# Patient Record
Sex: Male | Born: 1960
Health system: Southern US, Community
[De-identification: ages and names within clinical notes are randomized; demographics above are authoritative.]

## PROBLEM LIST (undated history)

## (undated) DIAGNOSIS — R0683 Snoring: Secondary | ICD-10-CM

## (undated) DIAGNOSIS — K219 Gastro-esophageal reflux disease without esophagitis: Secondary | ICD-10-CM

## (undated) DIAGNOSIS — M25562 Pain in left knee: Secondary | ICD-10-CM

## (undated) DIAGNOSIS — I10 Essential (primary) hypertension: Secondary | ICD-10-CM

## (undated) DIAGNOSIS — D649 Anemia, unspecified: Secondary | ICD-10-CM

## (undated) DIAGNOSIS — M2550 Pain in unspecified joint: Secondary | ICD-10-CM

## (undated) DIAGNOSIS — T7840XA Allergy, unspecified, initial encounter: Secondary | ICD-10-CM

## (undated) DIAGNOSIS — L723 Sebaceous cyst: Secondary | ICD-10-CM

## (undated) DIAGNOSIS — E785 Hyperlipidemia, unspecified: Secondary | ICD-10-CM

## (undated) DIAGNOSIS — Z9109 Other allergy status, other than to drugs and biological substances: Secondary | ICD-10-CM

## (undated) DIAGNOSIS — M25561 Pain in right knee: Secondary | ICD-10-CM

## (undated) DIAGNOSIS — M549 Dorsalgia, unspecified: Secondary | ICD-10-CM

## (undated) HISTORY — PX: KNEE SURGERY: SHX244

## (undated) HISTORY — DX: Pain in left knee: M25.562

## (undated) HISTORY — DX: Hyperlipidemia, unspecified: E78.5

## (undated) HISTORY — PX: UPPER GASTROINTESTINAL ENDOSCOPY: SHX188

## (undated) HISTORY — DX: Anemia, unspecified: D64.9

## (undated) HISTORY — DX: Dorsalgia, unspecified: M54.9

## (undated) HISTORY — DX: Gastro-esophageal reflux disease without esophagitis: K21.9

## (undated) HISTORY — DX: Pain in right knee: M25.561

## (undated) HISTORY — PX: APPENDECTOMY: SHX54

## (undated) HISTORY — DX: Allergy, unspecified, initial encounter: T78.40XA

## (undated) HISTORY — DX: Pain in unspecified joint: M25.50

## (undated) HISTORY — DX: Other allergy status, other than to drugs and biological substances: Z91.09

## (undated) HISTORY — DX: Sebaceous cyst: L72.3

---

## 1997-09-01 ENCOUNTER — Emergency Department (HOSPITAL_COMMUNITY): Admission: EM | Admit: 1997-09-01 | Discharge: 1997-09-01 | Payer: Self-pay | Admitting: Emergency Medicine

## 1997-09-10 ENCOUNTER — Emergency Department (HOSPITAL_COMMUNITY): Admission: EM | Admit: 1997-09-10 | Discharge: 1997-09-10 | Payer: Self-pay | Admitting: Emergency Medicine

## 1997-09-27 ENCOUNTER — Encounter: Admission: RE | Admit: 1997-09-27 | Discharge: 1997-12-26 | Payer: Self-pay | Admitting: *Deleted

## 1998-02-15 ENCOUNTER — Emergency Department (HOSPITAL_COMMUNITY): Admission: EM | Admit: 1998-02-15 | Discharge: 1998-02-15 | Payer: Self-pay

## 1999-06-08 ENCOUNTER — Emergency Department (HOSPITAL_COMMUNITY): Admission: EM | Admit: 1999-06-08 | Discharge: 1999-06-09 | Payer: Self-pay | Admitting: Emergency Medicine

## 1999-06-13 ENCOUNTER — Encounter: Payer: Self-pay | Admitting: Emergency Medicine

## 1999-06-13 ENCOUNTER — Emergency Department (HOSPITAL_COMMUNITY): Admission: EM | Admit: 1999-06-13 | Discharge: 1999-06-13 | Payer: Self-pay

## 1999-06-22 ENCOUNTER — Emergency Department (HOSPITAL_COMMUNITY): Admission: EM | Admit: 1999-06-22 | Discharge: 1999-06-22 | Payer: Self-pay | Admitting: Emergency Medicine

## 1999-06-22 ENCOUNTER — Encounter: Payer: Self-pay | Admitting: Emergency Medicine

## 2001-12-26 ENCOUNTER — Emergency Department (HOSPITAL_COMMUNITY): Admission: EM | Admit: 2001-12-26 | Discharge: 2001-12-26 | Payer: Self-pay | Admitting: Emergency Medicine

## 2002-01-01 HISTORY — PX: SPINE SURGERY: SHX786

## 2002-03-16 ENCOUNTER — Encounter: Payer: Self-pay | Admitting: Neurosurgery

## 2002-03-16 ENCOUNTER — Inpatient Hospital Stay (HOSPITAL_COMMUNITY): Admission: EM | Admit: 2002-03-16 | Discharge: 2002-03-18 | Payer: Self-pay | Admitting: Emergency Medicine

## 2002-05-24 ENCOUNTER — Emergency Department (HOSPITAL_COMMUNITY): Admission: EM | Admit: 2002-05-24 | Discharge: 2002-05-24 | Payer: Self-pay | Admitting: *Deleted

## 2002-05-25 ENCOUNTER — Emergency Department (HOSPITAL_COMMUNITY): Admission: EM | Admit: 2002-05-25 | Discharge: 2002-05-25 | Payer: Self-pay | Admitting: Emergency Medicine

## 2002-07-25 ENCOUNTER — Encounter: Payer: Self-pay | Admitting: Emergency Medicine

## 2002-07-25 ENCOUNTER — Emergency Department (HOSPITAL_COMMUNITY): Admission: EM | Admit: 2002-07-25 | Discharge: 2002-07-25 | Payer: Self-pay | Admitting: Emergency Medicine

## 2002-08-14 ENCOUNTER — Emergency Department (HOSPITAL_COMMUNITY): Admission: EM | Admit: 2002-08-14 | Discharge: 2002-08-14 | Payer: Self-pay | Admitting: Emergency Medicine

## 2002-08-15 ENCOUNTER — Encounter: Payer: Self-pay | Admitting: Emergency Medicine

## 2002-08-17 ENCOUNTER — Encounter: Payer: Self-pay | Admitting: Surgery

## 2002-08-17 ENCOUNTER — Ambulatory Visit (HOSPITAL_COMMUNITY): Admission: RE | Admit: 2002-08-17 | Discharge: 2002-08-17 | Payer: Self-pay | Admitting: Surgery

## 2002-08-27 ENCOUNTER — Encounter: Payer: Self-pay | Admitting: *Deleted

## 2002-08-27 ENCOUNTER — Emergency Department (HOSPITAL_COMMUNITY): Admission: EM | Admit: 2002-08-27 | Discharge: 2002-08-27 | Payer: Self-pay | Admitting: Emergency Medicine

## 2003-02-11 ENCOUNTER — Emergency Department (HOSPITAL_COMMUNITY): Admission: EM | Admit: 2003-02-11 | Discharge: 2003-02-12 | Payer: Self-pay | Admitting: Emergency Medicine

## 2003-03-15 ENCOUNTER — Emergency Department (HOSPITAL_COMMUNITY): Admission: EM | Admit: 2003-03-15 | Discharge: 2003-03-16 | Payer: Self-pay | Admitting: Emergency Medicine

## 2008-06-23 ENCOUNTER — Ambulatory Visit (HOSPITAL_COMMUNITY): Admission: RE | Admit: 2008-06-23 | Discharge: 2008-06-23 | Payer: Self-pay | Admitting: Gastroenterology

## 2009-05-05 ENCOUNTER — Encounter: Admission: RE | Admit: 2009-05-05 | Discharge: 2009-05-05 | Payer: Self-pay | Admitting: Family Medicine

## 2009-05-12 ENCOUNTER — Encounter: Admission: RE | Admit: 2009-05-12 | Discharge: 2009-05-12 | Payer: Self-pay | Admitting: Neurosurgery

## 2009-05-26 ENCOUNTER — Encounter: Admission: RE | Admit: 2009-05-26 | Discharge: 2009-05-26 | Payer: Self-pay | Admitting: Neurosurgery

## 2010-05-19 NOTE — H&P (Signed)
NAME:  Matthew Garner, Matthew Garner NO.:  [id number]   MEDICAL RECORD NO.:  [id number]                   PATIENT TYPE:  INP   LOCATION:  1831                                 FACILITY:  [facility]   PHYSICIAN:  Hewitt Shorts, M.D.            DATE OF BIRTH:  01-Jul-1960   DATE OF ADMISSION:  03/16/2002  DATE OF DISCHARGE:                                HISTORY & PHYSICAL   HISTORY OF PRESENT ILLNESS:  The patient is a 50 year old right-handed man  who was sent by [facility] to the emergency room for  evaluation of a right L4-5 lumbar disk herniation.   The patient reports that he injured his back in 1999 where he was treated  apparently in the [facility] Emergency Room.  Two months ago he had  pain develop into his right lower extremity. The pain has persisted and  become incapacitating.  He was undergoing evaluation last week at [facility].  He was given a Medrol prescription and  underwent MRI scan which revealed a right L4-5 lumbar disk herniation.  He  was in the office today seeing Dr. Kevan Ny and she had him transferred to the  [facility] emergency room by ambulance for evaluation.  The patient  was seen by Dr. Carren Rang, who requested neurosurgical consultation.  The patient complains of pain in the right side of his low back extending  into the lateral right thigh.  He describes cramping in the right lower  extremity musculature, numbness in his right testicle.   PAST MEDICAL HISTORY:  No history of hypertension, myocardial infarction,  cancer, stroke, diabetes, peptic ulcer disease, or lung disease.   PAST SURGICAL HISTORY:  He underwent an appendectomy in 1982.   MEDICATIONS:  He denies outside medications.  He is on no regular  medications but was prescribed medications last week including Medrol.   FAMILY HISTORY:  His father died at age 55, he really is unsure of the  causes.  His  mother is healthy at age 57.   SOCIAL HISTORY:  The patient is from [place].  He was married last summer,  but his wife remains in [place].  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  He does not smoke or drink alcohol beverages.   REVIEW OF SYSTEMS:  Review of systems is notable for that described in the  history of present illness and past medical history, but is otherwise  unremarkable.   PHYSICAL EXAMINATION:  GENERAL:  The patient is a somewhat obese man in  obvious pain and discomfort.  VITAL SIGNS:  Temperature 97.3, pulse 54, blood pressure 100/52, respiratory  rate 24.  LUNGS:  Clear to auscultation.  He has symmetric respiratory excursion.  HEART:  Regular rate and rhythm.  Normal S1 and S2.  There are no murmurs.  ABDOMEN:  Soft, nondistended.  Bowel sounds are present.  EXTREMITIES:  Extremity examination shows no clubbing, cyanosis, or edema.  MUSCULOSKELETAL:  Examination shows significant tenderness of the patient  diffusely in the lumbar region.  There is not any specific point tenderness.  Mobility in the lumbar spine was not testable due to the extent of his  discomfort.  NEUROLOGIC:  Examination shows a motor exam with 5/5 strength with  dorsiflexion, plantar flexion in the extensor hallicis longus bilaterally.  Sensation is intact to pinprick in the lower extremities.  Reflexes are  minimal in the quadriceps and gastrocnemius; they are symmetrical  bilaterally.  Toes are downgoing bilaterally.  Gait and stance were not  testable.   LABORATORY DATA:  Report of his MRI describes right L4-5 lumbar disk  herniation, unfortunately the films are not yet available.   IMPRESSION:  Acute right lumbar radiculopathy to a right L4-5 lumbar disk  herniation.   PLAN:  The patient will be admitted.  He was discussed alternatives of  continued symptomatic treatment versus surgical intervention.  Clearly he is  disabled by this and wishes to go ahead with surgery.  We  have recommended a  right L4-5 lumbar laminotomy and microdiskectomy.  It is discussed with both  the patient and his friend, Amiel, who was present.  The nature of the  procedure, the nature of his condition and the indications for surgery.  We  discussed given this surgery, his hospitalization and recuperation, the  limitations postoperatively and risks for infection, bleeding, possibility  of a transfusion, the risk of nerve dysfunction, pain, weakness, numbness,  and paresthesias.  The risk of recurrence of disk fractures possibly from  the surgery, and anesthetic risks of myocardial infarction, stroke,  pneumonia and death.  Having discussed all of this he wishes to go ahead  with surgery and is admitted for such.                                               Hewitt Shorts, M.D.    RWN/MEDQ  D:  03/16/2002  T:  03/16/2002  Job:  [id number]

## 2010-05-19 NOTE — Discharge Summary (Signed)
   NAME:  Matthew Garner, Matthew Garner NO.:  [id number]   MEDICAL RECORD NO.:  [id number]                   PATIENT TYPE:  INP   LOCATION:  3041                                 FACILITY:  [facility]   PHYSICIAN:  Hewitt Shorts, M.D.            DATE OF BIRTH:  April 12, 1960   DATE OF ADMISSION:  03/16/2002  DATE OF DISCHARGE:  03/18/2002                                 DISCHARGE SUMMARY   <.ADMISSION HISTORY AND PHYSICAL EXAMINATION/>  The patient is a 50 year old man who had presented with an acute right  lumbar radiculopathy secondary to a large right L4-5 lumbar disk herniation.  Decision was made to proceed with elective laminotomy and microdiskectomy.   The patient was seen in the emergency room.  His symptoms had begun two  months earlier and had become increasingly incapacitating with low back pain  extending into the right thigh and testicle.  General examination was  unremarkable other than for tenderness in the low back region.  His strength  was intact.   HOSPITAL COURSE:  .  The patient was admitted and taken to surgery for a right L4-5 lumbar  laminotomy and microdiskectomy.  He has done well following surgery with  excellent relief in his radicular pain.  His wound is healing well.  He is  afebrile.  He is up and ambulating in the halls and he is being discharged  to home with instructions regarding wound care and activity.   FOLLOWUP:  He is to return to my office in three weeks for followup or  sooner if he has increased difficulties.   DISCHARGE MEDICATIONS:  He has been given a prescription for Vicodin one to  two tablets p.o. q.i.d. p.r.n. pain -- 30 tablets were prescribed, no  refills -- and also was advised to use Aleve two tablets b.i.d. for the  first week and then p.r.n. thereafter.   DISCHARGE DIAGNOSES:  1. Lumbar disk herniation.  2. Lumbar spondylosis.  3. Lumbar degenerative disk disease.  4. Lumbar radiculopathy.                                    Hewitt Shorts, M.D.    RWN/MEDQ  D:  03/18/2002  T:  03/20/2002  Job:  [id number]

## 2010-05-19 NOTE — Op Note (Signed)
NAME:  Matthew Garner, PRESSLEY NO.:  [id number]   MEDICAL RECORD NO.:  [id number]                   PATIENT TYPE:  INP   LOCATION:  3041                                 FACILITY:  [facility]   PHYSICIAN:  Hewitt Shorts, M.D.            DATE OF BIRTH:  Mar 22, 1960   DATE OF PROCEDURE:  03/16/2002  DATE OF DISCHARGE:                                 OPERATIVE REPORT   PREOPERATIVE DIAGNOSIS:  Right L4-5 lumbar disk herniation, degenerative  disk disease, spondylosis and radiculopathy.   POSTOPERATIVE DIAGNOSIS:  Right L4-5 lumbar disk herniation, degenerative  disk disease, spondylosis and radiculopathy.   OPERATION PERFORMED:  Right L4-5 lumbar laminotomy and microdiskectomy with  microdissection.   SURGEON:  Hewitt Shorts, M.D.   ANESTHESIA:  General endotracheal.   INDICATIONS FOR PROCEDURE:  The patient is a 50 year old man who presented  with an acute right lumbar radiculopathy and was found by MRI scan to have a  large right L4-5 lumbar disk herniation.  Decision made to proceed with  elective laminotomy and microdiskectomy.   DESCRIPTION OF PROCEDURE:  The patient was brought to the operating room and  placed under general endotracheal anesthesia.  Patient was turned to a prone  position, lumbar region was prepped with Betadine soap and solution and  draped in sterile fashion.  The midline was infiltrated with local  anesthetic with epinephrine.  An x-ray was taken and the L4-5 level  identified and then a midline incision made and carried down to the  subcutaneous tissue.  Bipolar cautery and electrocautery were used to  maintain hemostasis.  Dissection was carried down to the lumbar fascia which  was incised on the right side of the midline and the paraspinal muscles  dissected from the spinous process and lamina in subperiosteal fashion.  The  L4-5 interlaminar space was identified and an x-ray was taken to confirm the  localization and  then the laminotomy was performed using the [facility] Max drill  and Kerrison punches.  The operating microscope was draped and brought into  the field to provide additional magnification, illumination and  visualization and the remainder of the diskectomy was performed using  microdissection and microsurgical technique.   The ligamentum flavum was carefully removed and the underlying thecal sac  and the right L5 nerve root were identified.  These structures were gently  retracted medially exposing the large disk herniation.  The diskectomy was  begun with removal of a fragment of disk pressing directly on the nerve root  and we were then able to enter into the disk space and proceed with a  thorough diskectomy removing all loose fragments of disk material from both  the epidural and the disk space and thereby decompressing the thecal sac and  nerve root.  Once the diskectomy was completed and all loose fragments were  removed and the structures decompressed, the wound was  irrigated with  antibiotic solution and checked for hemostasis which was established with  the use of Gelfoam soaked in thrombin.  All Gelfoam was removed prior to  closure.  Once hemostasis was established, 2 ml of fentanyl and 80 mg of  Depo-Medrol were infused into the epidural space and then we proceeded with  closure.  The deep fascia was closed with interrupted undyed 1 Vicryl  suture, the subcutaneous and subcuticular layer closed using inverted  interrupted 2-0 undyed Vicryl sutures and 4-0 undyed Vicryl sutures and then  the skin edges were approximated with Dermabond.  The patient tolerated the procedure well.  The estimated blood loss was 75  cc.  Sponge, needle and instrument counts were correct.  Following surgery,  the patient was turned back to a supine position to be reversed from  anesthetic, extubated and transferred to recovery room for further care.                                                 Hewitt Shorts, M.D.    RWN/MEDQ  D:  03/16/2002  T:  03/17/2002  Job:  [id number]

## 2010-12-12 ENCOUNTER — Ambulatory Visit
Admission: RE | Admit: 2010-12-12 | Discharge: 2010-12-12 | Disposition: A | Discharge status: Home / Self Care | Payer: Worker's Compensation | Source: Ambulatory Visit | Attending: Internal Medicine | Admitting: Internal Medicine

## 2010-12-12 ENCOUNTER — Other Ambulatory Visit: Payer: Self-pay | Admitting: Internal Medicine

## 2010-12-12 ENCOUNTER — Other Ambulatory Visit: Payer: Self-pay

## 2010-12-12 DIAGNOSIS — M25572 Pain in left ankle and joints of left foot: Secondary | ICD-10-CM

## 2011-01-09 ENCOUNTER — Ambulatory Visit (INDEPENDENT_AMBULATORY_CARE_PROVIDER_SITE_OTHER): Payer: 59 | Admitting: Emergency Medicine

## 2011-01-09 DIAGNOSIS — E119 Type 2 diabetes mellitus without complications: Secondary | ICD-10-CM

## 2011-01-09 DIAGNOSIS — E782 Mixed hyperlipidemia: Secondary | ICD-10-CM

## 2011-02-02 ENCOUNTER — Emergency Department (HOSPITAL_COMMUNITY)
Admission: EM | Admit: 2011-02-02 | Discharge: 2011-02-02 | Disposition: A | Discharge status: Home / Self Care | Payer: No Typology Code available for payment source | Attending: Emergency Medicine | Admitting: Emergency Medicine

## 2011-02-02 ENCOUNTER — Emergency Department (HOSPITAL_COMMUNITY): Payer: No Typology Code available for payment source

## 2011-02-02 ENCOUNTER — Encounter (HOSPITAL_COMMUNITY): Payer: Self-pay | Admitting: Family Medicine

## 2011-02-02 DIAGNOSIS — F411 Generalized anxiety disorder: Secondary | ICD-10-CM | POA: Insufficient documentation

## 2011-02-02 DIAGNOSIS — E119 Type 2 diabetes mellitus without complications: Secondary | ICD-10-CM | POA: Insufficient documentation

## 2011-02-02 DIAGNOSIS — Y92410 Unspecified street and highway as the place of occurrence of the external cause: Secondary | ICD-10-CM | POA: Insufficient documentation

## 2011-02-02 DIAGNOSIS — R110 Nausea: Secondary | ICD-10-CM | POA: Insufficient documentation

## 2011-02-02 DIAGNOSIS — T1490XA Injury, unspecified, initial encounter: Secondary | ICD-10-CM | POA: Insufficient documentation

## 2011-02-02 DIAGNOSIS — R51 Headache: Secondary | ICD-10-CM | POA: Insufficient documentation

## 2011-02-02 DIAGNOSIS — L723 Sebaceous cyst: Secondary | ICD-10-CM | POA: Insufficient documentation

## 2011-02-02 DIAGNOSIS — R42 Dizziness and giddiness: Secondary | ICD-10-CM | POA: Insufficient documentation

## 2011-02-02 MED ORDER — LORAZEPAM 1 MG PO TABS
1.0000 mg | ORAL_TABLET | Freq: Once | ORAL | Status: AC
  Administered 2011-02-02: 1 mg via ORAL
  Filled 2011-02-02: qty 1

## 2011-02-02 MED ORDER — OXYCODONE-ACETAMINOPHEN 5-325 MG PO TABS
1.0000 | ORAL_TABLET | Freq: Once | ORAL | Status: AC
  Administered 2011-02-02: 1 via ORAL
  Filled 2011-02-02: qty 1

## 2011-02-02 MED ORDER — ONDANSETRON 8 MG PO TBDP
8.0000 mg | ORAL_TABLET | Freq: Once | ORAL | Status: AC
  Administered 2011-02-02: 8 mg via ORAL
  Filled 2011-02-02: qty 1

## 2011-02-02 MED ORDER — DIAZEPAM 5 MG PO TABS: 5.0000 mg | ORAL_TABLET | Freq: Two times a day (BID) | ORAL | Status: AC
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
## 2011-02-02 NOTE — ED Notes (Signed)
Pt returned from CT °

## 2011-02-03 NOTE — ED Provider Notes (Signed)
Medical screening examination/treatment/procedure(s) were conducted as a shared visit with non-physician practitioner(s) and myself.  I personally evaluated the patient during the encounter   Nael Petrosyan, MD 02/03/11 0130 

## 2011-03-22 ENCOUNTER — Other Ambulatory Visit: Payer: Self-pay | Admitting: Family Medicine

## 2011-03-22 MED ORDER — METFORMIN HCL 500 MG PO TABS: 500.0000 mg | ORAL_TABLET | Freq: Every day | ORAL | Status: DC

## 2011-05-01 ENCOUNTER — Ambulatory Visit (INDEPENDENT_AMBULATORY_CARE_PROVIDER_SITE_OTHER): Payer: 59 | Admitting: Emergency Medicine

## 2011-05-01 ENCOUNTER — Encounter: Payer: Self-pay | Admitting: Emergency Medicine

## 2011-05-01 VITALS — BP 109/73 | HR 67 | Temp 97.7°F | Resp 16 | Ht 63.5 in | Wt 275.4 lb

## 2011-05-01 LAB — POCT URINALYSIS DIPSTICK
Bilirubin, UA: NEGATIVE
Blood, UA: NEGATIVE
Ketones, UA: NEGATIVE
Leukocytes, UA: NEGATIVE
Spec Grav, UA: >=1.03
pH, UA: 5.5

## 2011-05-01 LAB — GLUCOSE, POCT (MANUAL RESULT ENTRY): POC Glucose: 100

## 2011-05-01 MED ORDER — METFORMIN HCL 500 MG PO TABS: 500.0000 mg | ORAL_TABLET | Freq: Every day | ORAL | Status: DC

## 2011-05-01 NOTE — Progress Notes (Signed)
  Subjective:    Patient ID: Matthew Garner, male    DOB: 1960-09-22, 51 y.o.   MRN: [id number]  HPI this is a followup visit for an overweight [place] patient in for followup of his diabetes. He continues to gain weight. He has been taking his diabetes medication but does not check his sugars at home. He is not exercising.    Review of Systems he has no chest pain no problems with his feet no new symptoms have developed.     Objective:   Physical Exam HEENT exam is unremarkable. His neck is supple. Chest is clear. Heart regular rate no murmurs rubs or gallops. His feet are normal with normal sensation and pulses.        Results for orders placed in visit on 05/01/11  GLUCOSE, POCT (MANUAL RESULT ENTRY)      Component Value Range   POC Glucose 100    POCT GLYCOSYLATED HEMOGLOBIN (HGB A1C)      Component Value Range   Hemoglobin A1C 6.2     Assessment & Plan:  His sugar and blood pressure are both good. Dilated eyes and start exercising and get his weight down and be more diligent about his diet

## 2011-05-04 ENCOUNTER — Telehealth: Payer: Self-pay

## 2011-05-04 MED ORDER — METFORMIN HCL 500 MG PO TABS: 500.0000 mg | ORAL_TABLET | Freq: Every day | ORAL | Status: DC

## 2011-05-04 NOTE — Telephone Encounter (Signed)
.  [facility] The patient called asking if his Metformin Rx had been sent to [facility] on [address].  The system does not show that medication was sent. Please review and call patient at [phone].

## 2011-05-04 NOTE — Telephone Encounter (Signed)
Please advise patient that I have sent the Rx to [facility].  Dr. Cleta Alberts had rx'd it, but sent it to [facility].

## 2011-05-05 NOTE — Telephone Encounter (Signed)
LMOM THAT RX WAS SENT INTO [facility]

## 2011-05-06 ENCOUNTER — Ambulatory Visit (INDEPENDENT_AMBULATORY_CARE_PROVIDER_SITE_OTHER): Payer: 59 | Admitting: Emergency Medicine

## 2011-05-06 VITALS — BP 113/72 | HR 62 | Temp 98.2°F | Resp 20 | Ht 64.0 in | Wt 275.0 lb

## 2011-05-06 DIAGNOSIS — N509 Disorder of male genital organs, unspecified: Secondary | ICD-10-CM

## 2011-05-06 DIAGNOSIS — N5089 Other specified disorders of the male genital organs: Secondary | ICD-10-CM

## 2011-05-06 DIAGNOSIS — R361 Hematospermia: Secondary | ICD-10-CM

## 2011-05-06 LAB — POCT CBC
Granulocyte percent: 56.6 %G (ref 37–80)
HCT, POC: 44.1 % (ref 43.5–53.7)
MCH, POC: 28.4 pg (ref 27–31.2)
MPV: 8.9 fL (ref 0–99.8)
POC Granulocyte: 3.2 (ref 2–6.9)
POC MID %: 7.4 %M (ref 0–12)
Platelet Count, POC: 307 10*3/uL (ref 142–424)
RBC: 5.03 M/uL (ref 4.69–6.13)

## 2011-05-06 LAB — PSA: PSA: 1.01 ng/mL (ref <=4.00)

## 2011-05-06 LAB — POCT UA - MICROSCOPIC ONLY
Casts, Ur, LPF, POC: NEGATIVE
Mucus, UA: NEGATIVE
Yeast, UA: NEGATIVE

## 2011-05-06 MED ORDER — DOXYCYCLINE HYCLATE 100 MG PO TABS: 100.0000 mg | ORAL_TABLET | Freq: Two times a day (BID) | ORAL | Status: AC

## 2011-05-06 NOTE — Progress Notes (Signed)
  Subjective:    Patient ID: Matthew Garner, male    DOB: 13-May-1960, 51 y.o.   MRN: [id number]  HPI patient was having intercourse with his wife last night and he developed a severe pain in his genitals and prostate area. When he ejaculated he saw a good amount blood in the ejaculate he then noticed blood coming from his meatus. He has no history of prostate problems. He denies any burning stinging or pain on urination    Review of Systems noncontributory except as relates to the present illness .     Objective:   Physical Exam examination of the genitals reveals normal penis. Testicles are descended no masses are felt. There is no hernia palpable. Rectal exam reveals a normal size prostate which is slightly tender        Assessment & Plan:  I suspect the patient ruptured a small blood vessel at the time of intercourse. He subsequently had an episode of hematospermia. We'll check a PSA urine. No STD checks. Patient has been with the same partner for 10 years.

## 2011-05-10 ENCOUNTER — Other Ambulatory Visit: Payer: Self-pay | Admitting: Emergency Medicine

## 2011-05-10 ENCOUNTER — Telehealth: Payer: Self-pay

## 2011-05-10 DIAGNOSIS — J45909 Unspecified asthma, uncomplicated: Secondary | ICD-10-CM

## 2011-05-10 MED ORDER — ALBUTEROL SULFATE HFA 108 (90 BASE) MCG/ACT IN AERS: 2.0000 | INHALATION_SPRAY | Freq: Four times a day (QID) | RESPIRATORY_TRACT | Status: DC | PRN

## 2011-05-10 MED ORDER — FLUTICASONE PROPIONATE 50 MCG/ACT NA SUSP: 2.0000 | Freq: Every day | NASAL | Status: DC

## 2011-05-10 NOTE — Telephone Encounter (Signed)
Pt calling to get lab results please call [phone]

## 2011-05-10 NOTE — Telephone Encounter (Signed)
Spoke with patient and let him know that labs were normal.

## 2011-08-28 ENCOUNTER — Ambulatory Visit (INDEPENDENT_AMBULATORY_CARE_PROVIDER_SITE_OTHER): Payer: 59 | Admitting: Emergency Medicine

## 2011-08-28 VITALS — BP 109/70 | HR 61 | Temp 97.9°F | Resp 16 | Ht 64.0 in | Wt 272.0 lb

## 2011-08-28 DIAGNOSIS — E669 Obesity, unspecified: Secondary | ICD-10-CM

## 2011-08-28 DIAGNOSIS — E119 Type 2 diabetes mellitus without complications: Secondary | ICD-10-CM

## 2011-08-28 LAB — GLUCOSE, POCT (MANUAL RESULT ENTRY): POC Glucose: 99 mg/dl (ref 70–99)

## 2011-08-28 MED ORDER — METFORMIN HCL 500 MG PO TABS: 500.0000 mg | ORAL_TABLET | Freq: Two times a day (BID) | ORAL | Status: DC

## 2011-08-28 MED ORDER — METFORMIN HCL 500 MG PO TABS: 500.0000 mg | ORAL_TABLET | Freq: Every day | ORAL | Status: DC

## 2011-08-28 NOTE — Progress Notes (Signed)
  Subjective:    Patient ID: Maximiano Lott, male    DOB: November 02, 1960, 51 y.o.   MRN: [id number]  HPI patient comes for followup of his diabetes. Apparently he has a very poor diet and has continued eat without regard to his diabetes. He overall feels well and continues to work his regular job    Review of Systems     Objective:   Physical Exam  Constitutional: He appears well-developed.  HENT:  Head: Normocephalic.  Eyes: Pupils are equal, round, and reactive to light.  Cardiovascular: Normal rate and regular rhythm.   Pulmonary/Chest: Effort normal and breath sounds normal.  Abdominal: Soft. There is no tenderness. There is no rebound.  Musculoskeletal: He exhibits no edema.  Neurological: He is alert. He displays normal reflexes.  Psychiatric: He has a normal mood and affect.   Results for orders placed in visit on 08/28/11  GLUCOSE, POCT (MANUAL RESULT ENTRY)      Component Value Range   POC Glucose 99  70 - 99 mg/dl  POCT GLYCOSYLATED HEMOGLOBIN (HGB A1C)      Component Value Range   Hemoglobin A1C 6.4            Assessment & Plan:   His sugar is not at goal. I increased his metformen to BID and told him he had to loose weight.

## 2011-09-11 ENCOUNTER — Telehealth: Payer: Self-pay

## 2011-09-11 NOTE — Telephone Encounter (Signed)
Dr Cleta Alberts increased Metformin to 2x per day, but rx was called into pharmacy for just one a day.  He needs new rx to state 2x per day.  He is running out and pharmacy will not refill.  [facility] on [address]

## 2011-09-11 NOTE — Telephone Encounter (Signed)
Called pt and advised him that new Rx had been sent to [facility], not to [facility]. Pt stated that was fine and he will go there to get it. Pt will CB if he has any trouble getting it.

## 2011-10-18 ENCOUNTER — Other Ambulatory Visit: Payer: Self-pay | Admitting: Emergency Medicine

## 2011-11-27 ENCOUNTER — Encounter: Payer: Self-pay | Admitting: Emergency Medicine

## 2011-11-27 ENCOUNTER — Ambulatory Visit: Payer: 59

## 2011-11-27 ENCOUNTER — Ambulatory Visit (INDEPENDENT_AMBULATORY_CARE_PROVIDER_SITE_OTHER): Payer: 59 | Admitting: Emergency Medicine

## 2011-11-27 VITALS — BP 122/76 | HR 65 | Temp 98.0°F | Resp 16 | Ht 65.0 in | Wt 270.0 lb

## 2011-11-27 DIAGNOSIS — Z9109 Other allergy status, other than to drugs and biological substances: Secondary | ICD-10-CM | POA: Insufficient documentation

## 2011-11-27 DIAGNOSIS — I10 Essential (primary) hypertension: Secondary | ICD-10-CM

## 2011-11-27 DIAGNOSIS — E119 Type 2 diabetes mellitus without complications: Secondary | ICD-10-CM

## 2011-11-27 DIAGNOSIS — M79669 Pain in unspecified lower leg: Secondary | ICD-10-CM

## 2011-11-27 DIAGNOSIS — M79609 Pain in unspecified limb: Secondary | ICD-10-CM

## 2011-11-27 DIAGNOSIS — E756 Lipid storage disorder, unspecified: Secondary | ICD-10-CM

## 2011-11-27 DIAGNOSIS — E785 Hyperlipidemia, unspecified: Secondary | ICD-10-CM

## 2011-11-27 HISTORY — DX: Other allergy status, other than to drugs and biological substances: Z91.09

## 2011-11-27 LAB — CBC WITH DIFFERENTIAL/PLATELET
Basophils Absolute: 0.1 10*3/uL (ref 0.0–0.1)
Eosinophils Absolute: 0.1 10*3/uL (ref 0.0–0.7)
Lymphs Abs: 2.7 10*3/uL (ref 0.7–4.0)
MCH: 29.1 pg (ref 26.0–34.0)
Neutrophils Relative %: 54 % (ref 43–77)
Platelets: 287 10*3/uL (ref 150–400)
RBC: 5.32 MIL/uL (ref 4.22–5.81)
RDW: 13.6 % (ref 11.5–15.5)
WBC: 7.6 10*3/uL (ref 4.0–10.5)

## 2011-11-27 MED ORDER — TRAMADOL HCL 50 MG PO TABS: 50.0000 mg | ORAL_TABLET | Freq: Four times a day (QID) | ORAL | Status: DC | PRN

## 2011-11-27 NOTE — Progress Notes (Signed)
  Subjective:    Patient ID: Matthew Garner, male    DOB: 1960-07-13, 51 y.o.   MRN: [id number]  HPI patient's main complaint is of a two-week history of pain in his left calf. Denies chest pain shortness of breath. He is been watching his diet. He has difficulty when he goes to medial and laid back to say prayers . He does not have any calf swelling.       Review of Systems     Objective:   Physical Exam HEENT exam is unremarkable. His neck is supple. His chest is clear.cardiac unremarkable. Abdomen is soft. There is tenderness anterior and posterior left calf. I measured the cath 12 cm below the inferior patella and then the same right compared to left. He has a negative Homans sign.  Results for orders placed in visit on 11/27/11  GLUCOSE, POCT (MANUAL RESULT ENTRY)      Component Value Range   POC Glucose 99  70 - 99 mg/dl  POCT GLYCOSYLATED HEMOGLOBIN (HGB A1C)      Component Value Range   Hemoglobin A1C 6.3         [facility] reading (PRIMARY) by  Dr. Cleta Alberts Normal   Assessment & Plan:        We will need to do a Doppler of the left calf to be sure there is not a clot. X-rays appear normal I have refilled his Ultram for pain. I called and spoke with the appointments and they will call patient for his skin to be done tomorrow afternoon prescription was written for glucometer and test strips .

## 2011-11-28 ENCOUNTER — Inpatient Hospital Stay (HOSPITAL_COMMUNITY)
Admission: RE | Admit: 2011-11-28 | Discharge: 2011-11-28 | Disposition: A | Discharge status: Home / Self Care | Payer: 59 | Source: Ambulatory Visit | Attending: Emergency Medicine | Admitting: Emergency Medicine

## 2011-11-28 ENCOUNTER — Telehealth: Payer: Self-pay | Admitting: *Deleted

## 2011-11-28 DIAGNOSIS — M79609 Pain in unspecified limb: Secondary | ICD-10-CM | POA: Insufficient documentation

## 2011-11-28 DIAGNOSIS — M7989 Other specified soft tissue disorders: Secondary | ICD-10-CM

## 2011-11-28 DIAGNOSIS — M79669 Pain in unspecified lower leg: Secondary | ICD-10-CM

## 2011-11-28 LAB — COMPREHENSIVE METABOLIC PANEL
ALT: 21 U/L (ref 0–53)
AST: 18 U/L (ref 0–37)
Alkaline Phosphatase: 77 U/L (ref 39–117)
CO2: 27 mEq/L (ref 19–32)
Sodium: 140 mEq/L (ref 135–145)
Total Bilirubin: 0.5 mg/dL (ref 0.3–1.2)
Total Protein: 7.5 g/dL (ref 6.0–8.3)

## 2011-11-28 LAB — LIPID PANEL
Cholesterol: 131 mg/dL (ref 0–200)
LDL Cholesterol: 68 mg/dL (ref 0–99)
Total CHOL/HDL Ratio: 2.6 Ratio
VLDL: 13 mg/dL (ref 0–40)

## 2011-11-28 LAB — MICROALBUMIN, URINE: Microalb, Ur: 9.15 mg/dL — ABNORMAL HIGH (ref 0.00–1.89)

## 2011-11-28 NOTE — Telephone Encounter (Signed)
Call patient on his cell phone and let him know the did not see a clot on his  Doppler. He can try either Advil or Aleve for the discomfort .

## 2011-11-28 NOTE — Telephone Encounter (Signed)
vasular lab called with preliminary report on pt he was negative for dvt.  They let pt go.

## 2011-11-28 NOTE — Telephone Encounter (Signed)
Patient advised.

## 2012-03-04 ENCOUNTER — Ambulatory Visit (INDEPENDENT_AMBULATORY_CARE_PROVIDER_SITE_OTHER): Payer: 59 | Admitting: Emergency Medicine

## 2012-03-04 ENCOUNTER — Encounter: Payer: Self-pay | Admitting: Emergency Medicine

## 2012-03-04 ENCOUNTER — Emergency Department (HOSPITAL_COMMUNITY): Payer: 59

## 2012-03-04 ENCOUNTER — Encounter (HOSPITAL_COMMUNITY): Payer: Self-pay | Admitting: *Deleted

## 2012-03-04 ENCOUNTER — Emergency Department (HOSPITAL_COMMUNITY)
Admission: EM | Admit: 2012-03-04 | Discharge: 2012-03-04 | Disposition: A | Discharge status: Home / Self Care | Payer: 59 | Attending: Emergency Medicine | Admitting: Emergency Medicine

## 2012-03-04 VITALS — BP 112/70 | HR 90 | Temp 99.0°F | Resp 18 | Ht 64.0 in | Wt 267.0 lb

## 2012-03-04 DIAGNOSIS — K219 Gastro-esophageal reflux disease without esophagitis: Secondary | ICD-10-CM

## 2012-03-04 DIAGNOSIS — Z79899 Other long term (current) drug therapy: Secondary | ICD-10-CM | POA: Insufficient documentation

## 2012-03-04 DIAGNOSIS — E119 Type 2 diabetes mellitus without complications: Secondary | ICD-10-CM | POA: Insufficient documentation

## 2012-03-04 DIAGNOSIS — Z9089 Acquired absence of other organs: Secondary | ICD-10-CM | POA: Insufficient documentation

## 2012-03-04 DIAGNOSIS — R109 Unspecified abdominal pain: Secondary | ICD-10-CM

## 2012-03-04 DIAGNOSIS — K2970 Gastritis, unspecified, without bleeding: Secondary | ICD-10-CM | POA: Insufficient documentation

## 2012-03-04 DIAGNOSIS — R110 Nausea: Secondary | ICD-10-CM

## 2012-03-04 DIAGNOSIS — R112 Nausea with vomiting, unspecified: Secondary | ICD-10-CM | POA: Insufficient documentation

## 2012-03-04 DIAGNOSIS — E785 Hyperlipidemia, unspecified: Secondary | ICD-10-CM | POA: Insufficient documentation

## 2012-03-04 DIAGNOSIS — J029 Acute pharyngitis, unspecified: Secondary | ICD-10-CM

## 2012-03-04 LAB — URINALYSIS, ROUTINE W REFLEX MICROSCOPIC
Bilirubin Urine: NEGATIVE
Glucose, UA: NEGATIVE mg/dL
Hgb urine dipstick: NEGATIVE
Leukocytes, UA: NEGATIVE
Nitrite: NEGATIVE
Protein, ur: NEGATIVE mg/dL
Specific Gravity, Urine: 1.028 (ref 1.005–1.030)
Urobilinogen, UA: 1 mg/dL (ref 0.0–1.0)
pH: 5.5 (ref 5.0–8.0)

## 2012-03-04 LAB — POCT CBC
Hemoglobin: 15.5 g/dL (ref 14.1–18.1)
Lymph, poc: 1.2 (ref 0.6–3.4)
MCHC: 32.4 g/dL (ref 31.8–35.4)
MID (cbc): 0.5 (ref 0–0.9)
MPV: 8.7 fL (ref 0–99.8)
POC Granulocyte: 7.6 — AB (ref 2–6.9)
POC MID %: 5.6 %M (ref 0–12)
Platelet Count, POC: 317 10*3/uL (ref 142–424)
RDW, POC: 13.5 %

## 2012-03-04 LAB — CBC WITH DIFFERENTIAL/PLATELET
Basophils Absolute: 0 10*3/uL (ref 0.0–0.1)
Eosinophils Relative: 1 % (ref 0–5)
HCT: 45.8 % (ref 39.0–52.0)
Lymphocytes Relative: 14 % (ref 12–46)
Lymphs Abs: 1.3 10*3/uL (ref 0.7–4.0)
MCH: 29.2 pg (ref 26.0–34.0)
MCV: 84.7 fL (ref 78.0–100.0)
Monocytes Absolute: 0.6 10*3/uL (ref 0.1–1.0)
RDW: 12.8 % (ref 11.5–15.5)
WBC: 9.5 10*3/uL (ref 4.0–10.5)

## 2012-03-04 LAB — POCT URINALYSIS DIPSTICK
Bilirubin, UA: NEGATIVE
Glucose, UA: NEGATIVE
Leukocytes, UA: NEGATIVE
Nitrite, UA: NEGATIVE
pH, UA: 5.5

## 2012-03-04 LAB — COMPREHENSIVE METABOLIC PANEL
CO2: 28 mEq/L (ref 19–32)
Calcium: 9.3 mg/dL (ref 8.4–10.5)
Creatinine, Ser: 0.75 mg/dL (ref 0.50–1.35)
GFR calc Af Amer: >90 mL/min (ref >90)
GFR calc non Af Amer: >90 mL/min (ref >90)
Glucose, Bld: 97 mg/dL (ref 70–99)

## 2012-03-04 LAB — LIPASE, BLOOD: Lipase: 10 U/L — ABNORMAL LOW (ref 11–59)

## 2012-03-04 LAB — POCT UA - MICROSCOPIC ONLY: Yeast, UA: NEGATIVE

## 2012-03-04 LAB — GLUCOSE, POCT (MANUAL RESULT ENTRY): POC Glucose: 105 mg/dl — AB (ref 70–99)

## 2012-03-04 LAB — POCT RAPID STREP A (OFFICE): Rapid Strep A Screen: NEGATIVE

## 2012-03-04 MED ORDER — HYDROCODONE-ACETAMINOPHEN 5-325 MG PO TABS: 1.0000 | ORAL_TABLET | ORAL | Status: DC | PRN

## 2012-03-04 MED ORDER — HYDROMORPHONE HCL PF 1 MG/ML IJ SOLN
1.0000 mg | Freq: Once | INTRAMUSCULAR | Status: AC
  Administered 2012-03-04: 1 mg via INTRAVENOUS
  Filled 2012-03-04: qty 1

## 2012-03-04 MED ORDER — ONDANSETRON 4 MG PO TBDP: 4.0000 mg | ORAL_TABLET | Freq: Three times a day (TID) | ORAL | Status: DC | PRN

## 2012-03-04 MED ORDER — FENTANYL CITRATE 0.05 MG/ML IJ SOLN
50.0000 ug | Freq: Once | INTRAMUSCULAR | Status: AC
  Administered 2012-03-04: 50 ug via INTRAVENOUS
  Filled 2012-03-04: qty 2

## 2012-03-04 MED ORDER — IOHEXOL 300 MG/ML  SOLN
100.0000 mL | Freq: Once | INTRAMUSCULAR | Status: AC | PRN
  Administered 2012-03-04: 100 mL via INTRAVENOUS

## 2012-03-04 MED ORDER — PANTOPRAZOLE SODIUM 40 MG IV SOLR
40.0000 mg | Freq: Once | INTRAVENOUS | Status: AC
  Administered 2012-03-04: 40 mg via INTRAVENOUS
  Filled 2012-03-04: qty 40

## 2012-03-04 MED ORDER — SODIUM CHLORIDE 0.9 % IV BOLUS (SEPSIS)
1000.0000 mL | Freq: Once | INTRAVENOUS | Status: AC
  Administered 2012-03-04: 1000 mL via INTRAVENOUS

## 2012-03-04 MED ORDER — GI COCKTAIL ~~LOC~~
30.0000 mL | Freq: Once | ORAL | Status: AC
  Administered 2012-03-04: 30 mL via ORAL
  Filled 2012-03-04: qty 30

## 2012-03-04 MED ORDER — LANSOPRAZOLE 30 MG PO CPDR: 30.0000 mg | DELAYED_RELEASE_CAPSULE | Freq: Every day | ORAL | Status: DC

## 2012-03-04 MED ORDER — ONDANSETRON 4 MG PO TBDP
8.0000 mg | ORAL_TABLET | Freq: Once | ORAL | Status: AC
  Administered 2012-03-04: 8 mg via ORAL

## 2012-03-04 MED ORDER — IOHEXOL 300 MG/ML  SOLN
50.0000 mL | Freq: Once | INTRAMUSCULAR | Status: AC | PRN
  Administered 2012-03-04: 50 mL via ORAL

## 2012-03-04 MED ORDER — ONDANSETRON HCL 4 MG/2ML IJ SOLN
4.0000 mg | Freq: Once | INTRAMUSCULAR | Status: AC
  Administered 2012-03-04: 4 mg via INTRAVENOUS
  Filled 2012-03-04: qty 2

## 2012-03-04 NOTE — Progress Notes (Signed)
  Subjective:    Patient ID: Matthew Garner, male    DOB: 1960-06-23, 52 y.o.   MRN: [id number]  HPI patient enters for his three-month followup on his diabetes mellitus. He woke up this morning and felt nauseated. He hasn't drank of fluids at work but is continuing to have worsening nausea all through today with development of abdominal pain. He has also started having fever associated with his nausea. He denies diarrhea. He denies dysuria frequency or urgency of urination. He is status post appendectomy. Apparently other members of the family have strep throat.    Review of Systems     Objective:   Physical Exam HEENT exam reveals a mildly red posterior pharynx. The neck is supple. Chest is clear to both auscultation and percussion. Diminished obese. Bowel sounds are present. There is a scar lower right abdomen. There is significant tenderness in the left lower quadrant. There is also mild tenderness in the right lower quadrant        Assessment & Plan:  White count is upper limits of normal with a left shift . He does have significant abdominal tenderness more left than right and has had an appendectomy in the past. This raises the concern about diverticulitis. Patient being referred to the emergency room. Triage was called. He was given 8 mg Zofran ODT prior to leaving.

## 2012-03-04 NOTE — ED Notes (Signed)
Call received from Dr. Cleta Alberts regarding pt. Sts he is concerned for diverticulitis. Pt has had nausea, fever, LLQ abd pain. Sent pt for IV fluids, CT scan. Pt has had CBC, strep and UA performed today. Also received 8mg  Zofran ODT.

## 2012-03-04 NOTE — ED Provider Notes (Signed)
History     CSN: [id number]  Arrival date & time 03/04/12  1758   First MD Initiated Contact with Patient 03/04/12 1924      Chief Complaint  Patient presents with  . Abdominal Pain    (Consider location/radiation/quality/duration/timing/severity/associated sxs/prior treatment) HPI Pt present with abd pain x 2 days with nausea and and occasional vomiting. Seen by PMD and referred to ED for evaluation of possible diverticulitis. Pt with prev appendectomy. Normal BM today. No fever or chills. No urinary symptoms.  Past Medical History  Diagnosis Date  . Diabetes mellitus   . GERD (gastroesophageal reflux disease)   . Hyperlipidemia     Past Surgical History  Procedure Laterality Date  . Spine surgery      History reviewed. No pertinent family history.  History  Substance Use Topics  . Smoking status: Never Smoker   . Smokeless tobacco: Not on file  . Alcohol Use: No      Review of Systems  Constitutional: Negative for fever and chills.  Respiratory: Negative for shortness of breath.   Cardiovascular: Negative for chest pain.  Gastrointestinal: Positive for nausea, vomiting and abdominal pain. Negative for diarrhea, constipation and blood in stool.  Genitourinary: Negative for hematuria and flank pain.  Musculoskeletal: Negative for back pain.  Skin: Negative for pallor and rash.  Neurological: Negative for weakness and numbness.  All other systems reviewed and are negative.    Allergies  Review of patient's allergies indicates no known allergies.  Home Medications   Current Outpatient Rx  Name  Route  Sig  Dispense  Refill  . losartan (COZAAR) 50 MG tablet   Oral   Take 50 mg by mouth every morning.          . metFORMIN (GLUCOPHAGE) 500 MG tablet   Oral   Take 500 mg by mouth 2 (two) times daily with a meal. Needs office visit for next refill         . simvastatin (ZOCOR) 20 MG tablet   Oral   Take 20 mg by mouth every morning.          .  traMADol (ULTRAM) 50 MG tablet   Oral   Take 50 mg by mouth every 6 (six) hours as needed (pain).         Marland Kitchen HYDROcodone-acetaminophen (NORCO) 5-325 MG per tablet   Oral   Take 1 tablet by mouth every 4 (four) hours as needed for pain.   20 tablet   0   . lansoprazole (PREVACID) 30 MG capsule   Oral   Take 1 capsule (30 mg total) by mouth daily.   30 capsule   0   . ondansetron (ZOFRAN ODT) 4 MG disintegrating tablet   Oral   Take 1 tablet (4 mg total) by mouth every 8 (eight) hours as needed for nausea.   20 tablet   0     BP 115/66  Pulse 81  Temp(Src) 98.8 F (37.1 C) (Oral)  Resp 20  SpO2 96%  Physical Exam  Nursing note and vitals reviewed. Constitutional: He is oriented to person, place, and time. He appears well-developed and well-nourished. No distress.  HENT:  Head: Normocephalic and atraumatic.  Mouth/Throat: Oropharynx is clear and moist.  Eyes: EOM are normal. Pupils are equal, round, and reactive to light.  Neck: Normal range of motion. Neck supple.  Cardiovascular: Normal rate and regular rhythm.   Pulmonary/Chest: Effort normal and breath sounds normal. No respiratory distress. He has  no wheezes. He has no rales.  Abdominal: Soft. Bowel sounds are normal. He exhibits no distension and no mass. There is tenderness (TTP in epigastrum and LLQ. No rebound or guarding. ). There is no rebound and no guarding.  Musculoskeletal: Normal range of motion. He exhibits no edema and no tenderness.  Neurological: He is alert and oriented to person, place, and time.  Moves all ext with no deficits.   Skin: Skin is warm and dry. No rash noted. No erythema.  Psychiatric: He has a normal mood and affect. His behavior is normal.    ED Course  Procedures (including critical care time)  Labs Reviewed  CBC WITH DIFFERENTIAL - Abnormal; Notable for the following:    Neutrophils Relative 79 (*)    All other components within normal limits  LIPASE, BLOOD - Abnormal;  Notable for the following:    Lipase 10 (*)    All other components within normal limits  URINALYSIS, ROUTINE W REFLEX MICROSCOPIC - Abnormal; Notable for the following:    Ketones, ur TRACE (*)    All other components within normal limits  COMPREHENSIVE METABOLIC PANEL   Ct Abdomen Pelvis W Contrast  03/04/2012  *RADIOLOGY REPORT*  Clinical Data: Abdominal pain, lower pelvic pain.  diverticulitis.  CT ABDOMEN AND PELVIS WITH CONTRAST  Technique:  Multidetector CT imaging of the abdomen and pelvis was performed following the standard protocol during bolus administration of intravenous contrast.  Contrast: OMNIPAQUE IOHEXOL 300 MG/ML  SOLN, 50mL OMNIPAQUE IOHEXOL 300 MG/ML  SOLN  Comparison: Lumbar MRI 05/05/2009, abdominal CT 03/16/2003  Findings: Lung bases are clear.  No pericardial fluid.  No focal hepatic lesion.  The gallbladder, pancreas, spleen, adrenal glands, and kidneys are normal.  There is a nonobstructing 2 mm calculus in the mid right kidney (image 40).  Stomach, small bowel, and cecum are normal.  Appendix is not identified.  Several scattered diverticula of the descending sigmoid colon without evidence of acute inflammation.  Abdominal aorta normal caliber.  No retroperitoneal periportal lymphadenopathy.  There is a small calcified nodular density within the central mesentery measuring 11 mm (image 49).  This is not changed from comparison exam.  No free fluid the pelvis.  No distal ureteral stones or bladder stones.  The prostate gland is normal. Review of  bone windows demonstrates no aggressive osseous lesions.  IMPRESSION:  1.  No evidence of diverticulitis.  Minimal diverticulosis of the right colon and sigmoid colon. 2.  No signs of appendicitis. 3.  Small nonobstructing right renal calculus.  4.  Small calcified central mesenteric nodule is not changed from 2005 and therefore of unlikely clinical significance.   Original Report Authenticated By: Genevive Bi, M.D.      1.  Gastritis       MDM   Pt states some improvement with meds. CT no acute process. On re-exam pain seems concentrated in epigastrum and LUQ. Think likely gastritis. Pt admits to NSAID use and not taking PPI. Will treat, start PPI and refer to [facility] MD.        Loren Racer, MD 03/04/12 2311

## 2012-03-11 ENCOUNTER — Other Ambulatory Visit: Payer: Self-pay | Admitting: Emergency Medicine

## 2012-03-11 DIAGNOSIS — R1013 Epigastric pain: Secondary | ICD-10-CM

## 2012-03-12 ENCOUNTER — Encounter: Payer: Self-pay | Admitting: Gastroenterology

## 2012-03-28 ENCOUNTER — Encounter: Payer: Self-pay | Admitting: Gastroenterology

## 2012-03-28 ENCOUNTER — Ambulatory Visit (INDEPENDENT_AMBULATORY_CARE_PROVIDER_SITE_OTHER): Payer: 59 | Admitting: Gastroenterology

## 2012-03-28 VITALS — BP 110/70 | HR 65 | Ht 65.0 in | Wt 269.8 lb

## 2012-03-28 DIAGNOSIS — R112 Nausea with vomiting, unspecified: Secondary | ICD-10-CM

## 2012-03-28 DIAGNOSIS — K7689 Other specified diseases of liver: Secondary | ICD-10-CM

## 2012-03-28 DIAGNOSIS — R1013 Epigastric pain: Secondary | ICD-10-CM

## 2012-03-28 DIAGNOSIS — K760 Fatty (change of) liver, not elsewhere classified: Secondary | ICD-10-CM

## 2012-03-28 NOTE — Progress Notes (Addendum)
History of Present Illness: This is a 52 year old male who relates a very long history of intermittent nocturnal vomiting and epigastric pain. He states his symptoms aren't confined to recumbency. He states he underwent upper endoscopy in [place] in 2001 but he is unsure of the findings. He has been evaluated by Dr. Loreta Ave and treated with several medications including Nexium. The patient relates that he has had no response to these medications. Her symptoms have worsened over the past several weeks. He notes occasional episodes of loose stools every 2-3 weeks but generally his bowel movements are normal. Denies weight loss, constipation, diarrhea, change in stool caliber, melena, hematochezia, dysphagia, chest pain.  Review of Systems: Pertinent positive and negative review of systems were noted in the above HPI section. All other review of systems were otherwise negative.  Current Medications, Allergies, Past Medical History, Past Surgical History, Family History and Social History were reviewed in [employer] record.  Physical Exam: General: Well developed , well nourished, overweight, no acute distress Head: Normocephalic and atraumatic Eyes:  sclerae anicteric, EOMI Ears: Normal auditory acuity Mouth: No deformity or lesions Neck: Supple, no masses or thyromegaly Lungs: Clear throughout to auscultation Heart: Regular rate and rhythm; no murmurs, rubs or bruits Abdomen: Soft, non tender and non distended. No masses, hepatosplenomegaly or hernias noted. Normal Bowel sounds Musculoskeletal: Symmetrical with no gross deformities  Skin: No lesions on visible extremities Pulses:  Normal pulses noted Extremities: No clubbing, cyanosis, edema or deformities noted Neurological: Alert oriented x 4, grossly nonfocal Cervical Nodes:  No significant cervical adenopathy Inguinal Nodes: No significant inguinal adenopathy Psychological:  Alert and cooperative. Normal mood and  affect  Assessment and Recommendations:  1. Nausea, vomiting, epigastric pain. R/O GERD, ulcer. Patient has been treated with several PPIs without a good response. Request records from Dr. Loreta Ave. The risks, benefits, and alternatives to endoscopy with possible biopsy and possible dilation were discussed with the patient and they consent to proceed.   2. Colorectal cancer screening, average risk. Colonoscopy at age 25.  Records received from Dr. Kenna Gilbert office and reviewed. The patient was treated for GERD with  Dexilant, constipation, fatty liver and an anal fissure leading to rectal bleeding. An abdominal ultrasound performed in June 2010 and showed probable fatty liver and no other abnormalities.  A [facility] HIDA scan in June 2010 showed a 67% GB ejection fraction.

## 2012-03-28 NOTE — Patient Instructions (Addendum)
You have been scheduled for an endoscopy with propofol. Please follow written instructions given to you at your visit today. If you use inhalers (even only as needed), please bring them with you on the day of your procedure.  We have requested your records from Dr. Loreta Ave and Dr. Russella Dar will review them                                                We are excited to introduce MyChart, a new best-in-class service that provides you online access to important information in your electronic medical record. We want to make it easier for you to view your health information - all in one secure location - when and where you need it. We expect MyChart will enhance the quality of care and service we provide.  When you register for MyChart, you can:    View your test results.    Request appointments and receive appointment reminders via email.    Request medication renewals.    View your medical history, allergies, medications and immunizations.    Communicate with your physician's office through a password-protected site.    Conveniently print information such as your medication lists.  To find out if MyChart is right for you, please talk to a member of our clinical staff today. We will gladly answer your questions about this free health and wellness tool.  If you are age 50 or older and want a member of your family to have access to your record, you must provide written consent by completing a proxy form available at our office. Please speak to our clinical staff about guidelines regarding accounts for patients younger than age 50.  As you activate your MyChart account and need any technical assistance, please call the MyChart technical support line at [phone]-CHART [phone]) or email your question to [email or web address]. If you email your question(s), please include your name, a return phone number and the best time to reach you.  If you have non-urgent health-related questions, you  can send a message to our office through MyChart at [email or web address]. If you have a medical emergency, call 911.  Thank you for using MyChart as your new health and wellness resource!   MyChart licensed from [employer],  [id number]. Patents Pending.

## 2012-04-10 ENCOUNTER — Encounter: Payer: Self-pay | Admitting: Gastroenterology

## 2012-04-10 ENCOUNTER — Ambulatory Visit (AMBULATORY_SURGERY_CENTER): Payer: 59 | Admitting: Gastroenterology

## 2012-04-10 ENCOUNTER — Other Ambulatory Visit: Payer: Self-pay | Admitting: Gastroenterology

## 2012-04-10 VITALS — BP 126/76 | HR 59 | Temp 98.0°F | Resp 34 | Ht 65.0 in | Wt 269.0 lb

## 2012-04-10 DIAGNOSIS — R1013 Epigastric pain: Secondary | ICD-10-CM

## 2012-04-10 DIAGNOSIS — R112 Nausea with vomiting, unspecified: Secondary | ICD-10-CM

## 2012-04-10 LAB — GLUCOSE, CAPILLARY: Glucose-Capillary: 90 mg/dL (ref 70–99)

## 2012-04-10 MED ORDER — RANITIDINE HCL 300 MG PO CAPS: 300.0000 mg | ORAL_CAPSULE | Freq: Every evening | ORAL | Status: DC

## 2012-04-10 MED ORDER — SODIUM CHLORIDE 0.9 % IV SOLN: 500.0000 mL | INTRAVENOUS | Status: DC

## 2012-04-10 MED ORDER — ESOMEPRAZOLE MAGNESIUM 40 MG PO CPDR: 40.0000 mg | DELAYED_RELEASE_CAPSULE | Freq: Two times a day (BID) | ORAL | Status: DC

## 2012-04-10 NOTE — Progress Notes (Signed)
Patient did not experience any of the following events: a burn prior to discharge; a fall within the facility; wrong site/side/patient/procedure/implant event; or a hospital transfer or hospital admission upon discharge from the facility. (G8907) Patient did not have preoperative order for IV antibiotic SSI prophylaxis. (G8918)  

## 2012-04-10 NOTE — Patient Instructions (Addendum)
Discharge instructions given with verbal understanding. Handout on a hiatal hernia and esophagitis given. Resume previous medications. YOU HAD AN ENDOSCOPIC PROCEDURE TODAY AT THE [facility]: Refer to the procedure report that was given to you for any specific questions about what was found during the examination.  If the procedure report does not answer your questions, please call your gastroenterologist to clarify.  If you requested that your care partner not be given the details of your procedure findings, then the procedure report has been included in a sealed envelope for you to review at your convenience later.  YOU SHOULD EXPECT: Some feelings of bloating in the abdomen. Passage of more gas than usual.  Walking can help get rid of the air that was put into your GI tract during the procedure and reduce the bloating. If you had a lower endoscopy (such as a colonoscopy or flexible sigmoidoscopy) you may notice spotting of blood in your stool or on the toilet paper. If you underwent a bowel prep for your procedure, then you may not have a normal bowel movement for a few days.  DIET: Your first meal following the procedure should be a light meal and then it is ok to progress to your normal diet.  A half-sandwich or bowl of soup is an example of a good first meal.  Heavy or fried foods are harder to digest and may make you feel nauseous or bloated.  Likewise meals heavy in dairy and vegetables can cause extra gas to form and this can also increase the bloating.  Drink plenty of fluids but you should avoid alcoholic beverages for 24 hours.  ACTIVITY: Your care partner should take you home directly after the procedure.  You should plan to take it easy, moving slowly for the rest of the day.  You can resume normal activity the day after the procedure however you should NOT DRIVE or use heavy machinery for 24 hours (because of the sedation medicines used during the test).    SYMPTOMS TO REPORT  IMMEDIATELY: A gastroenterologist can be reached at any hour.  During normal business hours, 8:30 AM to 5:00 PM Monday through Friday, call [phone].  After hours and on weekends, please call the GI answering service at [phone] who will take a message and have the physician on call contact you.   Following upper endoscopy (EGD)  Vomiting of blood or coffee ground material  New chest pain or pain under the shoulder blades  Painful or persistently difficult swallowing  New shortness of breath  Fever of 100F or higher  Black, tarry-looking stools  FOLLOW UP: If any biopsies were taken you will be contacted by phone or by letter within the next 1-3 weeks.  Call your gastroenterologist if you have not heard about the biopsies in 3 weeks.  Our staff will call the home number listed on your records the next business day following your procedure to check on you and address any questions or concerns that you may have at that time regarding the information given to you following your procedure. This is a courtesy call and so if there is no answer at the home number and we have not heard from you through the emergency physician on call, we will assume that you have returned to your regular daily activities without incident.  SIGNATURES/CONFIDENTIALITY: You and/or your care partner have signed paperwork which will be entered into your electronic medical record.  These signatures attest to the fact that that  the information above on your After Visit Summary has been reviewed and is understood.  Full responsibility of the confidentiality of this discharge information lies with you and/or your care-partner.

## 2012-04-10 NOTE — Op Note (Signed)
[facility] [address] [place], [address]   ENDOSCOPY PROCEDURE REPORT  PATIENT: Matthew, Garner  MR#: [id number] BIRTHDATE: 1960/09/24 , 51  yrs. old GENDER: Male ENDOSCOPIST: Meryl Dare, MD, [facility] REFERRED BY:  Lesle Chris, M.D. PROCEDURE DATE:  04/10/2012 PROCEDURE:  EGD, diagnostic ASA CLASS:     Class II INDICATIONS:  Nausea.   Vomiting.   Epigastric pain. MEDICATIONS: MAC sedation, administered by CRNA and propofol (Diprivan) 170mg  IV TOPICAL ANESTHETIC: Cetacaine Spray DESCRIPTION OF PROCEDURE: After the risks benefits and alternatives of the procedure were thoroughly explained, informed consent was obtained.  The [facility] [id number] endoscope was introduced through the mouth and advanced to the second portion of the duodenum without limitations.  The instrument was slowly withdrawn as the mucosa was fully examined.  ESOPHAGUS: There was LA Class C esophagitis noted.   The esophagus was otherwise normal. STOMACH: The mucosa and folds of the stomach appeared normal. DUODENUM: The duodenal mucosa showed no abnormalities in the bulb and second portion of the duodenum.  Retroflexed views revealed a small hiatal hernia.  The scope was then withdrawn from the patient and the procedure completed.  COMPLICATIONS: There were no complications.  ENDOSCOPIC IMPRESSION: 1.   LA Class C erosive esophagitis 2.   Small hiatal hernia  RECOMMENDATIONS: 1.  Strictly follow all anti-reflux measures, 4 " bed blocks 2.   Nexium 40 mg po bid, 1 year of refills and ranitidine 300 mg hs, 1 year of refills 3.  OP follow-up in 8-10 weeks.    eSigned:  Meryl Dare, MD, [facility] 04/10/2012 3:54 PM

## 2012-04-11 ENCOUNTER — Telehealth: Payer: Self-pay | Admitting: *Deleted

## 2012-04-11 NOTE — Telephone Encounter (Signed)
  Follow up Call-  Call back number 04/10/2012  Post procedure Call Back phone  # [phone]    Permission to leave phone message Yes     Patient questions:  Do you have a fever, pain , or abdominal swelling? no Pain Score  0 *  Have you tolerated food without any problems? yes  Have you been able to return to your normal activities? yes  Do you have any questions about your discharge instructions: Diet   no Medications  no Follow up visit  no  Do you have questions or concerns about your Care? no  Actions: * If pain score is 4 or above: No action needed, pain <4.

## 2012-04-20 ENCOUNTER — Telehealth: Payer: Self-pay

## 2012-04-20 NOTE — Telephone Encounter (Signed)
PT WOULD LIKE REFILLS ON HIS MEDS PLEASE CALL [phone]

## 2012-04-20 NOTE — Telephone Encounter (Signed)
Home number magic jack customer not available.  Left message on cell to cb

## 2012-04-21 ENCOUNTER — Telehealth: Payer: Self-pay | Admitting: Family Medicine

## 2012-04-21 MED ORDER — LOSARTAN POTASSIUM 50 MG PO TABS: 50.0000 mg | ORAL_TABLET | Freq: Every morning | ORAL | Status: DC

## 2012-04-21 NOTE — Addendum Note (Signed)
Addended byCaffie Damme on: 04/21/2012 09:10 AM   Modules accepted: Orders

## 2012-04-21 NOTE — Telephone Encounter (Signed)
Sent in meds 

## 2012-04-21 NOTE — Telephone Encounter (Signed)
Sent in ,unable to reach him by phone to advise.

## 2012-04-21 NOTE — Telephone Encounter (Signed)
Patient is calling for a refill on his losartan stated that Dr Cleta Alberts for got to refill his medicine

## 2012-05-22 ENCOUNTER — Other Ambulatory Visit: Payer: Self-pay | Admitting: Emergency Medicine

## 2012-06-03 ENCOUNTER — Ambulatory Visit (INDEPENDENT_AMBULATORY_CARE_PROVIDER_SITE_OTHER): Payer: 59 | Admitting: Emergency Medicine

## 2012-06-03 ENCOUNTER — Encounter: Payer: Self-pay | Admitting: Emergency Medicine

## 2012-06-03 VITALS — BP 105/69 | HR 57 | Temp 98.0°F | Resp 16 | Ht 64.5 in | Wt 272.0 lb

## 2012-06-03 DIAGNOSIS — R0683 Snoring: Secondary | ICD-10-CM

## 2012-06-03 DIAGNOSIS — R229 Localized swelling, mass and lump, unspecified: Secondary | ICD-10-CM

## 2012-06-03 DIAGNOSIS — E669 Obesity, unspecified: Secondary | ICD-10-CM

## 2012-06-03 DIAGNOSIS — E119 Type 2 diabetes mellitus without complications: Secondary | ICD-10-CM

## 2012-06-03 NOTE — Progress Notes (Signed)
  Subjective:    Patient ID: Matthew Garner, male    DOB: 13-Jul-1960, 52 y.o.   MRN: [id number]  HPI patient here to followup on his diabetes. He also has 2 lesions on his scalp he would like checked to consider having them surgically removed. He states he is fatigued all the time. His wife has stated he snores a lot at night but does not stop breathing.    Review of Systems     Objective:   Physical Exam HEENT is unremarkable except for 2 cystlike lesions on his scalp chest is clear heart regular rate no murmurs    Results for orders placed in visit on 06/03/12  GLUCOSE, POCT (MANUAL RESULT ENTRY)      Result Value Range   POC Glucose 109 (*) 70 - 99 mg/dl  POCT GLYCOSYLATED HEMOGLOBIN (HGB A1C)      Result Value Range   Hemoglobin A1C 6.0        Assessment & Plan:  He has 2 nodular areas on his scalp will refer to dermatology for this. He does have signs and symptoms consistent with sleep apnea and we'll order a sleep study. His diabetes is under good control. He needs to work on weight loss by diet

## 2012-07-26 ENCOUNTER — Telehealth: Payer: Self-pay

## 2012-07-26 NOTE — Telephone Encounter (Signed)
PT IS REQUESTING A REFILL REQUEST ON LOSARTAN REQUESTING RX TO BE CALLED IN TO [facility] [address]

## 2012-07-28 MED ORDER — LOSARTAN POTASSIUM 50 MG PO TABS: 50.0000 mg | ORAL_TABLET | Freq: Every morning | ORAL | Status: DC

## 2012-07-28 NOTE — Telephone Encounter (Signed)
Meds ordered this encounter  Medications  . losartan (COZAAR) 50 MG tablet    Sig: Take 1 tablet (50 mg total) by mouth every morning.    Dispense:  90 tablet    Refill:  0    Order Specific Question:  Supervising Provider    Answer:  DOOLITTLE, ROBERT P [[id number]]

## 2012-08-21 ENCOUNTER — Encounter: Payer: Self-pay | Admitting: Gastroenterology

## 2012-08-26 ENCOUNTER — Telehealth: Payer: Self-pay

## 2012-08-26 MED ORDER — LOSARTAN POTASSIUM 50 MG PO TABS: 50.0000 mg | ORAL_TABLET | Freq: Every morning | ORAL | Status: DC

## 2012-08-26 NOTE — Telephone Encounter (Signed)
Pt came in to 104 to ask about his rx for losartan. States he asked for a refill in July and only received one months worth and he will be out in 2-3 days. Pts telephone message regarding that request and his medication request in epic show a qty of 90 was sent. Pt states only received qty 30 with no refills.   Please call pt  [phone]  Bf  [facility] [email or web address]

## 2012-08-26 NOTE — Telephone Encounter (Signed)
Sent #60 in for him, so he can get full amount.

## 2012-09-19 ENCOUNTER — Other Ambulatory Visit: Payer: Self-pay | Admitting: Emergency Medicine

## 2012-10-07 ENCOUNTER — Ambulatory Visit (INDEPENDENT_AMBULATORY_CARE_PROVIDER_SITE_OTHER): Payer: 59 | Admitting: Emergency Medicine

## 2012-10-07 VITALS — BP 111/73 | HR 64 | Temp 98.0°F | Resp 16 | Ht 64.5 in | Wt 270.0 lb

## 2012-10-07 DIAGNOSIS — L723 Sebaceous cyst: Secondary | ICD-10-CM

## 2012-10-07 DIAGNOSIS — M25562 Pain in left knee: Secondary | ICD-10-CM

## 2012-10-07 DIAGNOSIS — L729 Follicular cyst of the skin and subcutaneous tissue, unspecified: Secondary | ICD-10-CM

## 2012-10-07 DIAGNOSIS — E119 Type 2 diabetes mellitus without complications: Secondary | ICD-10-CM

## 2012-10-07 DIAGNOSIS — M25569 Pain in unspecified knee: Secondary | ICD-10-CM

## 2012-10-07 LAB — GLUCOSE, POCT (MANUAL RESULT ENTRY): POC Glucose: 97 mg/dl (ref 70–99)

## 2012-10-07 LAB — POCT GLYCOSYLATED HEMOGLOBIN (HGB A1C): Hemoglobin A1C: 6

## 2012-10-07 MED ORDER — ATORVASTATIN CALCIUM 20 MG PO TABS: ORAL_TABLET | ORAL | Status: DC

## 2012-10-07 MED ORDER — METFORMIN HCL 500 MG PO TABS: ORAL_TABLET | ORAL | Status: DC

## 2012-10-07 MED ORDER — LOSARTAN POTASSIUM 50 MG PO TABS: 50.0000 mg | ORAL_TABLET | Freq: Every morning | ORAL | Status: DC

## 2012-10-07 NOTE — Progress Notes (Signed)
  Subjective:    Patient ID: Matthew Garner, male    DOB: March 22, 1960, 52 y.o.   MRN: [id number]  HPI patient has a cyst in the right parietal area. He was referred to dermatology but did not want to pay the $85 co-pay. He also has persistent pain in the outer portion of the left leg. He is unable to kneel for prayer due to the severe pain. He states he's been taking his medication regular tried to watch his diet.    Review of Systems     Objective:   Physical Exam chest was clear. Heart regular rate no murmurs. There is tenderness just below the fibula of the left leg. No masses felt. Pulses the lower extremities are 2+ and symmetrical. Sensation is normal. Results for orders placed in visit on 10/07/12  GLUCOSE, POCT (MANUAL RESULT ENTRY)      Result Value Range   POC Glucose 97  70 - 99 mg/dl  POCT GLYCOSYLATED HEMOGLOBIN (HGB A1C)      Result Value Range   Hemoglobin A1C 6.0           Assessment & Plan:    I referred the patient to the sports med clinic because of the pain in his left leg. Referral was made to Dr. Luciana Axe for good diabetic eye exam. Referral made to Gen. surgery for cyst on his right parietal area. His diabetes is under excellent control with a hemoglobin A1c of 6.

## 2012-10-17 ENCOUNTER — Other Ambulatory Visit: Payer: Self-pay | Admitting: Gastroenterology

## 2012-10-20 ENCOUNTER — Ambulatory Visit: Payer: 59 | Admitting: Sports Medicine

## 2012-10-21 ENCOUNTER — Ambulatory Visit (INDEPENDENT_AMBULATORY_CARE_PROVIDER_SITE_OTHER): Payer: 59 | Admitting: Family Medicine

## 2012-10-21 ENCOUNTER — Ambulatory Visit (INDEPENDENT_AMBULATORY_CARE_PROVIDER_SITE_OTHER): Payer: 59 | Admitting: Surgery

## 2012-10-21 ENCOUNTER — Encounter: Payer: Self-pay | Admitting: Family Medicine

## 2012-10-21 VITALS — BP 130/81 | Ht 65.0 in | Wt 270.0 lb

## 2012-10-21 DIAGNOSIS — M25569 Pain in unspecified knee: Secondary | ICD-10-CM

## 2012-10-21 DIAGNOSIS — M25562 Pain in left knee: Secondary | ICD-10-CM

## 2012-10-21 DIAGNOSIS — S86912A Strain of unspecified muscle(s) and tendon(s) at lower leg level, left leg, initial encounter: Secondary | ICD-10-CM

## 2012-10-21 DIAGNOSIS — IMO0002 Reserved for concepts with insufficient information to code with codable children: Secondary | ICD-10-CM

## 2012-10-21 MED ORDER — NAPROXEN 500 MG PO TABS: 500.0000 mg | ORAL_TABLET | Freq: Two times a day (BID) | ORAL | Status: DC | PRN

## 2012-10-21 NOTE — Patient Instructions (Signed)
Thank you for coming in today  I think that you have strained one of your leg muscles called the peroneal muscle Wear your insoles in all shoes Try ACE wrap or compression sleeve of you leg Try naproxen 2x per day for 1 week then as needed  Followup in 4 weeks.

## 2012-10-21 NOTE — Progress Notes (Signed)
CC: Left lateral lower leg pain HPI: Patient is a 52 year old male with past no history of diabetes who presents for evaluation of left lower leg pain on the lateral aspect of his leg for the last 3 months. He states that this started without injury or trigger. He describes the pain as intermittent and sharp. 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 He denies any weakness, numbness, or paresthesias. He has been unable to identify anything that seems to make his pain better. He notes that he is very tender over the outside of the leg. He did have x-rays of his tibia and fibula by his primary care doctor and was told that these are normal.  ROS: As above in the HPI. All other systems are stable or negative.  PMH: Diabetes, dyslipidemia, hypertension   Social: Patient works in a [employer]. He does not smoke or drink alcohol. Family: Family history is positive for diabetes and high blood pressure, negative for heart disease  Allergies: No known drug allergies    OBJECTIVE: APPEARANCE:  Patient in no acute distress.The patient appeared well nourished and normally developed. HEENT: No scleral icterus. Conjunctiva non-injected Resp: Non labored Skin: No rash MSK:  Lower leg: - No swelling or deformity - Severe tenderness to palpation over the left lateral lower leg in the area of the peroneal muscles - Pain with active foot eversion - Full range of motion of the ankle and foot. - 5 out of 5 strength with pain on foot eversion and dorsi flexion - Neutral stance - Significant supination on gait analysis - Shoe wear pattern shows significant wear on the lateral aspect of the shoes.   ASSESSMENT: #1. Left peroneal muscle strain   PLAN: We will make modifications the patient's shoes to try to help correct his supination. He was given support insoles with a lateral wedge. I also did recommend a calf compression sleeve however this is  cost prohibitive for him. He will therefore instead try an Ace wrap on his lower leg. I've encouraged him to ice his leg as needed. He was also given a course of anti-inflammatory medication with naproxen 500 mg twice a day x1 week and then as needed. We will see him back in 4 weeks for reevaluation. May consider adding therapeutic exercises at that time.

## 2012-11-10 ENCOUNTER — Ambulatory Visit (INDEPENDENT_AMBULATORY_CARE_PROVIDER_SITE_OTHER): Payer: 59 | Admitting: Surgery

## 2012-11-10 ENCOUNTER — Encounter (INDEPENDENT_AMBULATORY_CARE_PROVIDER_SITE_OTHER): Payer: Self-pay | Admitting: Surgery

## 2012-11-10 ENCOUNTER — Encounter (INDEPENDENT_AMBULATORY_CARE_PROVIDER_SITE_OTHER): Payer: Self-pay

## 2012-11-10 VITALS — BP 128/79 | HR 76 | Temp 98.1°F | Resp 16 | Ht 65.0 in | Wt 275.8 lb

## 2012-11-10 DIAGNOSIS — L723 Sebaceous cyst: Secondary | ICD-10-CM

## 2012-11-10 DIAGNOSIS — R220 Localized swelling, mass and lump, head: Secondary | ICD-10-CM

## 2012-11-10 DIAGNOSIS — R229 Localized swelling, mass and lump, unspecified: Secondary | ICD-10-CM

## 2012-11-10 HISTORY — DX: Sebaceous cyst: L72.3

## 2012-11-10 NOTE — Progress Notes (Signed)
Patient ID: Matthew Garner, male   DOB: [birth year], 52 y.o.   MRN: [id number]  Chief Complaint  Patient presents with  . Other    scalp cyst    HPI Matthew Garner is a 52 y.o. male.   HPI This is a pleasant and is referred to Dr. Cleta Alberts for evaluation of 2 painful cysts on the scalp. He has had them for some time but they're now being larger and causing much more discomfort. He believes there crating his headaches. They have never drained. He is also worried about malignancy. Otherwise he is without complaints Past Medical History  Diagnosis Date  . Diabetes mellitus   . GERD (gastroesophageal reflux disease)   . Hyperlipidemia   . Anemia     Past Surgical History  Procedure Laterality Date  . Spine surgery    . Appendectomy      History reviewed. No pertinent family history.  Social History History  Substance Use Topics  . Smoking status: Never Smoker   . Smokeless tobacco: Never Used  . Alcohol Use: No    No Known Allergies  Current Outpatient Prescriptions  Medication Sig Dispense Refill  . atorvastatin (LIPITOR) 20 MG tablet TAKE 1 TABLET BY MOUTH ONCE DAILY  30 tablet  11  . losartan (COZAAR) 50 MG tablet Take 1 tablet (50 mg total) by mouth every morning.  30 tablet  11  . metFORMIN (GLUCOPHAGE) 500 MG tablet TAKE 1 TABLET BY MOUTH TWICE DAILY AS DIRECTED  60 tablet  11  . naproxen (NAPROSYN) 500 MG tablet Take 1 tablet (500 mg total) by mouth 2 (two) times daily as needed.  60 tablet  2  . ranitidine (ZANTAC) 300 MG capsule TAKE 1 CAPSULE BY MOUTH EVERY EVENING  30 capsule  5   No current facility-administered medications for this visit.    Review of Systems Review of Systems  Constitutional: Negative for fever, chills and unexpected weight change.  HENT: Negative for congestion, hearing loss, sore throat, trouble swallowing and voice change.   Eyes: Negative for visual disturbance.  Respiratory: Negative for cough and wheezing.   Cardiovascular: Negative for chest  pain, palpitations and leg swelling.  Gastrointestinal: Negative for nausea, vomiting, abdominal pain, diarrhea, constipation, blood in stool, abdominal distention, anal bleeding and rectal pain.  Genitourinary: Negative for hematuria and difficulty urinating.  Musculoskeletal: Negative for arthralgias.  Skin: Negative for rash and wound.  Neurological: Negative for seizures, syncope, weakness and headaches.  Hematological: Negative for adenopathy. Does not bruise/bleed easily.  Psychiatric/Behavioral: Negative for confusion.    Blood pressure 128/79, pulse 76, temperature 98.1 F (36.7 C), temperature source Temporal, resp. rate 16, height 5\' 5"  (1.651 m), weight 275 lb 12.8 oz (125.102 kg).  Physical Exam Physical Exam  Constitutional: He is oriented to person, place, and time. He appears well-developed and well-nourished. No distress.  HENT:  Head: Normocephalic and atraumatic.  Right Ear: External ear normal.  Left Ear: External ear normal.  Nose: Nose normal.  Mouth/Throat: Oropharynx is clear and moist. No oropharyngeal exudate.  There are 2 firm palpable masses on his scalp. The one on the right side is approximately 3-4 cm. The one on the left side is 1 cm. There are no skin changes and no erythema  Eyes: Conjunctivae are normal. Pupils are equal, round, and reactive to light. Right eye exhibits no discharge. Left eye exhibits no discharge. No scleral icterus.  Neck: Normal range of motion. Neck supple. No tracheal deviation present.  Cardiovascular: Normal  rate, regular rhythm, normal heart sounds and intact distal pulses.   Pulmonary/Chest: Effort normal and breath sounds normal. No respiratory distress. He has no wheezes.  Abdominal: Soft. Bowel sounds are normal. He exhibits no distension. There is no tenderness. There is no rebound.  Musculoskeletal: Normal range of motion. He exhibits no edema and no tenderness.  Lymphadenopathy:    He has no cervical adenopathy.   Neurological: He is alert and oriented to person, place, and time.  Skin: Skin is warm and dry. No rash noted. He is not diaphoretic. No erythema.  Psychiatric: His behavior is normal. Judgment normal.    Data Reviewed   Assessment    Scalp mass x2 of uncertain etiology     Plan    I suspect this may represent sebaceous cysts. He does have considerable discomfort and tenderness from these. Given that, the possibility of malignancy, and his diabetes, removal of the masses is recommended. I discussed the risks of surgery with him which includes but is not limited to bleeding, infection, and recurrence. He understands and wishes to proceed. Surgery will be scheduled        Nalia Honeycutt A 11/10/2012, 3:50 PM

## 2012-11-18 ENCOUNTER — Encounter: Payer: Self-pay | Admitting: Family Medicine

## 2012-11-18 ENCOUNTER — Ambulatory Visit (INDEPENDENT_AMBULATORY_CARE_PROVIDER_SITE_OTHER): Payer: 59 | Admitting: Family Medicine

## 2012-11-18 ENCOUNTER — Ambulatory Visit: Admit: 2012-11-18 | Payer: Self-pay | Admitting: Surgery

## 2012-11-18 VITALS — BP 128/82 | Ht 65.0 in | Wt 270.0 lb

## 2012-11-18 DIAGNOSIS — Z5189 Encounter for other specified aftercare: Secondary | ICD-10-CM

## 2012-11-18 DIAGNOSIS — M25569 Pain in unspecified knee: Secondary | ICD-10-CM

## 2012-11-18 DIAGNOSIS — M25562 Pain in left knee: Secondary | ICD-10-CM

## 2012-11-18 DIAGNOSIS — S86312D Strain of muscle(s) and tendon(s) of peroneal muscle group at lower leg level, left leg, subsequent encounter: Secondary | ICD-10-CM

## 2012-11-18 SURGERY — EXCISION MASS
Anesthesia: Choice

## 2012-11-18 MED ORDER — TRAMADOL HCL 50 MG PO TABS: 50.0000 mg | ORAL_TABLET | Freq: Every evening | ORAL | Status: DC | PRN

## 2012-11-18 NOTE — Progress Notes (Signed)
CC: Followup left lower leg pain and peroneal muscle strain HPI: Patient returns for followup today. When I last saw him I diagnosed him with left peroneal muscle strain. He was noted to have significant supination on gait analysis and was started on naproxen 500 mg twice a day as well as a sport insoles with lateral wedge was added to his shoe. I did recommend compression however he was unable to afford a compression sleeve. He returns today for followup. He states that his pain during the day when he is working on his feet all day has significantly improved. He has no discomfort during the day. However, he notes that when he is trying to lay down to go to bed at night he has a lot of discomfort in that area. This has not improved.  ROS: As above in the HPI. All other systems are stable or negative.  OBJECTIVE: APPEARANCE:  Patient in no acute distress.The patient appeared well nourished and normally developed. HEENT: No scleral icterus. Conjunctiva non-injected Resp: Non labored Skin: No rash MSK: - No calf swelling or deformity - Focal tenderness to palpation over the peroneal muscle on the left - No ecchymosis or defect on palpation - Patient is able to walk on his heels and toes with significant discomfort.   ASSESSMENT: #1. Left peroneal muscle strain, somewhat improved with decreased pain during the day but continued pain at night   PLAN:  I am glad to see that the patient is doing so much better during his work day. I think we are on the right track with biomechanical correction. He will continue to use a sport insoles. We will fit him for custom orthotic in the future. I have written a prescription for him for knee high compression stockings with 15-20 mm of mercury pressure to wear during the day. He was instructed to not wear these at night. I've also asked him to try using either heat or ice on his legs at night to see if this improves his symptoms. We will need to start some  rehabilitation exercises once his symptoms improve. I gave him some tramadol to try night to see if this would help him sleep.

## 2012-11-18 NOTE — Patient Instructions (Signed)
Thank you for coming in today  Wear compression stocking during work Try tramadol as needed at night Try heat and ice at night Try stretching 2x per day  Followup 1 month

## 2012-11-19 ENCOUNTER — Encounter (HOSPITAL_COMMUNITY): Payer: Self-pay | Admitting: *Deleted

## 2012-11-19 NOTE — Progress Notes (Signed)
Pt has to work0cannot come for labs and ekg-will need istat ekg Coming 2 hr preop- Probably has sleep apnea-no test done-

## 2012-11-19 NOTE — Progress Notes (Signed)
11/19/12 1658  OBSTRUCTIVE SLEEP APNEA  Have you ever been diagnosed with sleep apnea through a sleep study? No  Do you snore loudly (loud enough to be heard through closed doors)?  1  Do you often feel tired, fatigued, or sleepy during the daytime? 0  Has anyone observed you stop breathing during your sleep? 1  Do you have, or are you being treated for high blood pressure? 1  BMI more than 35 kg/m2? 1  Age over 50 years old? 0  Neck circumference greater than 40 cm/18 inches? 1  Gender: 1  Obstructive Sleep Apnea Score 6  Score 4 or greater  Results sent to PCP

## 2012-11-23 NOTE — H&P (Signed)
Patient ID: Matthew Garner, male DOB: 09/30/1960, 52 y.o. MRN: [id number]  Chief Complaint   Patient presents with   .  Other     scalp cyst   HPI  Matthew Garner is a 52 y.o. male.  HPI  This is a pleasant and is referred to Dr. Cleta Alberts for evaluation of 2 painful cysts on the scalp. He has had them for some time but they're now being larger and causing much more discomfort. He believes there crating his headaches. They have never drained. He is also worried about malignancy. Otherwise he is without complaints  Past Medical History   Diagnosis  Date   .  Diabetes mellitus    .  GERD (gastroesophageal reflux disease)    .  Hyperlipidemia    .  Anemia     Past Surgical History   Procedure  Laterality  Date   .  Spine surgery     .  Appendectomy     History reviewed. No pertinent family history.  Social History  History   Substance Use Topics   .  Smoking status:  Never Smoker   .  Smokeless tobacco:  Never Used   .  Alcohol Use:  No   No Known Allergies  Current Outpatient Prescriptions   Medication  Sig  Dispense  Refill   .  atorvastatin (LIPITOR) 20 MG tablet  TAKE 1 TABLET BY MOUTH ONCE DAILY  30 tablet  11   .  losartan (COZAAR) 50 MG tablet  Take 1 tablet (50 mg total) by mouth every morning.  30 tablet  11   .  metFORMIN (GLUCOPHAGE) 500 MG tablet  TAKE 1 TABLET BY MOUTH TWICE DAILY AS DIRECTED  60 tablet  11   .  naproxen (NAPROSYN) 500 MG tablet  Take 1 tablet (500 mg total) by mouth 2 (two) times daily as needed.  60 tablet  2   .  ranitidine (ZANTAC) 300 MG capsule  TAKE 1 CAPSULE BY MOUTH EVERY EVENING  30 capsule  5    No current facility-administered medications for this visit.   Review of Systems  Review of Systems  Constitutional: Negative for fever, chills and unexpected weight change.  HENT: Negative for congestion, hearing loss, sore throat, trouble swallowing and voice change.  Eyes: Negative for visual disturbance.  Respiratory: Negative for cough and wheezing.   Cardiovascular: Negative for chest pain, palpitations and leg swelling.  Gastrointestinal: Negative for nausea, vomiting, abdominal pain, diarrhea, constipation, blood in stool, abdominal distention, anal bleeding and rectal pain.  Genitourinary: Negative for hematuria and difficulty urinating.  Musculoskeletal: Negative for arthralgias.  Skin: Negative for rash and wound.  Neurological: Negative for seizures, syncope, weakness and headaches.  Hematological: Negative for adenopathy. Does not bruise/bleed easily.  Psychiatric/Behavioral: Negative for confusion.  Blood pressure 128/79, pulse 76, temperature 98.1 F (36.7 C), temperature source Temporal, resp. rate 16, height 5\' 5"  (1.651 m), weight 275 lb 12.8 oz (125.102 kg).  Physical Exam  Physical Exam  Constitutional: He is oriented to person, place, and time. He appears well-developed and well-nourished. No distress.  HENT:  Head: Normocephalic and atraumatic.  Right Ear: External ear normal.  Left Ear: External ear normal.  Nose: Nose normal.  Mouth/Throat: Oropharynx is clear and moist. No oropharyngeal exudate.  There are 2 firm palpable masses on his scalp. The one on the right side is approximately 3-4 cm. The one on the left side is 1 cm. There are no skin changes and  no erythema  Eyes: Conjunctivae are normal. Pupils are equal, round, and reactive to light. Right eye exhibits no discharge. Left eye exhibits no discharge. No scleral icterus.  Neck: Normal range of motion. Neck supple. No tracheal deviation present.  Cardiovascular: Normal rate, regular rhythm, normal heart sounds and intact distal pulses.  Pulmonary/Chest: Effort normal and breath sounds normal. No respiratory distress. He has no wheezes.  Abdominal: Soft. Bowel sounds are normal. He exhibits no distension. There is no tenderness. There is no rebound.  Musculoskeletal: Normal range of motion. He exhibits no edema and no tenderness.  Lymphadenopathy:  He has no  cervical adenopathy.  Neurological: He is alert and oriented to person, place, and time.  Skin: Skin is warm and dry. No rash noted. He is not diaphoretic. No erythema.  Psychiatric: His behavior is normal. Judgment normal.  Data Reviewed  Assessment  Scalp mass x2 of uncertain etiology  Plan  I suspect this may represent sebaceous cysts. He does have considerable discomfort and tenderness from these. Given that, the possibility of malignancy, and his diabetes, removal of the masses is recommended. I discussed the risks of surgery with him which includes but is not limited to bleeding, infection, and recurrence. He understands and wishes to proceed. Surgery will be scheduled

## 2012-11-24 ENCOUNTER — Encounter (HOSPITAL_BASED_OUTPATIENT_CLINIC_OR_DEPARTMENT_OTHER): Payer: Self-pay | Admitting: Certified Registered Nurse Anesthetist

## 2012-11-24 ENCOUNTER — Ambulatory Visit (HOSPITAL_BASED_OUTPATIENT_CLINIC_OR_DEPARTMENT_OTHER): Payer: 59 | Admitting: Certified Registered Nurse Anesthetist

## 2012-11-24 ENCOUNTER — Encounter (HOSPITAL_BASED_OUTPATIENT_CLINIC_OR_DEPARTMENT_OTHER)
Admission: RE | Disposition: A | Discharge status: Home / Self Care | Payer: Self-pay | Source: Ambulatory Visit | Attending: Surgery

## 2012-11-24 ENCOUNTER — Ambulatory Visit (HOSPITAL_BASED_OUTPATIENT_CLINIC_OR_DEPARTMENT_OTHER)
Admission: RE | Admit: 2012-11-24 | Discharge: 2012-11-24 | Disposition: A | Discharge status: Home / Self Care | Payer: 59 | Source: Ambulatory Visit | Attending: Surgery | Admitting: Surgery

## 2012-11-24 ENCOUNTER — Encounter (HOSPITAL_BASED_OUTPATIENT_CLINIC_OR_DEPARTMENT_OTHER): Payer: 59 | Admitting: Certified Registered Nurse Anesthetist

## 2012-11-24 DIAGNOSIS — I10 Essential (primary) hypertension: Secondary | ICD-10-CM | POA: Insufficient documentation

## 2012-11-24 DIAGNOSIS — Z79899 Other long term (current) drug therapy: Secondary | ICD-10-CM | POA: Insufficient documentation

## 2012-11-24 DIAGNOSIS — E119 Type 2 diabetes mellitus without complications: Secondary | ICD-10-CM | POA: Insufficient documentation

## 2012-11-24 DIAGNOSIS — D234 Other benign neoplasm of skin of scalp and neck: Secondary | ICD-10-CM | POA: Insufficient documentation

## 2012-11-24 DIAGNOSIS — K219 Gastro-esophageal reflux disease without esophagitis: Secondary | ICD-10-CM | POA: Insufficient documentation

## 2012-11-24 HISTORY — DX: Snoring: R06.83

## 2012-11-24 HISTORY — DX: Essential (primary) hypertension: I10

## 2012-11-24 HISTORY — PX: MASS EXCISION: SHX2000

## 2012-11-24 LAB — POCT I-STAT, CHEM 8
BUN: 15 mg/dL (ref 6–23)
Creatinine, Ser: 0.9 mg/dL (ref 0.50–1.35)
Glucose, Bld: 116 mg/dL — ABNORMAL HIGH (ref 70–99)
Hemoglobin: 17.3 g/dL — ABNORMAL HIGH (ref 13.0–17.0)
Potassium: 3.8 mEq/L (ref 3.5–5.1)

## 2012-11-24 SURGERY — EXCISION MASS
Anesthesia: General | Site: Scalp | Laterality: Bilateral | Wound class: Clean

## 2012-11-24 MED ORDER — SODIUM CHLORIDE 0.9 % IJ SOLN: 3.0000 mL | Freq: Two times a day (BID) | INTRAMUSCULAR | Status: DC

## 2012-11-24 MED ORDER — BUPIVACAINE-EPINEPHRINE 0.25% -1:200000 IJ SOLN
INTRAMUSCULAR | Status: DC | PRN
  Administered 2012-11-24: 2 mL

## 2012-11-24 MED ORDER — MIDAZOLAM HCL 2 MG/2ML IJ SOLN: 1.0000 mg | INTRAMUSCULAR | Status: DC | PRN

## 2012-11-24 MED ORDER — PROPOFOL 10 MG/ML IV BOLUS
INTRAVENOUS | Status: DC | PRN
  Administered 2012-11-24: 150 mg via INTRAVENOUS

## 2012-11-24 MED ORDER — CEFAZOLIN SODIUM 1-5 GM-% IV SOLN
INTRAVENOUS | Status: AC
  Filled 2012-11-24: qty 100

## 2012-11-24 MED ORDER — LACTATED RINGERS IV SOLN
INTRAVENOUS | Status: DC
  Administered 2012-11-24: 09:00:00 via INTRAVENOUS

## 2012-11-24 MED ORDER — FENTANYL CITRATE 0.05 MG/ML IJ SOLN
INTRAMUSCULAR | Status: DC | PRN
  Administered 2012-11-24 (×2): 50 ug via INTRAVENOUS

## 2012-11-24 MED ORDER — ACETAMINOPHEN 325 MG PO TABS: 650.0000 mg | ORAL_TABLET | ORAL | Status: DC | PRN

## 2012-11-24 MED ORDER — FENTANYL CITRATE 0.05 MG/ML IJ SOLN: 50.0000 ug | INTRAMUSCULAR | Status: DC | PRN

## 2012-11-24 MED ORDER — MORPHINE SULFATE 2 MG/ML IJ SOLN: 2.0000 mg | INTRAMUSCULAR | Status: DC | PRN

## 2012-11-24 MED ORDER — OXYCODONE HCL 5 MG PO TABS: 5.0000 mg | ORAL_TABLET | ORAL | Status: DC | PRN

## 2012-11-24 MED ORDER — HYDROMORPHONE HCL PF 1 MG/ML IJ SOLN: 0.2500 mg | INTRAMUSCULAR | Status: DC | PRN

## 2012-11-24 MED ORDER — CEFAZOLIN SODIUM-DEXTROSE 2-3 GM-% IV SOLR
2.0000 g | INTRAVENOUS | Status: AC
  Administered 2012-11-24: 2 g via INTRAVENOUS

## 2012-11-24 MED ORDER — SODIUM CHLORIDE 0.9 % IJ SOLN: 3.0000 mL | INTRAMUSCULAR | Status: DC | PRN

## 2012-11-24 MED ORDER — SODIUM CHLORIDE 0.9 % IV SOLN: 250.0000 mL | INTRAVENOUS | Status: DC | PRN

## 2012-11-24 MED ORDER — MIDAZOLAM HCL 2 MG/2ML IJ SOLN
INTRAMUSCULAR | Status: AC
  Filled 2012-11-24: qty 2

## 2012-11-24 MED ORDER — PROPOFOL 10 MG/ML IV EMUL
INTRAVENOUS | Status: AC
  Filled 2012-11-24: qty 50

## 2012-11-24 MED ORDER — HYDROCODONE-ACETAMINOPHEN 5-325 MG PO TABS: 1.0000 | ORAL_TABLET | ORAL | Status: DC | PRN

## 2012-11-24 MED ORDER — ONDANSETRON HCL 4 MG/2ML IJ SOLN: 4.0000 mg | Freq: Four times a day (QID) | INTRAMUSCULAR | Status: DC | PRN

## 2012-11-24 MED ORDER — ACETAMINOPHEN 650 MG RE SUPP: 650.0000 mg | RECTAL | Status: DC | PRN

## 2012-11-24 MED ORDER — DEXAMETHASONE SODIUM PHOSPHATE 4 MG/ML IJ SOLN
INTRAMUSCULAR | Status: DC | PRN
  Administered 2012-11-24: 4 mg via INTRAVENOUS

## 2012-11-24 MED ORDER — LIDOCAINE HCL (CARDIAC) 20 MG/ML IV SOLN
INTRAVENOUS | Status: DC | PRN
  Administered 2012-11-24: 60 mg via INTRAVENOUS

## 2012-11-24 MED ORDER — FENTANYL CITRATE 0.05 MG/ML IJ SOLN
INTRAMUSCULAR | Status: AC
  Filled 2012-11-24: qty 6

## 2012-11-24 MED ORDER — MIDAZOLAM HCL 5 MG/5ML IJ SOLN
INTRAMUSCULAR | Status: DC | PRN
  Administered 2012-11-24: 2 mg via INTRAVENOUS

## 2012-11-24 SURGICAL SUPPLY — 42 items
BENZOIN TINCTURE PRP APPL 2/3 (GAUZE/BANDAGES/DRESSINGS) IMPLANT
BLADE SURG 10 STRL SS (BLADE) IMPLANT
BLADE SURG 15 STRL LF DISP TIS (BLADE) ×1 IMPLANT
BLADE SURG 15 STRL SS (BLADE) ×1
BLADE SURG ROTATE 9660 (MISCELLANEOUS) ×2 IMPLANT
CANISTER SUCTION 2500CC (MISCELLANEOUS) IMPLANT
COVER SURGICAL LIGHT HANDLE (MISCELLANEOUS) IMPLANT
DECANTER SPIKE VIAL GLASS SM (MISCELLANEOUS) IMPLANT
DRAPE LAPAROSCOPIC ABDOMINAL (DRAPES) IMPLANT
DRAPE PED LAPAROTOMY (DRAPES) IMPLANT
DRAPE U-SHAPE 76X120 STRL (DRAPES) ×2 IMPLANT
ELECT CAUTERY BLADE 6.4 (BLADE) IMPLANT
ELECT COATED BLADE 2.86 ST (ELECTRODE) ×2 IMPLANT
ELECT REM PT RETURN 9FT ADLT (ELECTROSURGICAL) ×2
ELECTRODE REM PT RTRN 9FT ADLT (ELECTROSURGICAL) ×1 IMPLANT
GLOVE ECLIPSE 6.5 STRL STRAW (GLOVE) ×2 IMPLANT
GLOVE SURG SIGNA 7.5 PF LTX (GLOVE) ×2 IMPLANT
GOWN PREVENTION PLUS XXLARGE (GOWN DISPOSABLE) ×2 IMPLANT
GOWN STRL NON-REIN LRG LVL3 (GOWN DISPOSABLE) ×2 IMPLANT
GOWN STRL REIN XL XLG (GOWN DISPOSABLE) IMPLANT
KIT BASIN OR (CUSTOM PROCEDURE TRAY) IMPLANT
KIT ROOM TURNOVER OR (KITS) IMPLANT
NEEDLE HYPO 25X1 1.5 SAFETY (NEEDLE) ×2 IMPLANT
NS IRRIG 1000ML POUR BTL (IV SOLUTION) IMPLANT
PACK BASIN DAY SURGERY FS (CUSTOM PROCEDURE TRAY) ×2 IMPLANT
PACK SURGICAL SETUP 50X90 (CUSTOM PROCEDURE TRAY) IMPLANT
PAD ARMBOARD 7.5X6 YLW CONV (MISCELLANEOUS) IMPLANT
PENCIL BUTTON HOLSTER BLD 10FT (ELECTRODE) ×2 IMPLANT
SPECIMEN JAR SMALL (MISCELLANEOUS) IMPLANT
SPONGE GAUZE 4X4 12PLY (GAUZE/BANDAGES/DRESSINGS) IMPLANT
SPONGE LAP 18X18 X RAY DECT (DISPOSABLE) IMPLANT
STRIP CLOSURE SKIN 1/2X4 (GAUZE/BANDAGES/DRESSINGS) IMPLANT
SUT MNCRL AB 4-0 PS2 18 (SUTURE) IMPLANT
SUT PROLENE 3 0 PS 2 (SUTURE) ×4 IMPLANT
SUT VIC AB 3-0 SH 27 (SUTURE)
SUT VIC AB 3-0 SH 27XBRD (SUTURE) IMPLANT
SYR BULB 3OZ (MISCELLANEOUS) IMPLANT
SYR CONTROL 10ML LL (SYRINGE) ×2 IMPLANT
TOWEL OR 17X24 6PK STRL BLUE (TOWEL DISPOSABLE) ×4 IMPLANT
TOWEL OR 17X26 10 PK STRL BLUE (TOWEL DISPOSABLE) IMPLANT
TUBE CONNECTING 12X1/4 (SUCTIONS) IMPLANT
YANKAUER SUCT BULB TIP NO VENT (SUCTIONS) IMPLANT

## 2012-11-24 NOTE — Anesthesia Postprocedure Evaluation (Signed)
  Anesthesia Post-op Note  Patient: Matthew Garner  Procedure(s) Performed: Procedure(s): EXCISION SCALP MASS X2 (Bilateral)  Patient Location: PACU  Anesthesia Type:General  Level of Consciousness: awake and alert   Airway and Oxygen Therapy: Patient Spontanous Breathing  Post-op Pain: none  Post-op Assessment: Post-op Vital signs reviewed, Patient's Cardiovascular Status Stable and Respiratory Function Stable  Post-op Vital Signs: Reviewed  Filed Vitals:   11/24/12 1153  BP: 112/65  Pulse: 58  Temp: 36.4 C  Resp: 16    Complications: No apparent anesthesia complications

## 2012-11-24 NOTE — Anesthesia Procedure Notes (Signed)
Procedure Name: LMA Insertion Date/Time: 11/24/2012 9:54 AM Performed by: Abigail Miyamoto A Pre-anesthesia Checklist: Patient identified, Emergency Drugs available, Suction available and Patient being monitored Patient Re-evaluated:Patient Re-evaluated prior to inductionOxygen Delivery Method: Circle System Utilized Preoxygenation: Pre-oxygenation with 100% oxygen Intubation Type: IV induction Ventilation: Mask ventilation without difficulty LMA: LMA inserted LMA Size: 5.0 Number of attempts: 1 Airway Equipment and Method: bite block Placement Confirmation: positive ETCO2 Tube secured with: Tape Dental Injury: Teeth and Oropharynx as per pre-operative assessment

## 2012-11-24 NOTE — Op Note (Signed)
EXCISION SCALP MASS X2  Procedure Note  Matthew Garner 11/24/2012   Pre-op Diagnosis: SCALP MASS X 2     Post-op Diagnosis: same  Procedure(s): EXCISION SCALP MASS X2 3cm right scalp, 1 cm left scalp  Surgeon(s): Shelly Rubenstein, MD  Anesthesia: General  Staff:  Circulator: Aleen Sells, RN Scrub Person: Idell Pickles, CST  Estimated Blood Loss: Minimal               Specimens:  Sent to path     Findings: The patient had a 3 cm cyst on the right scalp and a 1 cm cyst on the left scalp. Both appeared consistent with sebaceous cyst. I sent the larger one to pathology for evaluation  Procedure in detail: The patient was brought to the operating room and identified as the correct patient. He was placed supine on the operating room table and general anesthesia was induced. His left scalp was then prepped and draped in the usual sterile fashion. I anesthetized the skin with Marcaine with epinephrine. I made a small incision with a scalpel. I then removed the cyst in its entirety with a hemostat. It appeared consistent with a sebaceous cyst and was approximately 1 cm in size. I then closed the wound with interrupted 3-0 Prolene sutures. We then turned his head toward the left area his right scalp was then prepped and draped. I anesthetized the skin over the larger cyst with Marcaine with epinephrine. I made an incision with a scalpel and removed the 3 cm cyst which also appeared consistent with a sebaceous cyst from the scalp. This one  was sent to pathology.   I then closed the wound with interrupted 3-0 Prolene sutures. The patient tolerated the procedure well. All the counts were correct at the end of the procedure. The patient was extubated in the operating room and taken in a stable condition to the recovery room.   [name]   Date: 11/24/2012  Time: 10:20 AM

## 2012-11-24 NOTE — Transfer of Care (Signed)
Immediate Anesthesia Transfer of Care Note  Patient: Matthew Garner  Procedure(s) Performed: Procedure(s): EXCISION SCALP MASS X2 (Bilateral)  Patient Location: PACU  Anesthesia Type:General  Level of Consciousness: awake and patient cooperative  Airway & Oxygen Therapy: Patient Spontanous Breathing and Patient connected to face mask oxygen  Post-op Assessment: Report given to PACU RN and Post -op Vital signs reviewed and stable  Post vital signs: Reviewed and stable  Complications: No apparent anesthesia complications

## 2012-11-24 NOTE — Interval H&P Note (Signed)
History and Physical Interval Note: no change in H and P  11/24/2012 9:28 AM  Matthew Garner  has presented today for surgery, with the diagnosis of SCALP MASS X 2  The various methods of treatment have been discussed with the patient and family. After consideration of risks, benefits and other options for treatment, the patient has consented to  Procedure(s): EXCISION SCALP MASS X'S 2 (N/A) as a surgical intervention .  The patient's history has been reviewed, patient examined, no change in status, stable for surgery.  I have reviewed the patient's chart and labs.  Questions were answered to the patient's satisfaction.     Matthew Garner A

## 2012-11-24 NOTE — Anesthesia Preprocedure Evaluation (Addendum)
Anesthesia Evaluation  Patient identified by MRN, date of birth, ID band Patient awake    Reviewed: Allergy & Precautions, H&P , NPO status , Patient's Chart, lab work & pertinent test results  Airway Mallampati: II TM Distance: >3 FB Neck ROM: Full    Dental no notable dental hx. (+) Teeth Intact and Dental Advisory Given   Pulmonary neg pulmonary ROS,  breath sounds clear to auscultation  Pulmonary exam normal       Cardiovascular hypertension, Pt. on medications Rhythm:Regular Rate:Normal     Neuro/Psych negative neurological ROS  negative psych ROS   GI/Hepatic Neg liver ROS, GERD-  Medicated and Controlled,  Endo/Other  diabetes, Type 2, Oral Hypoglycemic AgentsMorbid obesity  Renal/GU negative Renal ROS  negative genitourinary   Musculoskeletal   Abdominal   Peds  Hematology negative hematology ROS (+) anemia ,   Anesthesia Other Findings   Reproductive/Obstetrics negative OB ROS                         Anesthesia Physical Anesthesia Plan  ASA: III  Anesthesia Plan: General   Post-op Pain Management:    Induction: Intravenous  Airway Management Planned: LMA and Oral ETT  Additional Equipment:   Intra-op Plan:   Post-operative Plan: Extubation in OR  Informed Consent: I have reviewed the patients History and Physical, chart, labs and discussed the procedure including the risks, benefits and alternatives for the proposed anesthesia with the patient or authorized representative who has indicated his/her understanding and acceptance.   Dental advisory given  Plan Discussed with: CRNA and Surgeon  Anesthesia Plan Comments:         Anesthesia Quick Evaluation

## 2012-11-25 ENCOUNTER — Encounter (HOSPITAL_BASED_OUTPATIENT_CLINIC_OR_DEPARTMENT_OTHER): Payer: Self-pay | Admitting: Surgery

## 2012-12-04 ENCOUNTER — Ambulatory Visit (INDEPENDENT_AMBULATORY_CARE_PROVIDER_SITE_OTHER): Payer: 59 | Admitting: Surgery

## 2012-12-04 ENCOUNTER — Encounter (INDEPENDENT_AMBULATORY_CARE_PROVIDER_SITE_OTHER): Payer: Self-pay | Admitting: Surgery

## 2012-12-04 DIAGNOSIS — Z09 Encounter for follow-up examination after completed treatment for conditions other than malignant neoplasm: Secondary | ICD-10-CM

## 2012-12-04 NOTE — Progress Notes (Signed)
Subjective:     Patient ID: Matthew Garner, male   DOB: 1960/03/07, 52 y.o.   MRN: [id number]  HPI He is here for postop visit. He has no complaints.   Review of Systems     Objective:   Physical Exam On exam the sutures are intact on the scalp. They were removed. The incisions are healing well. The final pathology showed benign cysts    Assessment:     Stable postop     Plan:     He may resume normal activity. I will see him back as needed

## 2012-12-08 ENCOUNTER — Emergency Department (HOSPITAL_COMMUNITY): Payer: 59

## 2012-12-08 ENCOUNTER — Ambulatory Visit (INDEPENDENT_AMBULATORY_CARE_PROVIDER_SITE_OTHER): Payer: 59 | Admitting: Emergency Medicine

## 2012-12-08 ENCOUNTER — Encounter (HOSPITAL_COMMUNITY): Payer: Self-pay | Admitting: Emergency Medicine

## 2012-12-08 ENCOUNTER — Emergency Department (HOSPITAL_COMMUNITY)
Admission: EM | Admit: 2012-12-08 | Discharge: 2012-12-08 | Disposition: A | Discharge status: Home / Self Care | Payer: 59 | Attending: Emergency Medicine | Admitting: Emergency Medicine

## 2012-12-08 VITALS — BP 148/80 | HR 58 | Temp 98.0°F | Resp 16

## 2012-12-08 DIAGNOSIS — Z862 Personal history of diseases of the blood and blood-forming organs and certain disorders involving the immune mechanism: Secondary | ICD-10-CM | POA: Insufficient documentation

## 2012-12-08 DIAGNOSIS — E785 Hyperlipidemia, unspecified: Secondary | ICD-10-CM | POA: Insufficient documentation

## 2012-12-08 DIAGNOSIS — R109 Unspecified abdominal pain: Secondary | ICD-10-CM

## 2012-12-08 DIAGNOSIS — R110 Nausea: Secondary | ICD-10-CM

## 2012-12-08 DIAGNOSIS — K219 Gastro-esophageal reflux disease without esophagitis: Secondary | ICD-10-CM | POA: Insufficient documentation

## 2012-12-08 DIAGNOSIS — Z79899 Other long term (current) drug therapy: Secondary | ICD-10-CM | POA: Insufficient documentation

## 2012-12-08 DIAGNOSIS — N209 Urinary calculus, unspecified: Secondary | ICD-10-CM | POA: Insufficient documentation

## 2012-12-08 DIAGNOSIS — E669 Obesity, unspecified: Secondary | ICD-10-CM | POA: Insufficient documentation

## 2012-12-08 DIAGNOSIS — E119 Type 2 diabetes mellitus without complications: Secondary | ICD-10-CM | POA: Insufficient documentation

## 2012-12-08 DIAGNOSIS — I10 Essential (primary) hypertension: Secondary | ICD-10-CM | POA: Insufficient documentation

## 2012-12-08 LAB — BASIC METABOLIC PANEL
BUN: 14 mg/dL (ref 6–23)
CO2: 27 mEq/L (ref 19–32)
Calcium: 9.1 mg/dL (ref 8.4–10.5)
Chloride: 99 mEq/L (ref 96–112)
GFR calc Af Amer: >90 mL/min (ref >90)
Potassium: 4.4 mEq/L (ref 3.5–5.1)

## 2012-12-08 LAB — POCT URINALYSIS DIPSTICK
Leukocytes, UA: NEGATIVE
Nitrite, UA: NEGATIVE
Urobilinogen, UA: 0.2

## 2012-12-08 LAB — CBC WITH DIFFERENTIAL/PLATELET
Basophils Relative: 0 % (ref 0–1)
HCT: 41.4 % (ref 39.0–52.0)
Hemoglobin: 14 g/dL (ref 13.0–17.0)
Lymphocytes Relative: 10 % — ABNORMAL LOW (ref 12–46)
Lymphs Abs: 0.9 10*3/uL (ref 0.7–4.0)
MCHC: 33.8 g/dL (ref 30.0–36.0)
Monocytes Absolute: 0.5 10*3/uL (ref 0.1–1.0)
Monocytes Relative: 5 % (ref 3–12)
Neutro Abs: 8 10*3/uL — ABNORMAL HIGH (ref 1.7–7.7)
Neutrophils Relative %: 84 % — ABNORMAL HIGH (ref 43–77)
Platelets: 248 10*3/uL (ref 150–400)
RBC: 4.87 MIL/uL (ref 4.22–5.81)
WBC: 9.6 10*3/uL (ref 4.0–10.5)

## 2012-12-08 LAB — POCT UA - MICROSCOPIC ONLY
Casts, Ur, LPF, POC: NEGATIVE
Crystals, Ur, HPF, POC: NEGATIVE
Epithelial cells, urine per micros: NEGATIVE
Yeast, UA: NEGATIVE

## 2012-12-08 LAB — POCT CBC
Granulocyte percent: 69.4 %G (ref 37–80)
HCT, POC: 47.6 % (ref 43.5–53.7)
Hemoglobin: 15.1 g/dL (ref 14.1–18.1)
Lymph, poc: 1.7 (ref 0.6–3.4)
MPV: 8.9 fL (ref 0–99.8)
POC Granulocyte: 4.8 (ref 2–6.9)
POC MID %: 6.1 %M (ref 0–12)
RBC: 5.15 M/uL (ref 4.69–6.13)

## 2012-12-08 LAB — GLUCOSE, POCT (MANUAL RESULT ENTRY): POC Glucose: 152 mg/dl — AB (ref 70–99)

## 2012-12-08 MED ORDER — ONDANSETRON 4 MG PO TBDP: 4.0000 mg | ORAL_TABLET | Freq: Three times a day (TID) | ORAL | Status: DC | PRN

## 2012-12-08 MED ORDER — KETOROLAC TROMETHAMINE 60 MG/2ML IM SOLN
60.0000 mg | Freq: Once | INTRAMUSCULAR | Status: AC
  Administered 2012-12-08: 60 mg via INTRAMUSCULAR

## 2012-12-08 MED ORDER — TAMSULOSIN HCL 0.4 MG PO CAPS: 0.4000 mg | ORAL_CAPSULE | Freq: Every day | ORAL | Status: DC

## 2012-12-08 MED ORDER — OXYCODONE-ACETAMINOPHEN 5-325 MG PO TABS: 1.0000 | ORAL_TABLET | ORAL | Status: DC | PRN

## 2012-12-08 MED ORDER — ONDANSETRON HCL 4 MG/2ML IJ SOLN
4.0000 mg | Freq: Once | INTRAMUSCULAR | Status: AC
  Administered 2012-12-08: 4 mg via INTRAVENOUS
  Filled 2012-12-08: qty 2

## 2012-12-08 MED ORDER — ONDANSETRON 4 MG PO TBDP
8.0000 mg | ORAL_TABLET | Freq: Once | ORAL | Status: AC
  Administered 2012-12-08: 8 mg via ORAL

## 2012-12-08 MED ORDER — HYDROMORPHONE HCL PF 1 MG/ML IJ SOLN
1.0000 mg | Freq: Once | INTRAMUSCULAR | Status: AC
  Administered 2012-12-08: 1 mg via INTRAVENOUS
  Filled 2012-12-08: qty 1

## 2012-12-08 NOTE — Progress Notes (Addendum)
Subjective:    Patient ID: Matthew Garner, male    DOB: 1960/01/13, 52 y.o.   MRN: [id number]  This chart was scribed for Matthew Garner Matthew Garner-MD, by Ladona Ridgel Day, Scribe. This patient was seen in room 6 and the patient's care was started at 11:12 AM.  HPI Matthew Garner is a 52 y.o. male who presents to the [facility] w/hx of kidney stones complaining of constant, right sided back pain and abdominal pain, sudden onset while at work this AM, associated w/nausea.   Past Medical History  Diagnosis Date  . Diabetes mellitus   . GERD (gastroesophageal reflux disease)   . Hyperlipidemia   . Anemia   . Hypertension   . Snores    Past Surgical History  Procedure Laterality Date  . Spine surgery  2004    lumb  . Appendectomy    . Mass excision Bilateral 11/24/2012    Procedure: EXCISION SCALP MASS X2;  Surgeon: Shelly Rubenstein, MD;  Location: [facility];  Service: General;  Laterality: Bilateral;   History reviewed. No pertinent family history. History   Social History  . Marital Status: Married    Spouse Name: N/A    Number of Children: N/A  . Years of Education: N/A   Occupational History  . Not on file.   Social History Main Topics  . Smoking status: Never Smoker   . Smokeless tobacco: Never Used  . Alcohol Use: No  . Drug Use: No  . Sexual Activity: Not on file   Other Topics Concern  . Not on file   Social History Narrative  . No narrative on file   No Known Allergies Patient Active Problem List   Diagnosis Date Noted  . Sebaceous cyst 11/10/2012  . Diabetes 11/27/2011  . Hyperlipidemia 11/27/2011  . Hypertension 11/27/2011  . Environmental allergies 11/27/2011   Meds ordered this encounter  Medications  . ketorolac (TORADOL) injection 60 mg    Sig:    Review of Systems  Constitutional: Negative for fever and chills.  HENT: Negative for congestion.   Respiratory: Negative for cough.   Cardiovascular: Negative for chest pain.    Gastrointestinal: Positive for nausea and abdominal pain. Negative for vomiting and diarrhea.  Musculoskeletal: Positive for back pain.  Skin: Negative for color change.   Triage Vitals: BP 148/80  Pulse 58  Temp(Src) 98 F (36.7 C) (Oral)  Resp 16  SpO2 100%     Objective:   Physical Exam Physical Exam  Nursing note and vitals reviewed. Constitutional: Patient is oriented to person, place, and time. Patient appears well-developed and well-nourished. No distress.  HENT:  Head: Normocephalic and atraumatic.  Neck: Neck supple. No tracheal deviation present.  Cardiovascular: Normal rate, regular rhythm and normal heart sounds.   No murmur heard. Pulmonary/Chest: Effort normal and breath sounds normal. No respiratory distress. Patient has no wheezes. Patient has no rales.  Musculoskeletal: Normal range of motion.  Neurological: Patient is alert and oriented to person, place, and time.  Skin: Skin is warm and dry.  Psychiatric: Patient has a normal mood and affect. Patient's behavior is normal.  Patient leaning over and feeling faint diaphoretic and trying to obtain a urine specimen. He is tender in the right flank and right side of the abdomen. He is pacing in the room due to his 10 out of 10 pain.    Results for orders placed in visit on 12/08/12  POCT URINALYSIS DIPSTICK  Result Value Range   Color, UA yellow     Clarity, UA clear     Glucose, UA neg     Bilirubin, UA neg     Ketones, UA trace     Spec Grav, UA >=1.030     Blood, UA trace-intact     pH, UA 5.0     Protein, UA trace     Urobilinogen, UA 0.2     Nitrite, UA neg     Leukocytes, UA Negative    POCT UA - MICROSCOPIC ONLY      Result Value Range   WBC, Ur, HPF, POC 0-1     RBC, urine, microscopic 2-3     Bacteria, U Microscopic trace     Mucus, UA positive     Epithelial cells, urine per micros neg     Crystals, Ur, HPF, POC neg     Casts, Ur, LPF, POC neg     Yeast, UA neg     Assessment & Plan:   . Patient presenting with severe right flank and abdominal pain associated with nausea and vomiting. History and physical exam are consistent with a right renal calculus. Will give 60 of Toradol and Zofran and see if we can get him comfortable. If not he will need to go to the emergency room for further evaluation. Patient has received some relief with the Zofran and Toradol. IVs will be started he'll be transported to the hospital for further evaluation. He has history of kidney stone he passed 2 years ago but none since that time. He is a diabetic .

## 2012-12-08 NOTE — ED Notes (Signed)
Bed: WA22 Expected date:  Expected time:  Means of arrival:  Comments: EMS Seizure 

## 2012-12-08 NOTE — ED Provider Notes (Signed)
Medical screening examination/treatment/procedure(s) were conducted as a shared visit with non-physician practitioner(s) and myself.  I personally evaluated the patient during the encounter.  EKG Interpretation   None       Pain controlled. 2mm stone. Dc home in good condition. Outpatient urology follow up  Lyanne Co, MD 12/08/12 [phone]

## 2012-12-08 NOTE — ED Provider Notes (Signed)
CSN: [id number]     Arrival date & time 12/08/12  1203 History   First MD Initiated Contact with Patient 12/08/12 1242     Chief Complaint  Patient presents with  . Flank Pain   (Consider location/radiation/quality/duration/timing/severity/associated sxs/prior Treatment) The history is provided by the patient and medical records.   This is a 52 year old male with past medical history significant for her diabetes, hypertension, hyperlipidemia, GERD, presenting to the ED for sudden onset of right flank pain with radiation to right groin this morning. Patient endorses some associated nausea and dysuria, but no vomiting.  Patient has a history of prior kidney stones, all with spontaneous passage.  Patient seen at [facility] earlier this morning and sent to ED for further evaluation. He was given Toradol and Zofran in office with some relief of symptoms.  No recent fevers sweats, or chills.  No testicle pain or urethral discharge.  Past Medical History  Diagnosis Date  . Diabetes mellitus   . GERD (gastroesophageal reflux disease)   . Hyperlipidemia   . Anemia   . Hypertension   . Snores    Past Surgical History  Procedure Laterality Date  . Spine surgery  2004    lumb  . Appendectomy    . Mass excision Bilateral 11/24/2012    Procedure: EXCISION SCALP MASS X2;  Surgeon: Shelly Rubenstein, MD;  Location: [facility];  Service: General;  Laterality: Bilateral;   History reviewed. No pertinent family history. History  Substance Use Topics  . Smoking status: Never Smoker   . Smokeless tobacco: Never Used  . Alcohol Use: No    Review of Systems  Gastrointestinal: Positive for nausea.  Genitourinary: Positive for dysuria and flank pain.  All other systems reviewed and are negative.    Allergies  Review of patient's allergies indicates no known allergies.  Home Medications   Current Outpatient Rx  Name  Route  Sig  Dispense  Refill  . atorvastatin (LIPITOR) 20 MG  tablet      TAKE 1 TABLET BY MOUTH ONCE DAILY   30 tablet   11   . HYDROcodone-acetaminophen (NORCO) 5-325 MG per tablet   Oral   Take 1-2 tablets by mouth every 4 (four) hours as needed for moderate pain.   30 tablet   0   . losartan (COZAAR) 50 MG tablet   Oral   Take 1 tablet (50 mg total) by mouth every morning.   30 tablet   11   . metFORMIN (GLUCOPHAGE) 500 MG tablet      500 mg. TAKE 1 TABLET BY MOUTH TWICE DAILY AS DIRECTED         . naproxen (NAPROSYN) 500 MG tablet   Oral   Take 1 tablet (500 mg total) by mouth 2 (two) times daily as needed.   60 tablet   2   . ranitidine (ZANTAC) 300 MG capsule      TAKE 1 CAPSULE BY MOUTH EVERY EVENING   30 capsule   5   . traMADol (ULTRAM) 50 MG tablet   Oral   Take 1 tablet (50 mg total) by mouth at bedtime as needed.   40 tablet   0    BP 115/68  Pulse 63  Temp(Src) 98.1 F (36.7 C) (Oral)  Resp 20  SpO2 97%  Physical Exam  Nursing note and vitals reviewed. Constitutional: He is oriented to person, place, and time. He appears well-developed and well-nourished. No distress.  Obese; appears uncomfortable  but not distressed  HENT:  Head: Normocephalic and atraumatic.  Mouth/Throat: Oropharynx is clear and moist.  Eyes: Conjunctivae and EOM are normal. Pupils are equal, round, and reactive to light.  Neck: Normal range of motion.  Cardiovascular: Normal rate, regular rhythm and normal heart sounds.   Pulmonary/Chest: Effort normal and breath sounds normal. No respiratory distress. He has no wheezes.  Abdominal: Soft. Bowel sounds are normal. There is no tenderness. There is CVA tenderness (right). There is no rigidity and no guarding.  Musculoskeletal: Normal range of motion.  Neurological: He is alert and oriented to person, place, and time.  Skin: Skin is warm and dry. He is not diaphoretic.  Psychiatric: He has a normal mood and affect.    ED Course  Procedures (including critical care time) Labs  Review Labs Reviewed  CBC WITH DIFFERENTIAL - Abnormal; Notable for the following:    Neutrophils Relative % 84 (*)    Neutro Abs 8.0 (*)    Lymphocytes Relative 10 (*)    All other components within normal limits  BASIC METABOLIC PANEL - Abnormal; Notable for the following:    Glucose, Bld 132 (*)    All other components within normal limits  URINALYSIS, ROUTINE W REFLEX MICROSCOPIC   Imaging Review Ct Abdomen Pelvis Wo Contrast  12/08/2012   CLINICAL DATA:  Right flank pain. Painful urination. History of kidney stones.  EXAM: CT ABDOMEN AND PELVIS WITHOUT CONTRAST  TECHNIQUE: Multidetector CT imaging of the abdomen and pelvis was performed following the standard protocol without intravenous contrast.  COMPARISON:  03/04/2012.  FINDINGS: Lung Bases: Clear.  Liver: Diffuse low attenuation with areas of fatty sparing. No mass lesion.  Spleen:  Normal.  Gallbladder:  Partially contracted.  No calcified stones.  Common bile duct:  Normal.  Pancreas:  Fatty atrophy, likely associated with diabetes.  Adrenal glands:  Normal bilaterally.  Kidneys: No renal calculi or hydronephrosis. Mild stranding along the proximal right ureter. There is a 2 mm calculus in the intramural distal right ureter at the UVJ. Left ureter appears normal.  Stomach:  Collapsed.  No inflammatory changes.  Small bowel: Normal. Mesenteric calcifications and mesenteric nodule appear unchanged compared to prior exam. Given the stability, this is of doubtful significance.  Colon:   Colonic diverticulosis.  No inflammatory changes.  Pelvic Genitourinary: No inflammatory changes of the urinary bladder.  Bones: No aggressive osseous lesions. Lumbar spondylosis. Stable benign bone island in the right ischium.  Vasculature: Normal.  Body Wall: Injection granuloma in the gluteal subcutaneous fat bilaterally.  IMPRESSION: 1. 2 mm right UVJ stone. Minimal right ureteral ectasia. No hydronephrosis. No residual collecting system calculi. 2. Fatty  liver. 3. Unchanged mesenteric calcifications and nodule, likely post infectious/inflammatory.   Electronically Signed   By: Andreas Newport M.D.   On: 12/08/2012 13:46    EKG Interpretation   None       MDM   1. Urinary calculi    Labs as above.  CT revealing 2mm right UVJ stone without associated hydronephrosis.  U/a ordered, however pt urinated without collecting specimen.  U/a from [place] was reviewed-- trace blood without signs of infection.  Pt given pain meds and anti-emetics with good improvement of sx.  Pt afebrile, non-toxic appearing, NAD, VS stable- ok for discharge.  Rx percocet, zofran, flomax.  Instructed to strain all urine and monitor for passage of stone.  FU with urology if problems occur.  Discussed plan with pt, he agreed.  Return precautions advised.  Garlon Hatchet, PA-C 12/08/12 [phone]

## 2012-12-08 NOTE — ED Notes (Signed)
Pt went to [facility] where he was seen for flank pain. Pt has hx of kidney stones and is presenting with same sx. IV started at urgent care. No distress noted.

## 2012-12-08 NOTE — Progress Notes (Signed)
   Subjective:    Patient ID: Matthew Garner, male    DOB: 06/13/1960, 52 y.o.   MRN: [id number]  HPI    Review of Systems     Objective:   Physical Exam      Assessment & Plan:   

## 2012-12-12 ENCOUNTER — Encounter (HOSPITAL_COMMUNITY): Payer: Self-pay | Admitting: Emergency Medicine

## 2012-12-12 ENCOUNTER — Emergency Department (HOSPITAL_COMMUNITY): Payer: 59

## 2012-12-12 ENCOUNTER — Emergency Department (HOSPITAL_COMMUNITY)
Admission: EM | Admit: 2012-12-12 | Discharge: 2012-12-13 | Disposition: A | Discharge status: Home / Self Care | Payer: 59 | Attending: Emergency Medicine | Admitting: Emergency Medicine

## 2012-12-12 DIAGNOSIS — Z862 Personal history of diseases of the blood and blood-forming organs and certain disorders involving the immune mechanism: Secondary | ICD-10-CM | POA: Insufficient documentation

## 2012-12-12 DIAGNOSIS — Z9089 Acquired absence of other organs: Secondary | ICD-10-CM | POA: Insufficient documentation

## 2012-12-12 DIAGNOSIS — K219 Gastro-esophageal reflux disease without esophagitis: Secondary | ICD-10-CM | POA: Insufficient documentation

## 2012-12-12 DIAGNOSIS — E119 Type 2 diabetes mellitus without complications: Secondary | ICD-10-CM | POA: Insufficient documentation

## 2012-12-12 DIAGNOSIS — Z79899 Other long term (current) drug therapy: Secondary | ICD-10-CM | POA: Insufficient documentation

## 2012-12-12 DIAGNOSIS — E785 Hyperlipidemia, unspecified: Secondary | ICD-10-CM | POA: Insufficient documentation

## 2012-12-12 DIAGNOSIS — N23 Unspecified renal colic: Secondary | ICD-10-CM | POA: Insufficient documentation

## 2012-12-12 DIAGNOSIS — I10 Essential (primary) hypertension: Secondary | ICD-10-CM | POA: Insufficient documentation

## 2012-12-12 DIAGNOSIS — Z87442 Personal history of urinary calculi: Secondary | ICD-10-CM | POA: Insufficient documentation

## 2012-12-12 LAB — POCT I-STAT, CHEM 8
Calcium, Ion: 1.19 mmol/L (ref 1.12–1.23)
Creatinine, Ser: 0.8 mg/dL (ref 0.50–1.35)
Glucose, Bld: 109 mg/dL — ABNORMAL HIGH (ref 70–99)
Hemoglobin: 16.7 g/dL (ref 13.0–17.0)
TCO2: 28 mmol/L (ref 0–100)

## 2012-12-12 LAB — URINALYSIS, ROUTINE W REFLEX MICROSCOPIC
Bilirubin Urine: NEGATIVE
Hgb urine dipstick: NEGATIVE
Ketones, ur: NEGATIVE mg/dL
Nitrite: NEGATIVE
Protein, ur: NEGATIVE mg/dL
Urobilinogen, UA: 0.2 mg/dL (ref 0.0–1.0)
pH: 5.5 (ref 5.0–8.0)

## 2012-12-12 MED ORDER — TAMSULOSIN HCL 0.4 MG PO CAPS: 0.4000 mg | ORAL_CAPSULE | Freq: Every day | ORAL | Status: DC

## 2012-12-12 MED ORDER — OXYCODONE-ACETAMINOPHEN 10-325 MG PO TABS: 1.0000 | ORAL_TABLET | ORAL | Status: DC | PRN

## 2012-12-12 MED ORDER — PROMETHAZINE HCL 25 MG PO TABS: 25.0000 mg | ORAL_TABLET | Freq: Four times a day (QID) | ORAL | Status: DC | PRN

## 2012-12-12 MED ORDER — FENTANYL CITRATE 0.05 MG/ML IJ SOLN
100.0000 ug | Freq: Once | INTRAMUSCULAR | Status: AC
  Administered 2012-12-12: 100 ug via INTRAVENOUS
  Filled 2012-12-12: qty 2

## 2012-12-12 MED ORDER — ONDANSETRON 4 MG PO TBDP
8.0000 mg | ORAL_TABLET | Freq: Once | ORAL | Status: AC
  Administered 2012-12-12: 8 mg via ORAL
  Filled 2012-12-12: qty 2

## 2012-12-12 MED ORDER — KETOROLAC TROMETHAMINE 30 MG/ML IJ SOLN
30.0000 mg | Freq: Once | INTRAMUSCULAR | Status: AC
  Administered 2012-12-12: 30 mg via INTRAVENOUS
  Filled 2012-12-12: qty 1

## 2012-12-12 MED ORDER — SODIUM CHLORIDE 0.9 % IV BOLUS (SEPSIS)
1000.0000 mL | Freq: Once | INTRAVENOUS | Status: AC
  Administered 2012-12-12: 1000 mL via INTRAVENOUS

## 2012-12-12 MED ORDER — HYDROMORPHONE HCL PF 1 MG/ML IJ SOLN
1.0000 mg | Freq: Once | INTRAMUSCULAR | Status: AC
  Administered 2012-12-12: 1 mg via INTRAVENOUS
  Filled 2012-12-12: qty 1

## 2012-12-12 NOTE — ED Notes (Addendum)
Pt alert, NAD, calm, interactive, resps e/u, speaking in clear complete sentences, IVF bolus infusing, "feels better", pain remains, nausea resolved, VSS. Family at [facility].

## 2012-12-12 NOTE — ED Provider Notes (Signed)
CSN: [id number]     Arrival date & time 12/12/12  1410 History   First MD Initiated Contact with Patient 12/12/12 1540     Chief Complaint  Patient presents with  . Flank Pain   (Consider location/radiation/quality/duration/timing/severity/associated sxs/prior Treatment) HPI Patient presents emergency department with continued right flank pain.  The patient, states he was seen last Monday for kidney stone.  He states he was referred to urology, but did not followup.  The patient, states had no vomiting, but some nausea.  Patient denies chest pain, shortness of breath, weakness, numbness, dizziness, headache, blurred vision, fever, or syncope.  The patient, states, that he's had increased urination since the stone, but no decrease in his urine output Past Medical History  Diagnosis Date  . Diabetes mellitus   . GERD (gastroesophageal reflux disease)   . Hyperlipidemia   . Anemia   . Hypertension   . Snores    Past Surgical History  Procedure Laterality Date  . Spine surgery  2004    lumb  . Appendectomy    . Mass excision Bilateral 11/24/2012    Procedure: EXCISION SCALP MASS X2;  Surgeon: Shelly Rubenstein, MD;  Location: [facility];  Service: General;  Laterality: Bilateral;   No family history on file. History  Substance Use Topics  . Smoking status: Never Smoker   . Smokeless tobacco: Never Used  . Alcohol Use: No    Review of Systems All other systems negative except as documented in the HPI. All pertinent positives and negatives as reviewed in the HPI. Allergies  Review of patient's allergies indicates no known allergies.  Home Medications   Current Outpatient Rx  Name  Route  Sig  Dispense  Refill  . atorvastatin (LIPITOR) 20 MG tablet   Oral   Take 20 mg by mouth daily.         Marland Kitchen losartan (COZAAR) 50 MG tablet   Oral   Take 1 tablet (50 mg total) by mouth every morning.   30 tablet   11   . metFORMIN (GLUCOPHAGE) 500 MG tablet   Oral  Take 500 mg by mouth 2 (two) times daily with a meal.         . naproxen (NAPROSYN) 500 MG tablet   Oral   Take 1 tablet (500 mg total) by mouth 2 (two) times daily as needed.   60 tablet   2   . ondansetron (ZOFRAN ODT) 4 MG disintegrating tablet   Oral   Take 1 tablet (4 mg total) by mouth every 8 (eight) hours as needed for nausea.   10 tablet   0   . oxyCODONE-acetaminophen (PERCOCET/ROXICET) 5-325 MG per tablet   Oral   Take 1 tablet by mouth every 4 (four) hours as needed.   15 tablet   0   . ranitidine (ZANTAC) 300 MG tablet   Oral   Take 300 mg by mouth at bedtime.         . tamsulosin (FLOMAX) 0.4 MG CAPS capsule   Oral   Take 1 capsule (0.4 mg total) by mouth daily after supper.   5 capsule   0   . traMADol (ULTRAM) 50 MG tablet   Oral   Take 1 tablet (50 mg total) by mouth at bedtime as needed.   40 tablet   0    BP 106/63  Pulse 64  Temp(Src) 98 F (36.7 C) (Oral)  Resp 17  Wt 270 lb (122.471 kg)  SpO2 93% Physical Exam  Nursing note and vitals reviewed. Constitutional: He is oriented to person, place, and time. He appears well-developed and well-nourished. No distress.  HENT:  Head: Normocephalic and atraumatic.  Eyes: Pupils are equal, round, and reactive to light.  Neck: Normal range of motion. Neck supple.  Cardiovascular: Normal rate, regular rhythm and normal heart sounds.  Exam reveals no gallop and no friction rub.   No murmur heard. Pulmonary/Chest: Effort normal and breath sounds normal. No respiratory distress.  Abdominal: Soft. Bowel sounds are normal. He exhibits no distension. There is tenderness. There is no rebound and no guarding.    Neurological: He is alert and oriented to person, place, and time.  Skin: Skin is warm and dry.    ED Course  Procedures (including critical care time) Labs Review Labs Reviewed  POCT I-STAT, CHEM 8 - Abnormal; Notable for the following:    Glucose, Bld 109 (*)    All other components  within normal limits  URINALYSIS, ROUTINE W REFLEX MICROSCOPIC  URINALYSIS, ROUTINE W REFLEX MICROSCOPIC   Imaging Review Dg Abd 1 View  12/12/2012   CLINICAL DATA:  Right flank discomfort.  EXAM: ABDOMEN - 1 VIEW  COMPARISON:  None.  FINDINGS: On the right no calcified stones are demonstrated over the kidney nor along the expected course of the ureter or urinary bladder. On the left no stone is demonstrated overlying the kidney. There calcification that projects adjacent to the transverse process of L2 on the left which was not demonstrated on the CT scan of December 08, 2012. At that time no stones were demonstrated in the left renal collecting system. This likely lies within bowel. Within the limits of normal. There is degenerative disc space narrowing at L4-5.  IMPRESSION: 1. No calcified stones are demonstrated on the right. 2. On the left projecting just lateral to or overlying the left L2 transverse process there are 2 calcifications with 1 measuring up to 3 mm in diameter. These were not demonstrated on the earlier CT scan of 08 December 2012 and likely do not reflect urinary tract stones. 3. The bowel gas pattern is within the limits of normal. 4. There is degenerative disc space narrowing at L4-5.   Electronically Signed   By: David  [place]   On: 12/12/2012 16:38   US Renal  12/12/2012   CLINICAL DATA:  Flank pain  EXAM: RENAL/URINARY TRACT ULTRASOUND COMPLETE  COMPARISON:  CT abdomen pelvis dated 12/08/2012  FINDINGS: Right Kidney:  Length: 13.0 cm.  No mass or hydronephrosis.  Left Kidney:  Length: 11.3 cm.  No mass or hydronephrosis.  Bladder:  Within normal limits.  IMPRESSION: Negative renal ultrasound.   Electronically Signed   By: Charline Bills M.D.   On: 12/12/2012 18:56    Patient will be referred to urology for followup.  The patient is advised to return here for any worsening in his condition.  The patient does not have any hydronephrosis or abnormal findings on his ultrasound or  x-rays.  Patient has normal kidney function does not have any signs of urinary.  Patient is not showing any signs of sepsis.  Patient, states he was instructed only followup as needed with urology to advise him that he will need close followup  Carlyle Dolly, PA-C 12/12/12 2133

## 2012-12-12 NOTE — ED Notes (Signed)
Pt c/o rt sided abd pain. Pt states he was seen at [facility] this Monday and dx with Kidney stones, was told the stones would pass but they have not and he is having increased pain. Pt rates pain 10/10. Pt denies taking anything for the pain.

## 2012-12-12 NOTE — ED Notes (Signed)
Pt seen at [facility] on Monday and diagnosed with R kidney stone.  Per chart, pt given instructions to f/u with urology if symptoms worsened.  Pt presents here today with c/o worsening R flank pain and now c/o L flank pain.  Pt states Rx given are not helping.

## 2012-12-13 NOTE — ED Provider Notes (Signed)
Medical screening examination/treatment/procedure(s) were performed by non-physician practitioner and as supervising physician I was immediately available for consultation/collaboration.  Flint Melter, MD 12/13/12 [phone]

## 2012-12-13 NOTE — ED Notes (Signed)
Pain mild, nausea gone, reports some dizziness with orthostatic VS ("a little"), steady on feet in room, dressed self, family with pt.

## 2012-12-16 ENCOUNTER — Ambulatory Visit: Payer: 59 | Admitting: Family Medicine

## 2012-12-23 ENCOUNTER — Ambulatory Visit: Payer: 59 | Admitting: Family Medicine

## 2013-02-03 ENCOUNTER — Encounter: Payer: Self-pay | Admitting: Emergency Medicine

## 2013-02-03 ENCOUNTER — Ambulatory Visit (INDEPENDENT_AMBULATORY_CARE_PROVIDER_SITE_OTHER): Payer: 59 | Admitting: Emergency Medicine

## 2013-02-03 VITALS — BP 116/80 | HR 76 | Temp 98.3°F | Resp 16 | Ht 64.0 in | Wt 267.0 lb

## 2013-02-03 DIAGNOSIS — E119 Type 2 diabetes mellitus without complications: Secondary | ICD-10-CM

## 2013-02-03 LAB — POCT GLYCOSYLATED HEMOGLOBIN (HGB A1C): HEMOGLOBIN A1C: 6.2

## 2013-02-03 LAB — GLUCOSE, POCT (MANUAL RESULT ENTRY): POC GLUCOSE: 112 mg/dL — AB (ref 70–99)

## 2013-02-03 MED ORDER — GLUCOSE BLOOD VI STRP: ORAL_STRIP | Status: DC

## 2013-02-03 MED ORDER — LANCETS MISC: Status: DC

## 2013-02-03 MED ORDER — BLOOD GLUCOSE METER KIT: PACK | Status: DC

## 2013-02-03 NOTE — Progress Notes (Signed)
Subjective:    Patient ID: Matthew Garner, male    DOB: 1960/05/25, 53 y.o.   MRN: [id number] This chart was scribed for [name], MD by Rolanda Lundborg, ED Scribe. This patient was seen in room 21 and the patient's care was started at 4:18 PM.  Chief Complaint  Patient presents with  . Hypertension  . Diabetes  . Hyperlipidemia    HPI HPI Comments: Matthew Garner is a 53 y.o. male with a h/o HTN, DM, hyperlipidemia, and cyst excision in the scalp who presents to [facility] for a follow up. Last time pt was here was 2 months ago for extreme abdominal pain. He passed kidney stones and the pain has now resolved. He was referred to orthopedist for eval of left leg pain and went to sports medicine clinic. He reports no improvement. He states the pain worsens when he gets on his knees to pray.   Past Medical History  Diagnosis Date  . Diabetes mellitus   . GERD (gastroesophageal reflux disease)   . Hyperlipidemia   . Anemia   . Hypertension   . Snores    Current Outpatient Prescriptions on File Prior to Visit  Medication Sig Dispense Refill  . atorvastatin (LIPITOR) 20 MG tablet Take 20 mg by mouth daily.      Marland Kitchen losartan (COZAAR) 50 MG tablet Take 1 tablet (50 mg total) by mouth every morning.  30 tablet  11  . metFORMIN (GLUCOPHAGE) 500 MG tablet Take 500 mg by mouth 2 (two) times daily with a meal.      . naproxen (NAPROSYN) 500 MG tablet Take 1 tablet (500 mg total) by mouth 2 (two) times daily as needed.  60 tablet  2  . ondansetron (ZOFRAN ODT) 4 MG disintegrating tablet Take 1 tablet (4 mg total) by mouth every 8 (eight) hours as needed for nausea.  10 tablet  0  . oxyCODONE-acetaminophen (PERCOCET) 10-325 MG per tablet Take 1 tablet by mouth every 4 (four) hours as needed for pain.  20 tablet  0  . oxyCODONE-acetaminophen (PERCOCET/ROXICET) 5-325 MG per tablet Take 1 tablet by mouth every 4 (four) hours as needed.  15 tablet  0  . promethazine (PHENERGAN) 25 MG tablet Take 1 tablet (25  mg total) by mouth every 6 (six) hours as needed for nausea or vomiting.  10 tablet  0  . ranitidine (ZANTAC) 300 MG tablet Take 300 mg by mouth at bedtime.      . tamsulosin (FLOMAX) 0.4 MG CAPS capsule Take 1 capsule (0.4 mg total) by mouth daily after supper.  5 capsule  0  . tamsulosin (FLOMAX) 0.4 MG CAPS capsule Take 1 capsule (0.4 mg total) by mouth daily.  5 capsule  0  . traMADol (ULTRAM) 50 MG tablet Take 1 tablet (50 mg total) by mouth at bedtime as needed.  40 tablet  0   No current facility-administered medications on file prior to visit.   No Known Allergies    Review of Systems  Constitutional: Negative for fatigue and unexpected weight change.  Eyes: Negative for visual disturbance.  Respiratory: Negative for cough, chest tightness and shortness of breath.   Cardiovascular: Negative for chest pain, palpitations and leg swelling.  Gastrointestinal: Negative for abdominal pain and blood in stool.  Neurological: Negative for dizziness, light-headedness and headaches.       Objective:   Physical Exam CONSTITUTIONAL: Well developed/well nourished HEAD: Normocephalic/atraumatic EYES: EOMI/PERRL ENMT: Mucous membranes moist NECK: supple no meningeal signs  SPINE:entire spine nontender CV: S1/S2 noted, no murmurs/rubs/gallops noted LUNGS: Lungs are clear to auscultation bilaterally, no apparent distress ABDOMEN: soft, nontender, no rebound or guarding GU:no cva tenderness NEURO: Pt is awake/alert, moves all extremitiesx4 EXTREMITIES: pulses normal, full ROM SKIN: warm, color normal PSYCH: no abnormalities of mood noted  Filed Vitals:   02/03/13 1616  BP: 116/80  Pulse: 76  Temp: 98.3 F (36.8 C)  TempSrc: Oral  Resp: 16  Height: 5\' 4"  (1.626 m)  Weight: 267 lb (121.11 kg)  SpO2: 96%    Results for orders placed in visit on 02/03/13  GLUCOSE, POCT (MANUAL RESULT ENTRY)      Result Value Range   POC Glucose 112 (*) 70 - 99 mg/dl  POCT GLYCOSYLATED  HEMOGLOBIN (HGB A1C)      Result Value Range   Hemoglobin A1C 6.2          Assessment & Plan:   1. Type II or unspecified type diabetes mellitus without mention of complication, not stated as uncontrolled    No change in medications patient stable his A1c is 6.2 he is willing to start checking his sugars

## 2013-03-03 ENCOUNTER — Other Ambulatory Visit: Payer: Self-pay | Admitting: Radiology

## 2013-03-03 DIAGNOSIS — E119 Type 2 diabetes mellitus without complications: Secondary | ICD-10-CM

## 2013-03-03 MED ORDER — LOSARTAN POTASSIUM 50 MG PO TABS
50.0000 mg | ORAL_TABLET | Freq: Every morning | ORAL | Status: DC
Start: 2013-03-03 — End: 2013-09-29

## 2013-05-17 ENCOUNTER — Other Ambulatory Visit: Payer: Self-pay | Admitting: Emergency Medicine

## 2013-05-30 ENCOUNTER — Encounter: Payer: Self-pay | Admitting: Gastroenterology

## 2013-06-16 ENCOUNTER — Ambulatory Visit (INDEPENDENT_AMBULATORY_CARE_PROVIDER_SITE_OTHER): Payer: 59 | Admitting: Emergency Medicine

## 2013-06-16 ENCOUNTER — Encounter: Payer: Self-pay | Admitting: Emergency Medicine

## 2013-06-16 VITALS — BP 122/76 | HR 64 | Temp 98.2°F | Resp 18 | Wt 259.0 lb

## 2013-06-16 DIAGNOSIS — M25569 Pain in unspecified knee: Secondary | ICD-10-CM

## 2013-06-16 DIAGNOSIS — E119 Type 2 diabetes mellitus without complications: Secondary | ICD-10-CM

## 2013-06-16 DIAGNOSIS — M79609 Pain in unspecified limb: Secondary | ICD-10-CM

## 2013-06-16 DIAGNOSIS — M79662 Pain in left lower leg: Secondary | ICD-10-CM

## 2013-06-16 LAB — COMPLETE METABOLIC PANEL WITH GFR
ALK PHOS: 72 U/L (ref 39–117)
ALT: 17 U/L (ref 0–53)
AST: 16 U/L (ref 0–37)
Albumin: 4.3 g/dL (ref 3.5–5.2)
BILIRUBIN TOTAL: 0.6 mg/dL (ref 0.2–1.2)
BUN: 12 mg/dL (ref 6–23)
CO2: 30 mEq/L (ref 19–32)
CREATININE: 0.69 mg/dL (ref 0.50–1.35)
Calcium: 9.5 mg/dL (ref 8.4–10.5)
Chloride: 101 mEq/L (ref 96–112)
GFR, Est African American: >89 mL/min
Glucose, Bld: 96 mg/dL (ref 70–99)
Potassium: 4.4 mEq/L (ref 3.5–5.3)
Sodium: 138 mEq/L (ref 135–145)
TOTAL PROTEIN: 6.9 g/dL (ref 6.0–8.3)

## 2013-06-16 LAB — GLUCOSE, POCT (MANUAL RESULT ENTRY): POC Glucose: 109 mg/dl — AB (ref 70–99)

## 2013-06-16 LAB — CK: Total CK: 99 U/L (ref 7–232)

## 2013-06-16 LAB — POCT GLYCOSYLATED HEMOGLOBIN (HGB A1C): HEMOGLOBIN A1C: 6.3

## 2013-06-16 MED ORDER — DICLOFENAC SODIUM 1 % TD GEL: 2.0000 g | Freq: Three times a day (TID) | TRANSDERMAL | Status: DC

## 2013-06-16 MED ORDER — RANITIDINE HCL 300 MG PO TABS: 300.0000 mg | ORAL_TABLET | Freq: Every day | ORAL | Status: DC

## 2013-06-16 MED ORDER — ATORVASTATIN CALCIUM 20 MG PO TABS: ORAL_TABLET | ORAL | Status: DC

## 2013-06-16 NOTE — Progress Notes (Signed)
Subjective:    Patient ID: Matthew Garner, male    DOB: 1960-11-28, 53 y.o.   MRN: [id number] This chart was scribed for [name], MD by Anastasia Pall, ED Scribe. This patient was seen in room 22 and the patient's care was started at 4:31 PM.  Chief Complaint  Patient presents with  . Diabetes    not checking sugar  . Colonoscopy    due but wants to wait until aftter fast  . Leg Pain    has seen ortho, still has pain    HPI Matthew Garner is a 53 y.o. male Pt has h/o obesity and DM. Last seen 12/2012 diagnosed with kidney stone.   Pt presents for a DM check up. He denies any more kidney stones, related symptoms.   He reports he has still been having bilateral knee pain. He denies any obvious injuries. He reports bending his knees exacerbates his pain. He states he received compression socks and medicine, that he has tried without relief. He states he had an US done not long ago with negative results. He states he has tried massaging his muscles with oil and heat without relief. He reports trouble sleeping secondary to his pain.   He denies having a cardiologist. He states he has not been checking his sugar levels. He reports he takes his DM medication regularly. He states he lost some weight during his recent trip back to [place]. Pt states he will schedule his colonoscopy after his upcoming 1 month fast.   Patient Active Problem List   Diagnosis Date Noted  . Sebaceous cyst 11/10/2012  . Diabetes 11/27/2011  . Hyperlipidemia 11/27/2011  . Hypertension 11/27/2011  . Environmental allergies 11/27/2011   Past Medical History  Diagnosis Date  . Diabetes mellitus   . GERD (gastroesophageal reflux disease)   . Hyperlipidemia   . Anemia   . Hypertension   . Snores    Past Surgical History  Procedure Laterality Date  . Spine surgery  2004    lumb  . Appendectomy    . Mass excision Bilateral 11/24/2012    Procedure: EXCISION SCALP MASS X2;  Surgeon: [name], MD;   Location: [facility];  Service: General;  Laterality: Bilateral;   No Known Allergies Prior to Admission medications   Medication Sig Start Date End Date Taking? Authorizing Provider  atorvastatin (LIPITOR) 20 MG tablet TAKE 1 TABLET BY MOUTH EVERY DAY    [name], MD  Blood Glucose Monitoring Suppl (BLOOD GLUCOSE METER) kit Use as instructed 02/03/13   [name], MD  glucose blood test strip To check blood sugars once daily dx code 250.00 02/03/13   [name], MD  Lancets MISC To check blood sugars once daily, dx code 250.00 02/03/13   [name], MD  losartan (COZAAR) 50 MG tablet Take 1 tablet (50 mg total) by mouth every morning. 03/03/13   [name], MD  metFORMIN (GLUCOPHAGE) 500 MG tablet Take 500 mg by mouth 2 (two) times daily with a meal.    Historical Provider, MD  naproxen (NAPROSYN) 500 MG tablet Take 1 tablet (500 mg total) by mouth 2 (two) times daily as needed. 10/21/12   Duane Boston, MD  ondansetron (ZOFRAN ODT) 4 MG disintegrating tablet Take 1 tablet (4 mg total) by mouth every 8 (eight) hours as needed for nausea. 12/08/12   Larene Pickett, PA-C  oxyCODONE-acetaminophen (PERCOCET) 10-325 MG per tablet Take 1 tablet  by mouth every 4 (four) hours as needed for pain. 12/12/12   [place], PA-C  oxyCODONE-acetaminophen (PERCOCET/ROXICET) 5-325 MG per tablet Take 1 tablet by mouth every 4 (four) hours as needed. 12/08/12   Larene Pickett, PA-C  promethazine (PHENERGAN) 25 MG tablet Take 1 tablet (25 mg total) by mouth every 6 (six) hours as needed for nausea or vomiting. 12/12/12   Resa Miner Lawyer, PA-C  ranitidine (ZANTAC) 300 MG tablet Take 300 mg by mouth at bedtime.    Historical Provider, MD  tamsulosin (FLOMAX) 0.4 MG CAPS capsule Take 1 capsule (0.4 mg total) by mouth daily after supper. 12/08/12   Larene Pickett, PA-C  tamsulosin (FLOMAX) 0.4 MG CAPS capsule Take 1 capsule (0.4 mg total) by mouth daily. 12/12/12   Resa Miner Lawyer,  PA-C  traMADol (ULTRAM) 50 MG tablet Take 1 tablet (50 mg total) by mouth at bedtime as needed. 11/18/12   Duane Boston, MD   Review of Systems  Constitutional: Negative for fever, chills and diaphoresis.  HENT: Negative for congestion.   Respiratory: Negative for cough.   Cardiovascular: Negative for chest pain.  Gastrointestinal: Negative for abdominal pain.  Genitourinary: Negative for dysuria, frequency, hematuria and difficulty urinating.  Musculoskeletal: Positive for arthralgias (bilateral knee) and myalgias (bilateral LE). Negative for joint swelling.  Skin: Negative for wound.  Psychiatric/Behavioral: Positive for sleep disturbance (secondary to leg pain).      Objective:   Physical Exam Nursing note and vitals reviewed. Constitutional: He is oriented to person, place, and time. No distress. Obese.  HENT:  Head: Normocephalic and atraumatic.  Eyes: Conjunctivae and EOM are normal. Pupils are equal, round, and reactive to light.  Neck: Normal range of motion. Neck supple. No thyromegaly present.  Cardiovascular: Normal rate, regular rhythm, normal heart sounds and intact distal pulses.   No murmur heard. Pulmonary/Chest: Effort normal and breath sounds normal. No respiratory distress. He exhibits no tenderness.  Abdominal: Soft. Bowel sounds are normal. He exhibits no mass. There is no hepatosplenomegaly. There is no tenderness.  Musculoskeletal: Normal range of motion. He exhibits tenderness. He exhibits no edema. Tenderness over lateral left calf. No definite mass was felt. Pulses 2+ and symmetrical. No neuropathy.  Lymphadenopathy:    He has no cervical adenopathy.  Neurological: He is alert and oriented to person, place, and time.  Skin: Skin is warm and dry.  Psychiatric: He has a normal mood and affect. His behavior is normal.  Results for orders placed in visit on 06/16/13  GLUCOSE, POCT (MANUAL RESULT ENTRY)      Result Value Ref Range   POC Glucose 109 (*) 70 - 99  mg/dl  POCT GLYCOSYLATED HEMOGLOBIN (HGB A1C)      Result Value Ref Range   Hemoglobin A1C 6.3     BP 122/76  Pulse 64  Temp(Src) 98.2 F (36.8 C)  Resp 18  Wt 259 lb (117.482 kg)  SpO2 99%     Assessment & Plan:   Diabetes is the goal. He'll have his colonoscopy after Ramadan is finished. I did schedule him for an MRI of his left leg to be done after the fasting time . I personally performed the services described in this documentation, which was scribed in my presence. The recorded information has been reviewed and is accurate.

## 2013-06-17 ENCOUNTER — Telehealth: Payer: Self-pay

## 2013-06-17 LAB — SEDIMENTATION RATE: SED RATE: 4 mm/h (ref 0–16)

## 2013-06-17 NOTE — Telephone Encounter (Signed)
Submitted clinical request for precertification for patients mri tibia fibula left w contrast.   Needs peer to peer per [facility].   Patient ID: [id number]  Time: 06/17/2013 4:54 PM Patient Name: Matthew Garner, Matthew Garner  Based on the clinical information provided, this request has not demonstrated consistency with evidence-based clinical guidelines. Your Notification number request has been assigned Case Number [id number].  A Physician-to-Physician discussion is required to complete the Notification Process. The ordering physician must call [phone] and select option #3 to engage in a Physician-to-Physician discussion. Please ensure the physician has the case number provided above available when making this call. If a Physician-to-Physician discussion is not conducted within 3 business days, this notification number request will be deemed expired and you must reinitiate the Notification Process.  To view the current status of this request, please visit [email or web address] and select Radiology Notification and Status under the Notifications menu or call [employer] at [phone].

## 2013-06-18 NOTE — Telephone Encounter (Signed)
Spoke with Dr. Laurance Flatten for peer-to-peer. Chart reviewed. Notification number: [id number]

## 2013-06-18 NOTE — Telephone Encounter (Signed)
Sent referral to [facility] with authorization number  Angelita Ingles will contact patient

## 2013-06-23 ENCOUNTER — Telehealth: Payer: Self-pay

## 2013-06-23 DIAGNOSIS — M79605 Pain in left leg: Principal | ICD-10-CM

## 2013-06-23 DIAGNOSIS — M79604 Pain in right leg: Secondary | ICD-10-CM

## 2013-06-23 NOTE — Telephone Encounter (Signed)
Patient ID: [id number]  Time: 06/23/2013 4:07 PM Patient Name: Matthew Garner, Matthew Garner  Based on the clinical information provided, this request has not demonstrated consistency with evidence-based clinical guidelines. Your Notification number request has been assigned Case Number [id number].  A Physician-to-Physician discussion is required to complete the Notification Process. The ordering physician must call [phone] and select option #3 to engage in a Physician-to-Physician discussion. Please ensure the physician has the case number provided above available when making this call. If a Physician-to-Physician discussion is not conducted within 3 business days, this notification number request will be deemed expired and you must reinitiate the Notification Process.  To view the current status of this request, please visit [email or web address] and select Radiology Notification and Status under the Notifications menu or call [employer] at [phone].    Pt scheduled for 07/27/13 at 600

## 2013-06-24 NOTE — Telephone Encounter (Signed)
LMOM for pt to CB. Ask pt if he is willing for [name] to refer to ortho, if so we can order it per Dr Perfecto [facility].

## 2013-06-24 NOTE — Telephone Encounter (Signed)
Call patient at length and no insurance carrier will not approve his MRI. My suggestion would be referral to an orthopedist if he is willing make the appointment with [facility] or [facility]

## 2013-06-27 NOTE — Telephone Encounter (Signed)
Pt notified and is ok with referral. Referral placed.

## 2013-07-27 ENCOUNTER — Other Ambulatory Visit: Payer: 59

## 2013-09-29 ENCOUNTER — Encounter: Payer: Self-pay | Admitting: Emergency Medicine

## 2013-09-29 ENCOUNTER — Ambulatory Visit (INDEPENDENT_AMBULATORY_CARE_PROVIDER_SITE_OTHER): Payer: 59 | Admitting: Emergency Medicine

## 2013-09-29 VITALS — BP 108/69 | HR 55 | Temp 98.0°F | Resp 16 | Ht 64.0 in | Wt 261.0 lb

## 2013-09-29 DIAGNOSIS — E785 Hyperlipidemia, unspecified: Secondary | ICD-10-CM

## 2013-09-29 DIAGNOSIS — E119 Type 2 diabetes mellitus without complications: Secondary | ICD-10-CM

## 2013-09-29 DIAGNOSIS — I10 Essential (primary) hypertension: Secondary | ICD-10-CM

## 2013-09-29 LAB — COMPLETE METABOLIC PANEL WITH GFR
ALBUMIN: 4.4 g/dL (ref 3.5–5.2)
ALT: 20 U/L (ref 0–53)
AST: 15 U/L (ref 0–37)
Alkaline Phosphatase: 75 U/L (ref 39–117)
BILIRUBIN TOTAL: 0.5 mg/dL (ref 0.2–1.2)
BUN: 15 mg/dL (ref 6–23)
CO2: 27 mEq/L (ref 19–32)
Calcium: 9.6 mg/dL (ref 8.4–10.5)
Chloride: 98 mEq/L (ref 96–112)
Creat: 0.76 mg/dL (ref 0.50–1.35)
GFR, Est African American: >89 mL/min
GFR, Est Non African American: >89 mL/min
GLUCOSE: 99 mg/dL (ref 70–99)
POTASSIUM: 4.8 meq/L (ref 3.5–5.3)
SODIUM: 138 meq/L (ref 135–145)
TOTAL PROTEIN: 6.9 g/dL (ref 6.0–8.3)

## 2013-09-29 LAB — LIPID PANEL
Cholesterol: 135 mg/dL (ref 0–200)
HDL: 64 mg/dL (ref >39)
LDL CALC: 62 mg/dL (ref 0–99)
TRIGLYCERIDES: 43 mg/dL (ref <150)
Total CHOL/HDL Ratio: 2.1 Ratio
VLDL: 9 mg/dL (ref 0–40)

## 2013-09-29 LAB — GLUCOSE, POCT (MANUAL RESULT ENTRY): POC Glucose: 122 mg/dl — AB (ref 70–99)

## 2013-09-29 LAB — POCT GLYCOSYLATED HEMOGLOBIN (HGB A1C): Hemoglobin A1C: 6.1

## 2013-09-29 MED ORDER — LOSARTAN POTASSIUM 50 MG PO TABS: 50.0000 mg | ORAL_TABLET | Freq: Every morning | ORAL | Status: DC

## 2013-09-29 NOTE — Progress Notes (Signed)
   Subjective:    Patient ID: Matthew Garner, male    DOB: 04-22-1960, 53 y.o.   MRN: [id number]  HPI    Review of Systems     Objective:   Physical Exam      Assessment & Plan:

## 2013-09-29 NOTE — Progress Notes (Signed)
   Subjective:    Patient ID: Matthew Garner, male    DOB: May 13, 1960, 53 y.o.   MRN: [id number]  HPI patient here to followup on his diabetes and hyperlipidemia and hypertension. He is currently under the care by Dr. Mliss Fritz for his back disease. He received an injection lites Thursday and does not feel it helped . He feels like his diabetes has been under good control. He has been taking his medication regularly. He has not had any recurrence of his kidney stones    Review of Systems     Objective:   Physical Exam  Constitutional: He appears well-developed.  Eyes: Pupils are equal, round, and reactive to light.  Neck: No tracheal deviation present. No thyromegaly present.  Cardiovascular: Normal rate and regular rhythm.   Pulmonary/Chest: He has no wheezes.  Abdominal: Soft. There is no tenderness.  Musculoskeletal: Normal range of motion. He exhibits no edema.  Neurological:  There is normal sensation in the feet.  Skin: Skin is warm and dry.   Results for orders placed in visit on 09/29/13  GLUCOSE, POCT (MANUAL RESULT ENTRY)      Result Value Ref Range   POC Glucose 122 (*) 70 - 99 mg/dl  POCT GLYCOSYLATED HEMOGLOBIN (HGB A1C)      Result Value Ref Range   Hemoglobin A1C 6.1            Assessment & Plan:  Patient's diabetes at goal. He has gone to see the doctor. Is currently receiving injections of his back with Dr. Herma Mering. He declines referral for colonoscopy. He declines a flu shot

## 2013-10-26 ENCOUNTER — Telehealth: Payer: Self-pay

## 2013-10-26 NOTE — Telephone Encounter (Signed)
Pt states Dr. Everlene Farrier had referred him a while ago for his back, would like to be referred again the same place, wasn't sure of the name or place Please call [phone]

## 2013-10-26 NOTE — Telephone Encounter (Signed)
Pt needs to RTC for evaluation before we can refer.  Called pt to advise. Pt wife states they will be in on Saturday around 1pm

## 2013-10-27 ENCOUNTER — Ambulatory Visit (INDEPENDENT_AMBULATORY_CARE_PROVIDER_SITE_OTHER): Payer: 59 | Admitting: Emergency Medicine

## 2013-10-27 ENCOUNTER — Encounter: Payer: Self-pay | Admitting: Family Medicine

## 2013-10-27 ENCOUNTER — Telehealth: Payer: Self-pay

## 2013-10-27 VITALS — BP 127/76 | HR 61 | Temp 97.9°F | Resp 16 | Ht 65.0 in | Wt 259.0 lb

## 2013-10-27 DIAGNOSIS — M4806 Spinal stenosis, lumbar region: Secondary | ICD-10-CM

## 2013-10-27 DIAGNOSIS — M48061 Spinal stenosis, lumbar region without neurogenic claudication: Secondary | ICD-10-CM | POA: Insufficient documentation

## 2013-10-27 MED ORDER — ACETAMINOPHEN 500 MG PO TABS: 1000.0000 mg | ORAL_TABLET | Freq: Three times a day (TID) | ORAL | Status: DC | PRN

## 2013-10-27 MED ORDER — MELOXICAM 15 MG PO TABS: ORAL_TABLET | ORAL | Status: DC

## 2013-10-27 NOTE — Telephone Encounter (Signed)
Pt requests referral to see different ortho for his back. States he doesn't want to see same dr. Asking for a shot again for his back.  Dr Everlene Farrier discussed with pt he can refer to another dr for his back. Pt will check in to see Tor Netters for pain medicine and referral   bf

## 2013-10-27 NOTE — Telephone Encounter (Signed)
Patient came in to be seen his back pain was addressed. Referral made back to Dr. Christella Noa for evaluation

## 2013-10-27 NOTE — Progress Notes (Signed)
IDENTIFYING INFORMATION  Matthew Garner / DOB: 03/16/1960 / MRN: [id number]  The patient has Diabetes; Hyperlipidemia; Hypertension; Pain of left calf; and Spinal stenosis of lumbar region on his problem list.  SUBJECTIVE  Chief Complaint: Back Pain   History of present illness: Matthew Garner is a 53 y.o. year old male who presents to receive a referral to [facility] to receive epidural injections to in his back.  His last round of injections was done by a different provider, other than [facility], and he reports that his pain relief only lasted one month, where [facility] has given him pain relief for a year.  He was initially referred to [facility] for shots by Dr. Christella Noa, and would like to be referred by to this provider today.    He  has a past medical history of Diabetes mellitus; GERD (gastroesophageal reflux disease); Hyperlipidemia; Anemia; Hypertension; Snores; Sebaceous cyst (11/10/2012); and Environmental allergies (11/27/2011).  The patient has a current medication list which includes the following prescription(s): atorvastatin, blood glucose meter kit and supplies, glucose blood, lancets, losartan, metformin, ranitidine, tramadol, acetaminophen, meloxicam, and tamsulosin.  [name] has No Known Allergies. He  reports that he has never smoked. He has never used smokeless tobacco. He reports that he does not drink alcohol or use illicit drugs. He  has no sexual activity history on file.  The patient  has past surgical history that includes Spine surgery (2004); Appendectomy; and Mass excision (Bilateral, 11/24/2012).  His family history is not on file.  Review of Systems  Constitutional: Negative.   HENT: Negative.   Eyes: Negative.   Respiratory: Negative.   Cardiovascular: Negative.   Musculoskeletal: Positive for back pain (severe) and myalgias.  Skin: Negative.     OBJECTIVE  Blood pressure 127/76, pulse 61, temperature 97.9 F (36.6 C),  resp. rate 16, height '5\' 5"'  (1.651 m), weight 259 lb (117.482 kg), SpO2 97.00%. The patient's body mass index is 43.1 kg/(m^2).  Physical Exam  Constitutional: He is oriented to person, place, and time and well-developed, well-nourished, and in no distress. Vital signs are normal.  Eyes: Conjunctivae and EOM are normal. Pupils are equal, round, and reactive to light.  Musculoskeletal:       Lumbar back: He exhibits tenderness, pain and spasm.  Neurological: He is oriented to person, place, and time. Gait abnormal.  Reflex Scores:      Patellar reflexes are 1+ on the right side and 1+ on the left side.      Achilles reflexes are 2+ on the right side and 2+ on the left side. Skin: Skin is warm and dry.  Psychiatric: Mood, memory, affect and judgment normal.    No results found for this or any previous visit (from the past 24 hour(s)).  ASSESSMENT & PLAN  Brave was seen today for back pain.  Diagnoses and associated orders for this visit:  Spinal stenosis of lumbar region - Cancel: Ambulatory referral to Orthopedic Surgery - meloxicam (MOBIC) 15 MG tablet; Take once daily with food. - acetaminophen (TYLENOL) 500 MG tablet; Take 2 tablets (1,000 mg total) by mouth every 8 (eight) hours as needed for moderate pain. Take 2 tabs every 8 hours. - Ambulatory referral to Neurology     The patient was instructed to to call or comeback to clinic as needed, or should symptoms warrant.  Philis Fendt, MHS, PA-C [facility] 10/27/2013 12:27 PM

## 2013-11-02 LAB — HM DIABETES EYE EXAM

## 2013-11-06 ENCOUNTER — Other Ambulatory Visit: Payer: Self-pay | Admitting: Neurosurgery

## 2013-11-06 DIAGNOSIS — M4726 Other spondylosis with radiculopathy, lumbar region: Secondary | ICD-10-CM

## 2013-11-09 ENCOUNTER — Ambulatory Visit
Admission: RE | Admit: 2013-11-09 | Discharge: 2013-11-09 | Disposition: A | Discharge status: Home / Self Care | Payer: 59 | Source: Ambulatory Visit | Attending: Neurosurgery | Admitting: Neurosurgery

## 2013-11-09 DIAGNOSIS — M4726 Other spondylosis with radiculopathy, lumbar region: Secondary | ICD-10-CM

## 2013-11-09 MED ORDER — IOHEXOL 180 MG/ML  SOLN
1.0000 mL | Freq: Once | INTRAMUSCULAR | Status: AC | PRN
  Administered 2013-11-09: 1 mL via EPIDURAL

## 2013-11-09 MED ORDER — METHYLPREDNISOLONE ACETATE 40 MG/ML INJ SUSP (RADIOLOG
120.0000 mg | Freq: Once | INTRAMUSCULAR | Status: AC
  Administered 2013-11-09: 120 mg via EPIDURAL

## 2013-11-09 NOTE — Discharge Instructions (Signed)

## 2014-01-26 ENCOUNTER — Ambulatory Visit (INDEPENDENT_AMBULATORY_CARE_PROVIDER_SITE_OTHER): Payer: 59 | Admitting: Emergency Medicine

## 2014-01-26 ENCOUNTER — Encounter: Payer: Self-pay | Admitting: Emergency Medicine

## 2014-01-26 VITALS — BP 130/78 | HR 72 | Temp 98.8°F | Resp 16 | Ht 64.0 in | Wt 266.3 lb

## 2014-01-26 DIAGNOSIS — M48061 Spinal stenosis, lumbar region without neurogenic claudication: Secondary | ICD-10-CM

## 2014-01-26 DIAGNOSIS — E119 Type 2 diabetes mellitus without complications: Secondary | ICD-10-CM

## 2014-01-26 DIAGNOSIS — M79662 Pain in left lower leg: Secondary | ICD-10-CM

## 2014-01-26 DIAGNOSIS — M4806 Spinal stenosis, lumbar region: Secondary | ICD-10-CM

## 2014-01-26 DIAGNOSIS — I10 Essential (primary) hypertension: Secondary | ICD-10-CM

## 2014-01-26 LAB — POCT GLYCOSYLATED HEMOGLOBIN (HGB A1C): Hemoglobin A1C: 6.2

## 2014-01-26 MED ORDER — MELOXICAM 15 MG PO TABS: ORAL_TABLET | ORAL | Status: DC

## 2014-01-26 MED ORDER — LOSARTAN POTASSIUM 50 MG PO TABS: 50.0000 mg | ORAL_TABLET | Freq: Every morning | ORAL | Status: DC

## 2014-01-26 MED ORDER — METFORMIN HCL 500 MG PO TABS: 500.0000 mg | ORAL_TABLET | Freq: Two times a day (BID) | ORAL | Status: DC

## 2014-01-26 NOTE — Progress Notes (Signed)
Subjective:    Patient ID: Matthew Garner, male    DOB: 04/10/1960, 54 y.o.   MRN: [id number]  HPI  This is a 54 year old male with PMH DMII, HTN and HLD who is presenting for follow up.  Left calf pain: tightness x 1-2 years and worsening. He works at [employer] and reports after an 8 hour shift he can barely lift his left leg. He had an u/s 2 years ago and normal. He was seen at sports medicine and they gave him shoe inserts which he reports did not work. Tib/fib xrays have been normal. He has spinal stenosis and the thought was that his leg pain could be coming from his back. Epidural injections did not help his leg. He is not a smoker. He denies SOB, CP. No history of blood clots.  DMII: He doesn't check blood sugar. Last A1C 3 months ago 6.1. He reports he has not changed his diet. He is middle [place] and eat lots of bread and potatoes. Only exercise is walking he does at work.  Review of Systems  Constitutional: Negative for fever and chills.  Respiratory: Negative for shortness of breath.   Cardiovascular: Negative for chest pain, palpitations and leg swelling.  Gastrointestinal: Negative for abdominal pain, diarrhea and constipation.  Genitourinary: Negative for difficulty urinating.  Musculoskeletal: Positive for myalgias.  Skin: Negative for rash.  Neurological: Negative for weakness and numbness.    Patient Active Problem List   Diagnosis Date Noted  . Spinal stenosis of lumbar region 10/27/2013  . Pain of left calf 06/16/2013  . Diabetes 11/27/2011  . Hyperlipidemia 11/27/2011  . Hypertension 11/27/2011   Prior to Admission medications   Medication Sig Start Date End Date Taking? Authorizing Provider  acetaminophen (TYLENOL) 500 MG tablet Take 2 tablets (1,000 mg total) by mouth every 8 (eight) hours as needed for moderate pain. Take 2 tabs every 8 hours. 10/27/13  Yes Kathlen Brunswick, PA-C  atorvastatin (LIPITOR) 20 MG tablet TAKE 1 TABLET BY MOUTH EVERY DAY  06/16/13  Yes [name], MD  Blood Glucose Monitoring Suppl (BLOOD GLUCOSE METER) kit Use as instructed 02/03/13  Yes [name], MD  glucose blood test strip To check blood sugars once daily dx code 250.00 02/03/13  Yes [name], MD  Lancets MISC To check blood sugars once daily, dx code 250.00 02/03/13  Yes [name], MD  losartan (COZAAR) 50 MG tablet Take 1 tablet (50 mg total) by mouth every morning. 09/29/13  Yes [name], MD  meloxicam (MOBIC) 15 MG tablet Take once daily with food. 10/27/13  Yes Kathlen Brunswick, PA-C  metFORMIN (GLUCOPHAGE) 500 MG tablet Take 500 mg by mouth 2 (two) times daily with a meal.   Yes Historical Provider, MD  ranitidine (ZANTAC) 300 MG tablet Take 1 tablet (300 mg total) by mouth at bedtime. 06/16/13  Yes [name], MD                 No Known Allergies  Patient's social and family history were reviewed.     Objective:   Physical Exam  Constitutional: He is oriented to person, place, and time. He appears well-developed and well-nourished. No distress.  HENT:  Head: Normocephalic and atraumatic.  Right Ear: Hearing normal.  Left Ear: Hearing normal.  Nose: Nose normal.  Eyes: Conjunctivae and lids are normal. Right eye exhibits no discharge. Left eye exhibits no discharge. No scleral icterus.  Cardiovascular: Normal rate, regular rhythm, normal heart sounds, intact distal pulses and normal pulses.   No murmur heard. Pulmonary/Chest: Effort normal and breath sounds normal. No respiratory distress. He has no wheezes. He has no rhonchi. He has no rales.  Musculoskeletal: Normal range of motion.       Left ankle: Normal. He exhibits normal pulse. Achilles tendon normal.       Left lower leg: He exhibits tenderness (mid calf to proximal thigh. pain in popliteal space.). He exhibits no swelling.  Bilateral calves 45 cm circumference   Neurological: He is alert and oriented to person, place, and time. He has normal strength. No sensory  deficit.  Reflex Scores:      Patellar reflexes are 0 on the right side and 0 on the left side. Bilateral absent patellar reflexes  Skin: Skin is warm, dry and intact. No lesion and no rash noted.  Psychiatric: He has a normal mood and affect. His speech is normal and behavior is normal. Thought content normal.   BP 130/78 mmHg  Pulse 72  Temp(Src) 98.8 F (37.1 C) (Oral)  Resp 16  Ht '5\' 4"'  (1.626 m)  Wt 266 lb 4.8 oz (120.793 kg)  BMI 45.69 kg/m2  SpO2 97%     Assessment & Plan:  1. Diabetes mellitus without complication J0V 6.2 today, 6.1 3 months ago. Continue with metformin 500 md BID. Encouraged to exercise and watch white foods.  - HM Diabetes Foot Exam - POCT glycosylated hemoglobin (Hb A1C) - metFORMIN (GLUCOPHAGE) 500 MG tablet; Take 1 tablet (500 mg total) by mouth 2 (two) times daily with a meal.  Dispense: 180 tablet; Refill: 3  3. Essential hypertension - losartan (COZAAR) 50 MG tablet; Take 1 tablet (50 mg total) by mouth every morning.  Dispense: 90 tablet; Refill: 2  4. Spinal stenosis of lumbar region - meloxicam (MOBIC) 15 MG tablet; Take once daily with food.  Dispense: 30 tablet; Refill: 1  5. Calf pain, left Pt is prescribed mobic to take prn for back pain. He was instructed to take daily for 10-14 days. Will send for U/S - unlikely to be blood clot as he has had pain for quite some time and U/S two years ago normal. Referred to ortho. Would probably benefit from PT.  - meloxicam (MOBIC) 15 MG tablet; Take once daily with food.  Dispense: 30 tablet; Refill: 1 - Lower Extremity Venous Duplex Left; Future - Ambulatory referral to Orthopedic Surgery   Benjaman Pott. Drenda Freeze, MHS [facility]  01/26/2014

## 2014-02-10 ENCOUNTER — Encounter (HOSPITAL_COMMUNITY): Payer: Self-pay

## 2014-02-10 ENCOUNTER — Other Ambulatory Visit (HOSPITAL_COMMUNITY): Payer: Self-pay | Admitting: Sports Medicine

## 2014-02-10 ENCOUNTER — Ambulatory Visit (HOSPITAL_COMMUNITY)
Admission: RE | Admit: 2014-02-10 | Discharge: 2014-02-10 | Disposition: A | Discharge status: Home / Self Care | Payer: 59 | Source: Ambulatory Visit | Attending: Sports Medicine | Admitting: Sports Medicine

## 2014-02-10 DIAGNOSIS — M79605 Pain in left leg: Secondary | ICD-10-CM | POA: Insufficient documentation

## 2014-02-10 NOTE — Progress Notes (Signed)
Left Lower Ext. Venous Duplex Completed. Oda Cogan, BS, RDMS, RVT

## 2014-02-16 ENCOUNTER — Telehealth (HOSPITAL_COMMUNITY): Payer: Self-pay | Admitting: *Deleted

## 2014-04-21 ENCOUNTER — Telehealth: Payer: Self-pay

## 2014-04-21 ENCOUNTER — Encounter (HOSPITAL_COMMUNITY): Payer: Self-pay | Admitting: Emergency Medicine

## 2014-04-21 ENCOUNTER — Emergency Department (HOSPITAL_COMMUNITY)
Admission: EM | Admit: 2014-04-21 | Discharge: 2014-04-21 | Disposition: A | Discharge status: Home / Self Care | Payer: 59 | Attending: Emergency Medicine | Admitting: Emergency Medicine

## 2014-04-21 ENCOUNTER — Ambulatory Visit (INDEPENDENT_AMBULATORY_CARE_PROVIDER_SITE_OTHER): Payer: 59

## 2014-04-21 ENCOUNTER — Ambulatory Visit (INDEPENDENT_AMBULATORY_CARE_PROVIDER_SITE_OTHER): Payer: 59 | Admitting: Emergency Medicine

## 2014-04-21 ENCOUNTER — Emergency Department (HOSPITAL_COMMUNITY): Payer: 59

## 2014-04-21 VITALS — BP 110/64 | HR 88 | Temp 98.1°F | Resp 18 | Ht 65.0 in | Wt 259.0 lb

## 2014-04-21 DIAGNOSIS — I10 Essential (primary) hypertension: Secondary | ICD-10-CM | POA: Diagnosis not present

## 2014-04-21 DIAGNOSIS — R509 Fever, unspecified: Secondary | ICD-10-CM | POA: Diagnosis not present

## 2014-04-21 DIAGNOSIS — R001 Bradycardia, unspecified: Secondary | ICD-10-CM | POA: Diagnosis not present

## 2014-04-21 DIAGNOSIS — E119 Type 2 diabetes mellitus without complications: Secondary | ICD-10-CM | POA: Diagnosis not present

## 2014-04-21 DIAGNOSIS — R05 Cough: Secondary | ICD-10-CM

## 2014-04-21 DIAGNOSIS — R42 Dizziness and giddiness: Secondary | ICD-10-CM

## 2014-04-21 DIAGNOSIS — R059 Cough, unspecified: Secondary | ICD-10-CM

## 2014-04-21 DIAGNOSIS — E785 Hyperlipidemia, unspecified: Secondary | ICD-10-CM | POA: Insufficient documentation

## 2014-04-21 DIAGNOSIS — D709 Neutropenia, unspecified: Secondary | ICD-10-CM

## 2014-04-21 DIAGNOSIS — J111 Influenza due to unidentified influenza virus with other respiratory manifestations: Secondary | ICD-10-CM | POA: Insufficient documentation

## 2014-04-21 DIAGNOSIS — J029 Acute pharyngitis, unspecified: Secondary | ICD-10-CM

## 2014-04-21 DIAGNOSIS — Z791 Long term (current) use of non-steroidal anti-inflammatories (NSAID): Secondary | ICD-10-CM | POA: Insufficient documentation

## 2014-04-21 DIAGNOSIS — Z872 Personal history of diseases of the skin and subcutaneous tissue: Secondary | ICD-10-CM | POA: Diagnosis not present

## 2014-04-21 DIAGNOSIS — R9431 Abnormal electrocardiogram [ECG] [EKG]: Secondary | ICD-10-CM | POA: Diagnosis not present

## 2014-04-21 DIAGNOSIS — K219 Gastro-esophageal reflux disease without esophagitis: Secondary | ICD-10-CM | POA: Insufficient documentation

## 2014-04-21 LAB — COMPREHENSIVE METABOLIC PANEL
ALT: 23 U/L (ref 0–53)
AST: 24 U/L (ref 0–37)
Albumin: 3.8 g/dL (ref 3.5–5.2)
Alkaline Phosphatase: 67 U/L (ref 39–117)
Anion gap: 9 (ref 5–15)
BILIRUBIN TOTAL: 0.5 mg/dL (ref 0.3–1.2)
BUN: 12 mg/dL (ref 6–23)
CALCIUM: 8.5 mg/dL (ref 8.4–10.5)
CHLORIDE: 105 mmol/L (ref 96–112)
CO2: 25 mmol/L (ref 19–32)
Creatinine, Ser: 0.74 mg/dL (ref 0.50–1.35)
GLUCOSE: 112 mg/dL — AB (ref 70–99)
Potassium: 3.7 mmol/L (ref 3.5–5.1)
Sodium: 139 mmol/L (ref 135–145)
Total Protein: 7 g/dL (ref 6.0–8.3)

## 2014-04-21 LAB — CBC WITH DIFFERENTIAL/PLATELET
Basophils Absolute: 0.1 10*3/uL (ref 0.0–0.1)
Basophils Relative: 1 % (ref 0–1)
Eosinophils Absolute: 0.1 10*3/uL (ref 0.0–0.7)
Eosinophils Relative: 3 % (ref 0–5)
HCT: 42 % (ref 39.0–52.0)
Hemoglobin: 14.1 g/dL (ref 13.0–17.0)
LYMPHS PCT: 38 % (ref 12–46)
Lymphs Abs: 1.3 10*3/uL (ref 0.7–4.0)
MCH: 29.2 pg (ref 26.0–34.0)
MCHC: 33.6 g/dL (ref 30.0–36.0)
MCV: 87 fL (ref 78.0–100.0)
MONO ABS: 0.8 10*3/uL (ref 0.1–1.0)
MONOS PCT: 21 % — AB (ref 3–12)
NEUTROS ABS: 1.3 10*3/uL — AB (ref 1.7–7.7)
Neutrophils Relative %: 37 % — ABNORMAL LOW (ref 43–77)
Platelets: 229 10*3/uL (ref 150–400)
RBC: 4.83 MIL/uL (ref 4.22–5.81)
RDW: 12.8 % (ref 11.5–15.5)
WBC: 3.5 10*3/uL — ABNORMAL LOW (ref 4.0–10.5)

## 2014-04-21 LAB — POCT CBC
Granulocyte percent: 53.5 %G (ref 37–80)
HCT, POC: 44.3 % (ref 43.5–53.7)
Hemoglobin: 15.2 g/dL (ref 14.1–18.1)
LYMPH, POC: 1.1 (ref 0.6–3.4)
MCH, POC: 29.4 pg (ref 27–31.2)
MCHC: 34.3 g/dL (ref 31.8–35.4)
MCV: 85.7 fL (ref 80–97)
MID (CBC): 0.7 (ref 0–0.9)
MPV: 6.8 fL (ref 0–99.8)
PLATELET COUNT, POC: 271 10*3/uL (ref 142–424)
POC GRANULOCYTE: 2 (ref 2–6.9)
POC LYMPH %: 29.2 % (ref 10–50)
POC MID %: 17.3 %M — AB (ref 0–12)
RBC: 5.17 M/uL (ref 4.69–6.13)
RDW, POC: 13.1 %
WBC: 3.8 10*3/uL — AB (ref 4.6–10.2)

## 2014-04-21 LAB — POCT RAPID STREP A (OFFICE): RAPID STREP A SCREEN: NEGATIVE

## 2014-04-21 LAB — POCT INFLUENZA A/B
INFLUENZA A, POC: NEGATIVE
INFLUENZA B, POC: NEGATIVE

## 2014-04-21 LAB — LIPASE, BLOOD: LIPASE: 15 U/L (ref 11–59)

## 2014-04-21 LAB — GLUCOSE, POCT (MANUAL RESULT ENTRY): POC Glucose: 124 mg/dl — AB (ref 70–99)

## 2014-04-21 MED ORDER — ALBUTEROL SULFATE (2.5 MG/3ML) 0.083% IN NEBU
2.5000 mg | INHALATION_SOLUTION | Freq: Once | RESPIRATORY_TRACT | Status: AC
  Administered 2014-04-21: 2.5 mg via RESPIRATORY_TRACT
  Filled 2014-04-21: qty 3

## 2014-04-21 MED ORDER — ONDANSETRON HCL 4 MG/2ML IJ SOLN
4.0000 mg | Freq: Once | INTRAMUSCULAR | Status: AC
  Administered 2014-04-21: 4 mg via INTRAVENOUS

## 2014-04-21 MED ORDER — HYDROCODONE-HOMATROPINE 5-1.5 MG/5ML PO SYRP: 5.0000 mL | ORAL_SOLUTION | Freq: Four times a day (QID) | ORAL | Status: DC | PRN

## 2014-04-21 NOTE — ED Provider Notes (Signed)
CSN: [id number]     Arrival date & time 04/21/14  1146 History   None    Chief Complaint  Patient presents with  . Cough  . Abdominal Pain  . Nausea     (Consider location/radiation/quality/duration/timing/severity/associated sxs/prior Treatment) Patient is a 54 y.o. male presenting with cough and abdominal pain. The history is provided by the patient. No language interpreter was used.  Cough Associated symptoms: no sore throat   Abdominal Pain Associated symptoms: cough   Associated symptoms: no sore throat   Matthew Garner is a 54 y.o male with a history of diabetes, htn, and hyperlipidemia who presents with new onset, worsening fever, chills, cough for 2 days and influenza that was diagnosed by his pcp earlier today. He states his the cough is making his chest hurt.  He has taken tylenol without relief. Nothing makes it worse or better. He denies any headache, vision changes, palpitations, abdominal pain, vomiting, constipation, or leg swelling.    Past Medical History  Diagnosis Date  . Diabetes mellitus   . GERD (gastroesophageal reflux disease)   . Hyperlipidemia   . Anemia   . Hypertension   . Snores   . Sebaceous cyst 11/10/2012  . Environmental allergies 11/27/2011   Past Surgical History  Procedure Laterality Date  . Spine surgery  2004    lumb  . Appendectomy    . Mass excision Bilateral 11/24/2012    Procedure: EXCISION SCALP MASS X2;  Surgeon: [name], MD;  Location: [facility];  Service: General;  Laterality: Bilateral;   No family history on file. History  Substance Use Topics  . Smoking status: Never Smoker   . Smokeless tobacco: Never Used  . Alcohol Use: No    Review of Systems  HENT: Negative for sore throat.   Respiratory: Positive for cough.   Gastrointestinal: Positive for abdominal pain.  Neurological: Negative for dizziness and light-headedness.      Allergies  Review of patient's allergies indicates no known  allergies.  Home Medications   Prior to Admission medications   Medication Sig Start Date End Date Taking? Authorizing Provider  acetaminophen (TYLENOL) 500 MG tablet Take 1,000 mg by mouth every 6 (six) hours as needed for mild pain or moderate pain.   Yes Historical Provider, MD  atorvastatin (LIPITOR) 20 MG tablet TAKE 1 TABLET BY MOUTH EVERY DAY Patient taking differently: Take 20 mg by mouth daily.  06/16/13  Yes [name], MD  losartan (COZAAR) 50 MG tablet Take 1 tablet (50 mg total) by mouth every morning. 01/26/14  Yes Bennett Scrape V, PA-C  metFORMIN (GLUCOPHAGE) 500 MG tablet Take 1 tablet (500 mg total) by mouth 2 (two) times daily with a meal. 01/26/14  Yes Bennett Scrape V, PA-C  ranitidine (ZANTAC) 300 MG tablet Take 1 tablet (300 mg total) by mouth at bedtime. 06/16/13  Yes [name], MD  Blood Glucose Monitoring Suppl (BLOOD GLUCOSE METER) kit Use as instructed 02/03/13   [name], MD  glucose blood test strip To check blood sugars once daily dx code 250.00 02/03/13   [name], MD  HYDROcodone-homatropine [facility]) 5-1.5 MG/5ML syrup Take 5 mLs by mouth every 6 (six) hours as needed for cough. 04/21/14   Matthew Glazier, PA-C  Lancets MISC To check blood sugars once daily, dx code 250.00 02/03/13   [name], MD  meloxicam (MOBIC) 15 MG tablet Take once daily with food. 01/26/14   Ezekiel Slocumb,  PA-C   BP 112/61 mmHg  Pulse 52  Temp(Src) 98.5 F (36.9 C)  Resp 22  SpO2 99% Physical Exam  Constitutional: He is oriented to person, place, and time. He appears well-developed and well-nourished.  HENT:  Mouth/Throat: Oropharynx is clear and moist.  Eyes: Conjunctivae are normal.  Neck: Normal range of motion. Neck supple.  Cardiovascular: Normal rate, regular rhythm and normal heart sounds.   Pulmonary/Chest: Effort normal and breath sounds normal. No respiratory distress. He has no wheezes. He has no rales.  Abdominal: Soft. There is no tenderness.   Musculoskeletal: Normal range of motion.  Neurological: He is alert and oriented to person, place, and time.  Skin: Skin is warm and dry.  Nursing note and vitals reviewed.   ED Course  Procedures (including critical care time) Labs Review Labs Reviewed  CBC WITH DIFFERENTIAL/PLATELET - Abnormal; Notable for the following:    WBC 3.5 (*)    Neutrophils Relative % 37 (*)    Neutro Abs 1.3 (*)    Monocytes Relative 21 (*)    All other components within normal limits  COMPREHENSIVE METABOLIC PANEL - Abnormal; Notable for the following:    Glucose, Bld 112 (*)    All other components within normal limits  LIPASE, BLOOD    Imaging Review Dg Chest 2 View  04/21/2014   CLINICAL DATA:  Cough and nausea.  Abdominal pain.  EXAM: CHEST  2 VIEW 1:11 p.m.  COMPARISON:  04/21/2014 8:14 a.m.  FINDINGS: The heart size and mediastinal contours are within normal limits. Both lungs are clear. The visualized skeletal structures are unremarkable.  IMPRESSION: Normal exam.   Electronically Signed   By: Lorriane Shire M.D.   On: 04/21/2014 13:42   Dg Chest 2 View  04/21/2014   CLINICAL DATA:  Cough.  Wheezing.  Fatigue.  EXAM: CHEST  2 VIEW  COMPARISON:  None.  FINDINGS: The heart size and mediastinal contours are within normal limits. Both lungs are clear. The visualized skeletal structures are unremarkable.  IMPRESSION: Normal exam.   Electronically Signed   By: Lorriane Shire M.D.   On: 04/21/2014 09:51     EKG Interpretation None      MDM   Final diagnoses:  Influenza  Neutropenia  Patient presents with cough and diagnosis of influenza by pcp prior to arrival.  He states his PCP sent him here.    I gave him an albuterol neb treatment during his visit.  His labs show neutropenia but no other abnormalities.  CXR shows no pneumonia or edema. He appears comfortable after breathing treatment and is afebrile.  I gave him hycodan for cough and told him to take tylenol or motrin for fever.  He asked  me to write him a work note for 2 weeks off which I refused and told him that I could write it for 2 days.  He agrees to follow up with his pcp.     Matthew Glazier, PA-C 04/21/14 1527  Ripley Fraise, MD 04/22/14 1447

## 2014-04-21 NOTE — Progress Notes (Deleted)
   Subjective:    Patient ID: Matthew Garner, male    DOB: 09-Sep-1960, 54 y.o.   MRN: [id number]  HPI    Review of Systems     Objective:   Physical Exam        Assessment & Plan:

## 2014-04-21 NOTE — Progress Notes (Addendum)
Subjective:  This chart was scribed for Arlyss Queen, MD by Donato Schultz, Medical Scribe. This patient was seen in Room 8 and the patient's care was started at 8:51 AM.   Patient ID: Matthew Garner, male    DOB: Dec 18, 1960, 54 y.o.   MRN: [id number]  Cough Associated symptoms include wheezing. Pertinent negatives include no fever or shortness of breath.  Dizziness Associated symptoms include coughing and fatigue. Pertinent negatives include no fever.   HPI Comments: Matthew Garner is a 54 y.o. male who presents to the [facility] complaining of constant cough that started two days ago.  He states he was in the park on Sunday and started getting a headache which he believes was contributed to being in the sun for a long period of time.  He did not take a flu shot this year.  He is not coughing up blood but states that he can taste it when he coughs.  He denies sick contacts.  He lists dizziness, wheezing, decreased appetite, and fatigue as associated symptoms.   He describes his dizziness as feeling like the room is spinning but was able to drive to [place] today.  He has not eaten in the last two days but his sugar levels have been normal.  He denies fever and SOB as associated symptoms.  Past Medical History  Diagnosis Date  . Diabetes mellitus   . GERD (gastroesophageal reflux disease)   . Hyperlipidemia   . Anemia   . Hypertension   . Snores   . Sebaceous cyst 11/10/2012  . Environmental allergies 11/27/2011   Past Surgical History  Procedure Laterality Date  . Spine surgery  2004    lumb  . Appendectomy    . Mass excision Bilateral 11/24/2012    Procedure: EXCISION SCALP MASS X2;  Surgeon: [name], MD;  Location: [place];  Service: General;  Laterality: Bilateral;   History reviewed. No pertinent family history. History   Social History  . Marital Status: Married    Spouse Name: N/A  . Number of Children: N/A  . Years of Education: N/A    Occupational History  . Not on file.   Social History Main Topics  . Smoking status: Never Smoker   . Smokeless tobacco: Never Used  . Alcohol Use: No  . Drug Use: No  . Sexual Activity: Not on file   Other Topics Concern  . Not on file   Social History Narrative   No Known Allergies  Review of Systems  Constitutional: Positive for appetite change and fatigue. Negative for fever.  Respiratory: Positive for cough and wheezing. Negative for shortness of breath.   Neurological: Positive for dizziness.     Objective:  Physical Exam  Constitutional: He is oriented to person, place, and time. He appears well-developed and well-nourished.  Patient sitting in the room tachypneic with frequent episodes of cough.  HENT:  Head: Normocephalic and atraumatic.  Mouth/Throat: Posterior oropharyngeal erythema (slight) present.  Right ear canal has wax.   Eyes: EOM are normal.  Neck: Normal range of motion.  Cardiovascular: Normal rate, regular rhythm and normal heart sounds.   No murmur heard. Pulmonary/Chest: Effort normal and breath sounds normal. No respiratory distress. He has no wheezes. He has no rales.  Symmetrical breath sounds.  No rales.  Musculoskeletal: Normal range of motion.  Neurological: He is alert and oriented to person, place, and time.  Skin: Skin is warm and dry.  Psychiatric: He  has a normal mood and affect. His behavior is normal.  Nursing note and vitals reviewed.  [facility] (PRIMARY) x-ray report read by Dr. Everlene Farrier: There is poor inspiratory effort but no consolidated areas are seen. Results for orders placed or performed in visit on 04/21/14  POCT Influenza A/B  Result Value Ref Range   Influenza A, POC Negative    Influenza B, POC Negative   POCT rapid strep A  Result Value Ref Range   Rapid Strep A Screen Negative Negative  POCT CBC  Result Value Ref Range   WBC 3.8 (A) 4.6 - 10.2 K/uL   Lymph, poc 1.1 0.6 - 3.4   POC LYMPH PERCENT 29.2 10 - 50 %L    MID (cbc) 0.7 0 - 0.9   POC MID % 17.3 (A) 0 - 12 %M   POC Granulocyte 2.0 2 - 6.9   Granulocyte percent 53.5 37 - 80 %G   RBC 5.17 4.69 - 6.13 M/uL   Hemoglobin 15.2 14.1 - 18.1 g/dL   HCT, POC 44.3 43.5 - 53.7 %   MCV 85.7 80 - 97 fL   MCH, POC 29.4 27 - 31.2 pg   MCHC 34.3 31.8 - 35.4 g/dL   RDW, POC 13.1 %   Platelet Count, POC 271 142 - 424 K/uL   MPV 6.8 0 - 99.8 fL  POCT glucose (manual entry)  Result Value Ref Range   POC Glucose 124 (A) 70 - 99 mg/dl    BP 110/64 mmHg  Pulse 88  Temp(Src) 98.3 F (36.8 C) (Oral)  Resp 18  Ht 5\' 5"  (1.651 m)  Wt 259 lb (117.482 kg)  BMI 43.10 kg/m2  SpO2 97% Assessment & Plan:  Patient given 1 L of IV fluids. His white count is low at 3800 and he has not had a flu shot. I suspect this is a viral type syndrome. He has had intermittent bradycardia with heart rates down to 44 associated with this illness. His EKG does not show acute changes but I'm concerned about his bradycardia with a significant first-degree block . The episodes of bradycardia were not associated with coughing episodes. I was concerned because patient had lightheaded dizzy sensation on his way to the office while he was driving. He will be sent to the hospital for further evaluation.I personally performed the services described in this documentation, which was scribed in my presence. The recorded information has been reviewed and is accurate.

## 2014-04-21 NOTE — ED Notes (Signed)
Bed: WA08 Expected date:  Expected time:  Means of arrival:  Comments: EMS 

## 2014-04-21 NOTE — Discharge Instructions (Signed)
Influenza Take cough medicine as prescribed.  Take tylenol or motrin for fever.  Follow up with your primary care physician for neutropenia.  Influenza ("the flu") is a viral infection of the respiratory tract. It occurs more often in winter months because people spend more time in close contact with one another. Influenza can make you feel very sick. Influenza easily spreads from person to person (contagious). CAUSES  Influenza is caused by a virus that infects the respiratory tract. You can catch the virus by breathing in droplets from an infected person's cough or sneeze. You can also catch the virus by touching something that was recently contaminated with the virus and then touching your mouth, nose, or eyes. RISKS AND COMPLICATIONS You may be at risk for a more severe case of influenza if you smoke cigarettes, have diabetes, have chronic heart disease (such as heart failure) or lung disease (such as asthma), or if you have a weakened immune system. Elderly people and pregnant women are also at risk for more serious infections. The most common problem of influenza is a lung infection (pneumonia). Sometimes, this problem can require emergency medical care and may be life threatening. SIGNS AND SYMPTOMS  Symptoms typically last 4 to 10 days and may include:  Fever.  Chills.  Headache, body aches, and muscle aches.  Sore throat.  Chest discomfort and cough.  Poor appetite.  Weakness or feeling tired.  Dizziness.  Nausea or vomiting. DIAGNOSIS  Diagnosis of influenza is often made based on your history and a physical exam. A nose or throat swab test can be done to confirm the diagnosis. TREATMENT  In mild cases, influenza goes away on its own. Treatment is directed at relieving symptoms. For more severe cases, your health care provider may prescribe antiviral medicines to shorten the sickness. Antibiotic medicines are not effective because the infection is caused by a virus, not by  bacteria. HOME CARE INSTRUCTIONS  Take medicines only as directed by your health care provider.  Use a cool mist humidifier to make breathing easier.  Get plenty of rest until your temperature returns to normal. This usually takes 3 to 4 days.  Drink enough fluid to keep your urine clear or pale yellow.  Cover yourmouth and nosewhen coughing or sneezing,and wash your handswellto prevent thevirusfrom spreading.  Stay homefromwork orschool untilthe fever is gonefor at least 57full day. PREVENTION  An annual influenza vaccination (flu shot) is the best way to avoid getting influenza. An annual flu shot is now routinely recommended for all adults in the [place] IF:  You experiencechest pain, yourcough worsens,or you producemore mucus.  Youhave nausea,vomiting, ordiarrhea.  Your fever returns or gets worse. SEEK IMMEDIATE MEDICAL CARE IF:  You havetrouble breathing, you become short of breath,or your skin ornails becomebluish.  You have severe painor stiffnessin the neck.  You develop a sudden headache, or pain in the face or ear.  You have nausea or vomiting that you cannot control. MAKE SURE YOU:   Understand these instructions.  Will watch your condition.  Will get help right away if you are not doing well or get worse. Document Released: 12/16/1999 Document Revised: 05/04/2013 Document Reviewed: 03/19/2011 [facility] Patient Information 2015 [place], [place]. This information is not intended to replace advice given to you by your health care provider. Make sure you discuss any questions you have with your health care provider.

## 2014-04-21 NOTE — Telephone Encounter (Signed)
Patient called in stating that he was seen in office on 4/20 and was sent to the ER and that he has the flu and will need a work note for today and would like to get one for two weeks to cover him so he doesn't get in trouble with his job and so he can get over the flu, he stated that the lady at the ER would not give him a note and that he needed to contact his PCP. His call back number is [phone].

## 2014-04-21 NOTE — ED Notes (Signed)
Called [facility], to make them aware nebulizer treatment ordered.  unavailable at this time. RN to administer.

## 2014-04-21 NOTE — ED Notes (Signed)
Gave pt ginger ale.  

## 2014-04-21 NOTE — ED Notes (Signed)
Per EMS: Pt from [place] office c/o cough, nausea, and gen abd pain x 1 day.  No vomiting.  No cp or SOB.  Pt has 20 g in lt AC put in place by dr's office staff.  Almost a full liter infused at this point.

## 2014-04-22 NOTE — Telephone Encounter (Signed)
Ok. Designer, fashion/clothing. Advised ready to pick up.

## 2014-04-22 NOTE — Telephone Encounter (Signed)
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 He may need FMLA.

## 2014-04-22 NOTE — Telephone Encounter (Signed)
Okay to give the patient note to 2 weeks for recovery of the flu. He does need to see me next week.

## 2014-04-27 ENCOUNTER — Encounter: Payer: Self-pay | Admitting: Emergency Medicine

## 2014-04-27 ENCOUNTER — Ambulatory Visit (INDEPENDENT_AMBULATORY_CARE_PROVIDER_SITE_OTHER): Payer: 59 | Admitting: Emergency Medicine

## 2014-04-27 VITALS — BP 123/79 | HR 72 | Temp 98.5°F | Resp 16 | Ht 64.25 in | Wt 257.8 lb

## 2014-04-27 DIAGNOSIS — R059 Cough, unspecified: Secondary | ICD-10-CM

## 2014-04-27 DIAGNOSIS — E119 Type 2 diabetes mellitus without complications: Secondary | ICD-10-CM

## 2014-04-27 DIAGNOSIS — R05 Cough: Secondary | ICD-10-CM

## 2014-04-27 DIAGNOSIS — B349 Viral infection, unspecified: Secondary | ICD-10-CM

## 2014-04-27 LAB — POCT GLYCOSYLATED HEMOGLOBIN (HGB A1C): Hemoglobin A1C: 6.1

## 2014-04-27 LAB — GLUCOSE, POCT (MANUAL RESULT ENTRY): POC GLUCOSE: 108 mg/dL — AB (ref 70–99)

## 2014-04-27 NOTE — Progress Notes (Signed)
   Subjective:    Patient ID: Matthew Garner, male    DOB: February 13, 1960, 54 y.o.   MRN: [id number] This chart was scribed for Arlyss Queen, MD by Zola Button, Medical Scribe. This patient was seen in room 23 and the patient's care was started at 4:12 PM.   HPI HPI Comments: Matthew Garner is a 54 y.o. male with a hx of DM, HLD, and HTN who presents to the [facility] for a follow-up. Patient still has cough, dizziness, mild fever, and fatigue, but he notes overall improvement to his symptoms. I last saw him last Wednesday, 6 days ago, for influenza, and I sent him to hospital for IV fluids. His son has also been sick, but his wife has been fine.   Review of Systems  Constitutional: Positive for fever and fatigue.  Respiratory: Positive for cough.   Neurological: Positive for dizziness.       Objective:   Physical Exam CONSTITUTIONAL: Well developed/well nourished HEAD: Normocephalic/atraumatic EYES: EOM/PERRL ENMT: Mucous membranes moist NECK: supple no meningeal signs SPINE: entire spine nontender CV: S1/S2 noted, no murmurs/rubs/gallops noted LUNGS: Lungs are clear to auscultation bilaterally, no apparent distress ABDOMEN: soft, nontender, no rebound or guarding GU: no cva tenderness NEURO: Pt is awake/alert, moves all extremitiesx4 EXTREMITIES: pulses normal, full ROM SKIN: warm, color normal PSYCH: no abnormalities of mood noted  Results for orders placed or performed in visit on 04/27/14  POCT glucose (manual entry)  Result Value Ref Range   POC Glucose 108 (A) 70 - 99 mg/dl  POCT glycosylated hemoglobin (Hb A1C)  Result Value Ref Range   Hemoglobin A1C 6.1         Assessment & Plan:    1. Cough This is residual from his influenza syndrome.  2. Diabetes mellitus without complication Hemoglobin G9Q is at goal. - HM Diabetes Foot Exam - POCT glucose (manual entry) - POCT glycosylated hemoglobin (Hb A1C)  3. Viral illness He looks much better than  when I sent him to the hospital last week. I think he is slowly recovering from his flulike illness.   I personally performed the services described in this documentation, which was scribed in my presence. The recorded information has been reviewed and is accurate.  Arlyss Queen, MD  [facility], [place] I personally performed the services described in this documentation, which was scribed in my presence. The recorded information has been reviewed and is accurate. 04/27/2014 5:55 PM

## 2014-05-05 ENCOUNTER — Telehealth: Payer: Self-pay | Admitting: Emergency Medicine

## 2014-05-05 ENCOUNTER — Telehealth: Payer: Self-pay

## 2014-05-05 ENCOUNTER — Encounter: Payer: Self-pay | Admitting: *Deleted

## 2014-05-05 NOTE — Telephone Encounter (Signed)
Pt dropped off FMLA ppw on 05/03/14 for completion by Dr.Daub in 5-7 business days. Once Completed please return to disability box located by checkout at [address]. Blank copies have been scanned into chart under release #[id number] . Once returned [place] or myself will scan completed forms into Release #[id number], fufill release, contact patient, and fax to [phone].

## 2014-05-05 NOTE — Telephone Encounter (Signed)
Patient called and states that he needs a return to work without restrictions note faxed to his job today. Fax number is [phone]  [phone]

## 2014-05-05 NOTE — Telephone Encounter (Signed)
Dr Everlene Farrier- Pt ok to rtw without restrictions?

## 2014-05-06 NOTE — Telephone Encounter (Signed)
Patient came in to office yesterday to get updated work note. I faxed it for patient.

## 2014-05-08 ENCOUNTER — Emergency Department (HOSPITAL_COMMUNITY): Payer: 59

## 2014-05-08 ENCOUNTER — Emergency Department (HOSPITAL_COMMUNITY)
Admission: EM | Admit: 2014-05-08 | Discharge: 2014-05-09 | Disposition: A | Discharge status: Home / Self Care | Payer: 59 | Attending: Emergency Medicine | Admitting: Emergency Medicine

## 2014-05-08 ENCOUNTER — Encounter (HOSPITAL_COMMUNITY): Payer: Self-pay | Admitting: *Deleted

## 2014-05-08 DIAGNOSIS — Z79899 Other long term (current) drug therapy: Secondary | ICD-10-CM | POA: Diagnosis not present

## 2014-05-08 DIAGNOSIS — I10 Essential (primary) hypertension: Secondary | ICD-10-CM | POA: Diagnosis not present

## 2014-05-08 DIAGNOSIS — M79604 Pain in right leg: Secondary | ICD-10-CM | POA: Diagnosis not present

## 2014-05-08 DIAGNOSIS — M5442 Lumbago with sciatica, left side: Secondary | ICD-10-CM

## 2014-05-08 DIAGNOSIS — M5440 Lumbago with sciatica, unspecified side: Secondary | ICD-10-CM | POA: Insufficient documentation

## 2014-05-08 DIAGNOSIS — Z862 Personal history of diseases of the blood and blood-forming organs and certain disorders involving the immune mechanism: Secondary | ICD-10-CM | POA: Diagnosis not present

## 2014-05-08 DIAGNOSIS — M79605 Pain in left leg: Secondary | ICD-10-CM | POA: Diagnosis not present

## 2014-05-08 DIAGNOSIS — K219 Gastro-esophageal reflux disease without esophagitis: Secondary | ICD-10-CM | POA: Insufficient documentation

## 2014-05-08 DIAGNOSIS — Z872 Personal history of diseases of the skin and subcutaneous tissue: Secondary | ICD-10-CM | POA: Diagnosis not present

## 2014-05-08 DIAGNOSIS — M545 Low back pain: Secondary | ICD-10-CM | POA: Diagnosis present

## 2014-05-08 DIAGNOSIS — E785 Hyperlipidemia, unspecified: Secondary | ICD-10-CM | POA: Diagnosis not present

## 2014-05-08 DIAGNOSIS — E119 Type 2 diabetes mellitus without complications: Secondary | ICD-10-CM | POA: Insufficient documentation

## 2014-05-08 DIAGNOSIS — M5441 Lumbago with sciatica, right side: Secondary | ICD-10-CM

## 2014-05-08 LAB — CBC WITH DIFFERENTIAL/PLATELET
BASOS ABS: 0.1 10*3/uL (ref 0.0–0.1)
Basophils Relative: 1 % (ref 0–1)
EOS PCT: 2 % (ref 0–5)
Eosinophils Absolute: 0.2 10*3/uL (ref 0.0–0.7)
HCT: 43.3 % (ref 39.0–52.0)
HEMOGLOBIN: 15 g/dL (ref 13.0–17.0)
LYMPHS ABS: 2.3 10*3/uL (ref 0.7–4.0)
Lymphocytes Relative: 34 % (ref 12–46)
MCH: 29.4 pg (ref 26.0–34.0)
MCHC: 34.6 g/dL (ref 30.0–36.0)
MCV: 84.9 fL (ref 78.0–100.0)
MONOS PCT: 8 % (ref 3–12)
Monocytes Absolute: 0.5 10*3/uL (ref 0.1–1.0)
NEUTROS PCT: 55 % (ref 43–77)
Neutro Abs: 3.8 10*3/uL (ref 1.7–7.7)
Platelets: 300 10*3/uL (ref 150–400)
RBC: 5.1 MIL/uL (ref 4.22–5.81)
RDW: 12.7 % (ref 11.5–15.5)
WBC: 6.8 10*3/uL (ref 4.0–10.5)

## 2014-05-08 LAB — BASIC METABOLIC PANEL
Anion gap: 6 (ref 5–15)
BUN: 12 mg/dL (ref 6–20)
CALCIUM: 9.1 mg/dL (ref 8.9–10.3)
CO2: 26 mmol/L (ref 22–32)
CREATININE: 0.71 mg/dL (ref 0.61–1.24)
Chloride: 101 mmol/L (ref 101–111)
GFR calc non Af Amer: >60 mL/min (ref >60)
Glucose, Bld: 120 mg/dL — ABNORMAL HIGH (ref 70–99)
Potassium: 4.2 mmol/L (ref 3.5–5.1)
Sodium: 133 mmol/L — ABNORMAL LOW (ref 135–145)

## 2014-05-08 LAB — SEDIMENTATION RATE: Sed Rate: 14 mm/hr (ref 0–16)

## 2014-05-08 LAB — CK: Total CK: 104 U/L (ref 49–397)

## 2014-05-08 MED ORDER — SODIUM CHLORIDE 0.9 % IV BOLUS (SEPSIS): 500.0000 mL | Freq: Once | INTRAVENOUS | Status: DC

## 2014-05-08 MED ORDER — SODIUM CHLORIDE 0.9 % IV BOLUS (SEPSIS)
500.0000 mL | Freq: Once | INTRAVENOUS | Status: AC
  Administered 2014-05-08: 500 mL via INTRAVENOUS

## 2014-05-08 MED ORDER — ONDANSETRON HCL 4 MG/2ML IJ SOLN
INTRAMUSCULAR | Status: AC
  Filled 2014-05-08: qty 2

## 2014-05-08 MED ORDER — ONDANSETRON HCL 4 MG/2ML IJ SOLN
4.0000 mg | Freq: Once | INTRAMUSCULAR | Status: AC
  Administered 2014-05-08: 4 mg via INTRAVENOUS

## 2014-05-08 MED ORDER — HYDROMORPHONE HCL 1 MG/ML IJ SOLN
INTRAMUSCULAR | Status: AC
  Filled 2014-05-08: qty 1

## 2014-05-08 MED ORDER — HYDROMORPHONE HCL 1 MG/ML IJ SOLN
1.0000 mg | Freq: Once | INTRAMUSCULAR | Status: AC
  Administered 2014-05-08: 1 mg via INTRAVENOUS

## 2014-05-08 MED ORDER — MORPHINE SULFATE 4 MG/ML IJ SOLN
6.0000 mg | Freq: Once | INTRAMUSCULAR | Status: AC
  Administered 2014-05-08: 6 mg via INTRAVENOUS
  Filled 2014-05-08: qty 2

## 2014-05-08 NOTE — ED Notes (Signed)
Called MRI for timeline for pt scan, pt is next in line, will be picked up in 30-45 minutes

## 2014-05-08 NOTE — ED Provider Notes (Signed)
CSN: [id number]     Arrival date & time 05/08/14  1643 History   First MD Initiated Contact with Patient 05/08/14 2004     Chief Complaint  Patient presents with  . Back Pain     (Consider location/radiation/quality/duration/timing/severity/associated sxs/prior Treatment) HPI Comments: 54 year old male with history of diabetes, lipids, obesity, high blood pressure, spinal stenosis and lumbar surgery 2003 presents with severe bilateral leg pain different than previous for the past few days.this is different than any previous back or sciatica pain he has had. Patient intermittently has back pain and mild radiation down one leg he has never had pain like this. Patient is on a cholesterol medication pain is worse with walking. No vascular disease history. No fevers or chills or new injuries.no bowel or bladder incontinence, no focal weakness mostly pain related.  Patient is a 54 y.o. male presenting with back pain. The history is provided by the patient.  Back Pain Associated symptoms: no abdominal pain, no chest pain, no dysuria, no fever, no headaches, no numbness and no weakness     Past Medical History  Diagnosis Date  . Diabetes mellitus   . GERD (gastroesophageal reflux disease)   . Hyperlipidemia   . Anemia   . Hypertension   . Snores   . Sebaceous cyst 11/10/2012  . Environmental allergies 11/27/2011   Past Surgical History  Procedure Laterality Date  . Spine surgery  2004    lumb  . Appendectomy    . Mass excision Bilateral 11/24/2012    Procedure: EXCISION SCALP MASS X2;  Surgeon: [name], MD;  Location: [facility];  Service: General;  Laterality: Bilateral;   No family history on file. History  Substance Use Topics  . Smoking status: Never Smoker   . Smokeless tobacco: Never Used  . Alcohol Use: No    Review of Systems  Constitutional: Negative for fever and chills.  HENT: Negative for congestion.   Eyes: Negative for visual disturbance.   Respiratory: Negative for shortness of breath.   Cardiovascular: Negative for chest pain.  Gastrointestinal: Negative for vomiting and abdominal pain.  Genitourinary: Negative for dysuria and flank pain.  Musculoskeletal: Positive for back pain. Negative for neck pain and neck stiffness.  Skin: Negative for rash.  Neurological: Negative for weakness, light-headedness, numbness and headaches.      Allergies  Review of patient's allergies indicates no known allergies.  Home Medications   Prior to Admission medications   Medication Sig Start Date End Date Taking? Authorizing Provider  atorvastatin (LIPITOR) 20 MG tablet TAKE 1 TABLET BY MOUTH EVERY DAY Patient taking differently: Take 20 mg by mouth daily.  06/16/13  Yes [name], MD  losartan (COZAAR) 50 MG tablet Take 1 tablet (50 mg total) by mouth every morning. 01/26/14  Yes Bennett Scrape V, PA-C  metFORMIN (GLUCOPHAGE) 500 MG tablet Take 1 tablet (500 mg total) by mouth 2 (two) times daily with a meal. 01/26/14  Yes Bennett Scrape V, PA-C  ranitidine (ZANTAC) 300 MG tablet Take 1 tablet (300 mg total) by mouth at bedtime. 06/16/13  Yes [name], MD  Blood Glucose Monitoring Suppl (BLOOD GLUCOSE METER) kit Use as instructed 02/03/13   [name], MD  glucose blood test strip To check blood sugars once daily dx code 250.00 02/03/13   [name], MD  HYDROcodone-homatropine [facility]) 5-1.5 MG/5ML syrup Take 5 mLs by mouth every 6 (six) hours as needed for cough. 04/21/14   Orvil Feil  Patel-Mills, PA-C  Lancets MISC To check blood sugars once daily, dx code 250.00 02/03/13   [name], MD  meloxicam (MOBIC) 15 MG tablet Take once daily with food. Patient not taking: Reported on 05/08/2014 01/26/14   Ezekiel Slocumb, PA-C   BP 114/61 mmHg  Pulse 62  Temp(Src) 98.2 F (36.8 C) (Oral)  Resp 20  SpO2 96% Physical Exam  Constitutional: He is oriented to person, place, and time. He appears well-developed and well-nourished.  HENT:  Head:  Normocephalic and atraumatic.  Eyes: Right eye exhibits no discharge. Left eye exhibits no discharge.  Neck: Normal range of motion. Neck supple. No tracheal deviation present.  Cardiovascular: Normal rate and regular rhythm.   Pulmonary/Chest: Effort normal and breath sounds normal.  Abdominal: Soft. He exhibits no distension. There is no tenderness. There is no guarding.  Musculoskeletal: He exhibits tenderness. He exhibits no edema.  Mild paraspinal lower lumbar Mild tender to palpation of bilateral LE anterior and posterior, soft compartments, 2+ distal pulses LE  Neurological: He is alert and oriented to person, place, and time. GCS eye subscore is 4. GCS verbal subscore is 5. GCS motor subscore is 6.  Reflex Scores:      Patellar reflexes are 2+ on the right side and 2+ on the left side.      Achilles reflexes are 1+ on the right side and 1+ on the left side. 5+ strength in UE and LE with f/e at major joints. Sensation to palpation intact in UE and LE. CNs 2-12 grossly intact.  EOMFI.  PERRL.   Finger nose and coordination intact bilateral.   Visual fields intact to finger testing.   Skin: Skin is warm. No rash noted.  Psychiatric: He has a normal mood and affect.  Nursing note and vitals reviewed.   ED Course  Procedures (including critical care time) Labs Review Labs Reviewed  BASIC METABOLIC PANEL - Abnormal; Notable for the following:    Sodium 133 (*)    Glucose, Bld 120 (*)    All other components within normal limits  CBC WITH DIFFERENTIAL/PLATELET  CK  SEDIMENTATION RATE    Imaging Review No results found.   EKG Interpretation None      MDM   Final diagnoses:  Bilateral leg pain  Bilateral low back pain with sciatica, sciatica laterality unspecified   Patient presents with bilateral leg pain and mild low back pain. Patient has 2+ distal pulses in both lower extremities no classic blood clot risk factors. Clinical concern for muscular tenderness  secondary to cholesterol medication versus lumbar spine related. Blood work, CK MRI of lumbar spine ordered. Pain medicines ordered.  Patient's pain improved with pain medication. MRI lumbar pending. Patient's care be signed out to ED physician to follow-up MRI results and reassess with likely plan for close follow-up outpatient with primary Dr. if no acute findings on MRI. Discussed with patient to hold cholesterol medication until he sees primary doctor.  Filed Vitals:   05/08/14 2000 05/08/14 2045 05/08/14 2115 05/08/14 2215  BP: 134/61 124/69 126/62 114/61  Pulse: 58 58 55 62  Temp:      TempSrc:      Resp:      SpO2: 100% 100% 91% 96%      Elnora Morrison, MD 05/09/14 [phone]

## 2014-05-08 NOTE — ED Notes (Signed)
The pt is c/o lower back and bi-lateral leg pain for 3 days. He reports that he cannot walk.  He has chronic back pain and has had back surgery in the past

## 2014-05-08 NOTE — ED Notes (Addendum)
Pt denies any injury to either leg or back.  Pt w/a hx of chronic back pain with previous sx in 2003.  Pt sts he typically has pain in either leg, but never both, and never to this extreme.

## 2014-05-09 MED ORDER — OXYCODONE-ACETAMINOPHEN 5-325 MG PO TABS: 1.0000 | ORAL_TABLET | ORAL | Status: DC | PRN

## 2014-05-09 MED ORDER — OXYCODONE-ACETAMINOPHEN 5-325 MG PO TABS
1.0000 | ORAL_TABLET | Freq: Once | ORAL | Status: AC
  Administered 2014-05-09: 1 via ORAL
  Filled 2014-05-09: qty 1

## 2014-05-09 MED ORDER — SODIUM CHLORIDE 0.9 % IV BOLUS (SEPSIS)
500.0000 mL | Freq: Once | INTRAVENOUS | Status: AC
  Administered 2014-05-09: 500 mL via INTRAVENOUS

## 2014-05-09 NOTE — Discharge Instructions (Signed)
Stop taking atorvastatin (Lipitor). Take ocycodone-acetaminophen as needed for pain. Take ibuprofen or acetaminophen for less severe pain.  Don't take atorvastatin again unless and until your doctor says you should. He may choose to put you on a different cholesterol medication.  Acetaminophen; Oxycodone tablets What is this medicine? ACETAMINOPHEN; OXYCODONE (a set a MEE noe fen; ox i KOE done) is a pain reliever. It is used to treat mild to moderate pain. This medicine may be used for other purposes; ask your health care provider or pharmacist if you have questions. COMMON BRAND NAME(S): Endocet, Magnacet, Narvox, Percocet, Perloxx, Primalev, Primlev, Roxicet, Xolox What should I tell my health care provider before I take this medicine? They need to know if you have any of these conditions: -brain tumor -Crohn's disease, inflammatory bowel disease, or ulcerative colitis -drug abuse or addiction -head injury -heart or circulation problems -if you often drink alcohol -kidney disease or problems going to the bathroom -liver disease -lung disease, asthma, or breathing problems -an unusual or allergic reaction to acetaminophen, oxycodone, other opioid analgesics, other medicines, foods, dyes, or preservatives -pregnant or trying to get pregnant -breast-feeding How should I use this medicine? Take this medicine by mouth with a full glass of water. Follow the directions on the prescription label. Take your medicine at regular intervals. Do not take your medicine more often than directed. Talk to your pediatrician regarding the use of this medicine in children. Special care may be needed. Patients over 52 years old may have a stronger reaction and need a smaller dose. Overdosage: If you think you have taken too much of this medicine contact a poison control center or emergency room at once. NOTE: This medicine is only for you. Do not share this medicine with others. What if I miss a dose? If  you miss a dose, take it as soon as you can. If it is almost time for your next dose, take only that dose. Do not take double or extra doses. What may interact with this medicine? -alcohol -antihistamines -barbiturates like amobarbital, butalbital, butabarbital, methohexital, pentobarbital, phenobarbital, thiopental, and secobarbital -benztropine -drugs for bladder problems like solifenacin, trospium, oxybutynin, tolterodine, hyoscyamine, and methscopolamine -drugs for breathing problems like ipratropium and tiotropium -drugs for certain stomach or intestine problems like propantheline, homatropine methylbromide, glycopyrrolate, atropine, belladonna, and dicyclomine -general anesthetics like etomidate, ketamine, nitrous oxide, propofol, desflurane, enflurane, halothane, isoflurane, and sevoflurane -medicines for depression, anxiety, or psychotic disturbances -medicines for sleep -muscle relaxants -naltrexone -narcotic medicines (opiates) for pain -phenothiazines like perphenazine, thioridazine, chlorpromazine, mesoridazine, fluphenazine, prochlorperazine, promazine, and trifluoperazine -scopolamine -tramadol -trihexyphenidyl This list may not describe all possible interactions. Give your health care provider a list of all the medicines, herbs, non-prescription drugs, or dietary supplements you use. Also tell them if you smoke, drink alcohol, or use illegal drugs. Some items may interact with your medicine. What should I watch for while using this medicine? Tell your doctor or health care professional if your pain does not go away, if it gets worse, or if you have new or a different type of pain. You may develop tolerance to the medicine. Tolerance means that you will need a higher dose of the medication for pain relief. Tolerance is normal and is expected if you take this medicine for a long time. Do not suddenly stop taking your medicine because you may develop a severe reaction. Your body  becomes used to the medicine. This does NOT mean you are addicted. Addiction is a behavior related to getting  and using a drug for a non-medical reason. If you have pain, you have a medical reason to take pain medicine. Your doctor will tell you how much medicine to take. If your doctor wants you to stop the medicine, the dose will be slowly lowered over time to avoid any side effects. You may get drowsy or dizzy. Do not drive, use machinery, or do anything that needs mental alertness until you know how this medicine affects you. Do not stand or sit up quickly, especially if you are an older patient. This reduces the risk of dizzy or fainting spells. Alcohol may interfere with the effect of this medicine. Avoid alcoholic drinks. There are different types of narcotic medicines (opiates) for pain. If you take more than one type at the same time, you may have more side effects. Give your health care provider a list of all medicines you use. Your doctor will tell you how much medicine to take. Do not take more medicine than directed. Call emergency for help if you have problems breathing. The medicine will cause constipation. Try to have a bowel movement at least every 2 to 3 days. If you do not have a bowel movement for 3 days, call your doctor or health care professional. Do not take Tylenol (acetaminophen) or medicines that have acetaminophen with this medicine. Too much acetaminophen can be very dangerous. Many nonprescription medicines contain acetaminophen. Always read the labels carefully to avoid taking more acetaminophen. What side effects may I notice from receiving this medicine? Side effects that you should report to your doctor or health care professional as soon as possible: -allergic reactions like skin rash, itching or hives, swelling of the face, lips, or tongue -breathing difficulties, wheezing -confusion -light headedness or fainting spells -severe stomach pain -unusually weak or  tired -yellowing of the skin or the whites of the eyes Side effects that usually do not require medical attention (report to your doctor or health care professional if they continue or are bothersome): -dizziness -drowsiness -nausea -vomiting This list may not describe all possible side effects. Call your doctor for medical advice about side effects. You may report side effects to FDA at 1-800-FDA-1088. Where should I keep my medicine? Keep out of the reach of children. This medicine can be abused. Keep your medicine in a safe place to protect it from theft. Do not share this medicine with anyone. Selling or giving away this medicine is dangerous and against the law. Store at room temperature between 20 and 25 degrees C (68 and 77 degrees F). Keep container tightly closed. Protect from light. This medicine may cause accidental overdose and death if it is taken by other adults, children, or pets. Flush any unused medicine down the toilet to reduce the chance of harm. Do not use the medicine after the expiration date. NOTE: This sheet is a summary. It may not cover all possible information. If you have questions about this medicine, talk to your doctor, pharmacist, or health care provider.  2015, [employer]. (2012-08-11 13:17:35)

## 2014-05-09 NOTE — ED Provider Notes (Signed)
Patient initially seen and evaluated by Drs. Zavitz, evaluated for severe lower back and leg pain. He was sent for MRI scan which showed no acute process to explain his pain. It is noted that he is on atorvastatin, but has normal CK. It is still possible that he is having muscle pain related to the atorvastatin. He is advised to discontinue that and is given a prescription for oxycodone-acetaminophen to take for pain. Follow-up with PCP in 2-3 days.  Results for orders placed or performed during the hospital encounter of 16/10/96  Basic metabolic panel  Result Value Ref Range   Sodium 133 (L) 135 - 145 mmol/L   Potassium 4.2 3.5 - 5.1 mmol/L   Chloride 101 101 - 111 mmol/L   CO2 26 22 - 32 mmol/L   Glucose, Bld 120 (H) 70 - 99 mg/dL   BUN 12 6 - 20 mg/dL   Creatinine, Ser 0.71 0.61 - 1.24 mg/dL   Calcium 9.1 8.9 - 10.3 mg/dL   GFR calc non Af Amer >60 >60 mL/min   GFR calc Af Amer >60 >60 mL/min   Anion gap 6 5 - 15  CBC with Differential/Platelet  Result Value Ref Range   WBC 6.8 4.0 - 10.5 K/uL   RBC 5.10 4.22 - 5.81 MIL/uL   Hemoglobin 15.0 13.0 - 17.0 g/dL   HCT 43.3 39.0 - 52.0 %   MCV 84.9 78.0 - 100.0 fL   MCH 29.4 26.0 - 34.0 pg   MCHC 34.6 30.0 - 36.0 g/dL   RDW 12.7 11.5 - 15.5 %   Platelets 300 150 - 400 K/uL   Neutrophils Relative % 55 43 - 77 %   Neutro Abs 3.8 1.7 - 7.7 K/uL   Lymphocytes Relative 34 12 - 46 %   Lymphs Abs 2.3 0.7 - 4.0 K/uL   Monocytes Relative 8 3 - 12 %   Monocytes Absolute 0.5 0.1 - 1.0 K/uL   Eosinophils Relative 2 0 - 5 %   Eosinophils Absolute 0.2 0.0 - 0.7 K/uL   Basophils Relative 1 0 - 1 %   Basophils Absolute 0.1 0.0 - 0.1 K/uL  CK  Result Value Ref Range   Total CK 104 49 - 397 U/L  Sedimentation rate  Result Value Ref Range   Sed Rate 14 0 - 16 mm/hr   Dg Chest 2 View  04/21/2014   CLINICAL DATA:  Cough and nausea.  Abdominal pain.  EXAM: CHEST  2 VIEW 1:11 p.m.  COMPARISON:  04/21/2014 8:14 a.m.  FINDINGS: The heart size and  mediastinal contours are within normal limits. Both lungs are clear. The visualized skeletal structures are unremarkable.  IMPRESSION: Normal exam.   Electronically Signed   By: Lorriane Shire M.D.   On: 04/21/2014 13:42   Dg Chest 2 View  04/21/2014   CLINICAL DATA:  Cough.  Wheezing.  Fatigue.  EXAM: CHEST  2 VIEW  COMPARISON:  None.  FINDINGS: The heart size and mediastinal contours are within normal limits. Both lungs are clear. The visualized skeletal structures are unremarkable.  IMPRESSION: Normal exam.   Electronically Signed   By: Lorriane Shire M.D.   On: 04/21/2014 09:51   Mr Thoracic Spine Wo Contrast  05/09/2014   EXAM: MRI THORACIC AND LUMBAR SPINE WITHOUT CONTRAST  TECHNIQUE: Multiplanar and multiecho pulse sequences of the thoracic and lumbar spine were obtained without intravenous contrast.  COMPARISON:  None.  FINDINGS: MR THORACIC SPINE FINDINGS  The vertebral bodies are  normally aligned with preservation of the normal thoracic kyphosis. Vertebral body heights are maintained. No fracture or listhesis. No bone marrow edema. No focal osseous lesion.  Signal intensity within the thoracic spinal cord is normal.  Paraspinous soft tissues are within normal limits. Visualized lungs are clear.  Scattered multilevel chronic degenerative endplate Schmorl's nodes seen throughout the thoracic spine. Mild degenerative disc bulge present at T2-3. No other significant degenerative disc bulging. Degenerative disc desiccation present within the mid thoracic spine. No significant canal or foraminal stenosis.  MR LUMBAR SPINE FINDINGS  Vertebral bodies are normally aligned with preservation of the normal lumbar lordosis. Scattered chronic small degenerative endplate Schmorl's nodes present within knee upper lumbar spine. Vertebral body heights maintained. No fracture or listhesis. Signal intensity within the vertebral body bone marrow is normal. No bone marrow edema. No focal osseous lesion.  Conus medullaris  terminates normally at the L1 level. Signal intensity within the visualized cord is normal. Nerve roots of the cauda equina are within normal limits.  Paraspinous soft tissues are unremarkable.  L1-2:  Negative.  L2-3:  Negative.  L3-4: Mild diffuse degenerative disc bulge with disc desiccation. No focal disc herniation. Mild facet and ligamentous hypertrophy. There is resultant mild canal and bilateral foraminal narrowing.  L4-5: Diffuse disc bulge with disc desiccation and intervertebral disc space narrowing. Superimposed shallow right foraminal disc protrusion, closely approximating the exiting right L4 nerve root as it courses out of the right neural foramen. Moderate facet hypertrophy. There is mild canal narrowing. Mild bilateral foraminal narrowing present as well, slightly worse on the right.  L5-S1: Mild diffuse disc bulge with disc desiccation. No focal disc herniation. There is moderate facet hypertrophy bilaterally. No significant canal stenosis. Moderate to severe left with mild right foraminal narrowing.  IMPRESSION: MR THORACIC SPINE IMPRESSION  1. No acute abnormality within the thoracic spine. 2. Mild multilevel degenerative disc disease without significant canal or foraminal stenosis.  MR LUMBAR SPINE IMPRESSION  1. No acute abnormality within the lumbar spine. 2. Shallow right foraminal disc protrusion at L4-5, closely approximating the exiting right L4 nervea root as it courses out of the right neural foramen. This could potentially result in right lower extremity radicular symptoms. 3. Additional mild multilevel degenerative disc disease and facet arthropathy as above. No significant canal stenosis identified.   Electronically Signed   By: Jeannine Boga M.D.   On: 05/09/2014 01:21   Mr Lumbar Spine Wo Contrast  05/09/2014   EXAM: MRI THORACIC AND LUMBAR SPINE WITHOUT CONTRAST  TECHNIQUE: Multiplanar and multiecho pulse sequences of the thoracic and lumbar spine were obtained without  intravenous contrast.  COMPARISON:  None.  FINDINGS: MR THORACIC SPINE FINDINGS  The vertebral bodies are normally aligned with preservation of the normal thoracic kyphosis. Vertebral body heights are maintained. No fracture or listhesis. No bone marrow edema. No focal osseous lesion.  Signal intensity within the thoracic spinal cord is normal.  Paraspinous soft tissues are within normal limits. Visualized lungs are clear.  Scattered multilevel chronic degenerative endplate Schmorl's nodes seen throughout the thoracic spine. Mild degenerative disc bulge present at T2-3. No other significant degenerative disc bulging. Degenerative disc desiccation present within the mid thoracic spine. No significant canal or foraminal stenosis.  MR LUMBAR SPINE FINDINGS  Vertebral bodies are normally aligned with preservation of the normal lumbar lordosis. Scattered chronic small degenerative endplate Schmorl's nodes present within knee upper lumbar spine. Vertebral body heights maintained. No fracture or listhesis. Signal intensity within the vertebral body  bone marrow is normal. No bone marrow edema. No focal osseous lesion.  Conus medullaris terminates normally at the L1 level. Signal intensity within the visualized cord is normal. Nerve roots of the cauda equina are within normal limits.  Paraspinous soft tissues are unremarkable.  L1-2:  Negative.  L2-3:  Negative.  L3-4: Mild diffuse degenerative disc bulge with disc desiccation. No focal disc herniation. Mild facet and ligamentous hypertrophy. There is resultant mild canal and bilateral foraminal narrowing.  L4-5: Diffuse disc bulge with disc desiccation and intervertebral disc space narrowing. Superimposed shallow right foraminal disc protrusion, closely approximating the exiting right L4 nerve root as it courses out of the right neural foramen. Moderate facet hypertrophy. There is mild canal narrowing. Mild bilateral foraminal narrowing present as well, slightly worse on  the right.  L5-S1: Mild diffuse disc bulge with disc desiccation. No focal disc herniation. There is moderate facet hypertrophy bilaterally. No significant canal stenosis. Moderate to severe left with mild right foraminal narrowing.  IMPRESSION: MR THORACIC SPINE IMPRESSION  1. No acute abnormality within the thoracic spine. 2. Mild multilevel degenerative disc disease without significant canal or foraminal stenosis.  MR LUMBAR SPINE IMPRESSION  1. No acute abnormality within the lumbar spine. 2. Shallow right foraminal disc protrusion at L4-5, closely approximating the exiting right L4 nervea root as it courses out of the right neural foramen. This could potentially result in right lower extremity radicular symptoms. 3. Additional mild multilevel degenerative disc disease and facet arthropathy as above. No significant canal stenosis identified.   Electronically Signed   By: Jeannine Boga M.D.   On: 05/09/2014 01:21   Images viewed by me.   Delora Fuel, MD 66/44/03 [id number]

## 2014-05-11 ENCOUNTER — Telehealth: Payer: Self-pay

## 2014-05-11 NOTE — Telephone Encounter (Signed)
SPoke with pt, advised pt to come in and see Dr. Everlene Farrier to discuss ED visit. He will come in on Friday to see Dr. Everlene Farrier.

## 2014-05-11 NOTE — Telephone Encounter (Signed)
The patient came into office at 104 to state that he was seen in the hospital ER on 05/09/14 and wants to know if he needs to follow up with Dr. Everlene Farrier regarding this.  The patient stated he was taken off of Lipitor per request of ER physician since he was seen for leg pain/sciatica and would like to discuss with Dr. Everlene Farrier.  The patient may be reached at [phone].

## 2014-05-11 NOTE — Telephone Encounter (Signed)
The patient came in to 104 to report that [employer] has not received the paperwork back from Dr. Everlene Farrier regarding Disability/FMLA paperwork.  The patient related that the claim will not be paid until this paperwork is received.  The patient stated that [employer] told him they re-faxed the information on Friday 05/07/14 directly to our office.  The patient may be reached at [phone].  The patient stated that we have the fax # to send the information back to [place].

## 2014-05-12 NOTE — Telephone Encounter (Signed)
FMLA do you have this paperwork?

## 2014-05-12 NOTE — Telephone Encounter (Signed)
Patient's FMLA forms were completed and scanned to his chart on 05/06/2014. As far as his disability request from [facility], if he needs his records/OV notes sent to [facility] we may be waiting for payment from them before we can send this information. His request is on hold under release ID # [id number]. Me or Ander Purpura will look into this when we have time to.

## 2014-05-14 ENCOUNTER — Ambulatory Visit (INDEPENDENT_AMBULATORY_CARE_PROVIDER_SITE_OTHER): Payer: 59 | Admitting: Emergency Medicine

## 2014-05-14 ENCOUNTER — Encounter (HOSPITAL_COMMUNITY): Payer: 59

## 2014-05-14 ENCOUNTER — Ambulatory Visit (HOSPITAL_COMMUNITY): Payer: 59 | Attending: Internal Medicine

## 2014-05-14 VITALS — BP 116/70 | HR 74 | Temp 98.1°F | Resp 20 | Ht 64.5 in | Wt 257.0 lb

## 2014-05-14 DIAGNOSIS — M545 Low back pain: Secondary | ICD-10-CM

## 2014-05-14 DIAGNOSIS — M79662 Pain in left lower leg: Secondary | ICD-10-CM

## 2014-05-14 DIAGNOSIS — M79605 Pain in left leg: Secondary | ICD-10-CM | POA: Insufficient documentation

## 2014-05-14 DIAGNOSIS — M79604 Pain in right leg: Secondary | ICD-10-CM | POA: Insufficient documentation

## 2014-05-14 DIAGNOSIS — I10 Essential (primary) hypertension: Secondary | ICD-10-CM | POA: Diagnosis not present

## 2014-05-14 DIAGNOSIS — M79661 Pain in right lower leg: Secondary | ICD-10-CM

## 2014-05-14 DIAGNOSIS — Z0271 Encounter for disability determination: Secondary | ICD-10-CM

## 2014-05-14 DIAGNOSIS — E785 Hyperlipidemia, unspecified: Secondary | ICD-10-CM | POA: Insufficient documentation

## 2014-05-14 DIAGNOSIS — E119 Type 2 diabetes mellitus without complications: Secondary | ICD-10-CM | POA: Insufficient documentation

## 2014-05-14 MED ORDER — OXYCODONE-ACETAMINOPHEN 5-325 MG PO TABS: 1.0000 | ORAL_TABLET | ORAL | Status: DC | PRN

## 2014-05-14 NOTE — Progress Notes (Signed)
   Subjective:    Patient ID: Matthew Garner, male    DOB: 1960-02-05, 54 y.o.   MRN: [id number]  HPIthis visit scribed by Matthew Garner in room 1 in presence of Dr. Elpidio Garner today for follow up on MRI thoracic and lumbar spine. He states he is having bilateral leg pain. Pain scale 10/10. The pain hurts so bad he cant hardly walk. Pain is worse from knees down. The pain started when he went back to work after being out for 2 weeks due to flu. Denies any incontinence or losing control of bowels. Denies any weakness in legs. Patient has been to the orthopedist before. He has seen Matthew Garner. He is seen Matthew Garner. He received an opinion from Matthew Garner neurosurgeon and he chose not to have surgery on his back.   Review of Systems patient has ongoing diabetes hyperlipidemia and hypertension.      Objective:   Physical Exam  Musculoskeletal:  No no tenderness in the flank area or over the lower lumbar spine  Neurological:  Straight leg raising is negative of both legs. Vibratory and position sense are maintained in the lower extremities. Deep tendon reflexes right knee right ankle are 1+ absent left knee 1+ left ankle. Plantar and dorsiflexion of the left foot does elicit leg pain. The calves are tender to touch.           Assessment & Plan:  We'll leave the patient out of work the next week. His pain medications were refilled. Referral made back to Matthew Garner for his evaluation. He had a CT of the head and neck in 2013. He has abnormal areas on his MRI of the thoracic and lumbar spine. Thoracic spine MRI shows mild multilevel degenerative disease. MRI the lumbar spine shows a protrusion L4-5 on the right possibly affecting the right L4 nerve root. There is additional multilevel degenerative disc disease. There is no significant spinal stenosis. He is going to have Dopplers of his legs at 2:00. He was given a note for next week. I will recheck next Friday. Matthew Jordan MD's note was extended to  be off 2 weeks. This will give him time to recover. I did recur receive a call back from the vascular lab. There is a large right popliteal cyst or evidence of DVT arterial flow appeared normal.

## 2014-05-14 NOTE — Patient Instructions (Signed)
You have a doppler scheduled at 2:00pm. Please arrive at 1:30 for check in.  [facility]  No reviews  Medical Clinic  [place]  [phone]

## 2014-05-21 ENCOUNTER — Ambulatory Visit (INDEPENDENT_AMBULATORY_CARE_PROVIDER_SITE_OTHER): Payer: 59 | Admitting: Internal Medicine

## 2014-05-21 ENCOUNTER — Ambulatory Visit (INDEPENDENT_AMBULATORY_CARE_PROVIDER_SITE_OTHER): Payer: 59

## 2014-05-21 VITALS — BP 112/64 | HR 84 | Temp 98.9°F | Resp 16 | Ht 64.5 in | Wt 257.0 lb

## 2014-05-21 DIAGNOSIS — M25561 Pain in right knee: Secondary | ICD-10-CM

## 2014-05-21 DIAGNOSIS — M7121 Synovial cyst of popliteal space [Baker], right knee: Secondary | ICD-10-CM

## 2014-05-21 MED ORDER — NAPROXEN 500 MG PO TABS: 500.0000 mg | ORAL_TABLET | Freq: Two times a day (BID) | ORAL | Status: DC

## 2014-05-21 NOTE — Patient Instructions (Signed)
Baker Cyst °A Baker cyst is a sac-like structure that forms in the back of the knee. It is filled with the same fluid that is located in your knee. This fluid lubricates the bones and cartilage of the knee and allows them to move over each other more easily. °CAUSES  °When the knee becomes injured or inflamed, increased fluid forms in the knee. When this happens, the joint lining is pushed out behind the knee and forms the Baker cyst. This cyst may also be caused by inflammation from arthritic conditions and infections. °SIGNS AND SYMPTOMS  °A Baker cyst usually has no symptoms. When the cyst is substantially enlarged: °· You may feel pressure behind the knee, stiffness in the knee, or a mass in the area behind the knee. °· You may develop pain, redness, and swelling in the calf.  This can suggest a blood clot and requires evaluation by your health care provider. °DIAGNOSIS  °A Baker cyst is most often found during an ultrasound exam. This exam may have been performed for other reasons, and the cyst was found incidentally. Sometimes an MRI is used. This picks up other problems within a joint that an ultrasound exam may not. If the Baker cyst developed immediately after an injury, X-ray exams may be used to diagnose the cyst. °TREATMENT  °The treatment depends on the cause of the cyst. Anti-inflammatory medicines and rest often will be prescribed. If the cyst is caused by a bacterial infection, antibiotic medicines may be prescribed.  °HOME CARE INSTRUCTIONS  °· If the cyst was caused by an injury, for the first 24 hours, keep the injured leg elevated on 2 pillows while lying down. °· For the first 24 hours while you are awake, apply ice to the injured area: °¨ Put ice in a plastic bag. °¨ Place a towel between your skin and the bag. °¨ Leave the ice on for 20 minutes, 2-3 times a day. °· Only take over-the-counter or prescription medicines for pain, discomfort, or fever as directed by your health care  provider. °· Only take antibiotic medicine as directed. Make sure to finish it even if you start to feel better. °MAKE SURE YOU:  °· Understand these instructions. °· Will watch your condition. °· Will get help right away if you are not doing well or get worse. °Document Released: 12/18/2004 Document Revised: 10/08/2012 Document Reviewed: 07/30/2012 °ExitCare® Patient Information ©2015 [employer]. This information is not intended to replace advice given to you by your health care provider. Make sure you discuss any questions you have with your health care provider. ° °

## 2014-05-21 NOTE — Progress Notes (Signed)
   Subjective:    Patient ID: Matthew Garner, male    DOB: Apr 03, 1960, 54 y.o.   MRN: [id number]  HPI See leg pain evals, has mass, medial right popliteal fossae. Doppler revealed large bakers cyst.Tender and tense. No red, or warm. Is painful to walk   Review of Systems     Objective:   Physical Exam  Constitutional: He is oriented to person, place, and time. He appears well-developed and well-nourished. He appears distressed.  HENT:  Head: Normocephalic.  Right Ear: External ear normal.  Left Ear: External ear normal.  Nose: Nose normal.  Mouth/Throat: Oropharynx is clear and moist.  Eyes: Conjunctivae and EOM are normal. Pupils are equal, round, and reactive to light.  Neck: Normal range of motion. Neck supple.  Cardiovascular: Normal rate.   Pulmonary/Chest: Effort normal and breath sounds normal.  Abdominal: Soft.  Musculoskeletal: He exhibits edema and tenderness.       Right knee: He exhibits decreased range of motion, swelling and abnormal meniscus. He exhibits no effusion, no ecchymosis, no deformity, no erythema, normal alignment and no bony tenderness. Tenderness found.       Legs: Smooth, tender, tense cystic mass medial posterior knee  Neurological: He is alert and oriented to person, place, and time. He has normal reflexes. He exhibits normal muscle tone. Coordination normal.  Skin: No rash noted. No erythema.  Psychiatric: He has a normal mood and affect.  Vitals reviewed.    [facility] reading (PRIMARY) by  Dr.Guest.moderate degenerative joint disease., STS posterior.       Assessment & Plan:  Pain knee/Bkers cyst/DJD Naprosyn 500mg  prn Refer to his orthopedist

## 2014-08-03 ENCOUNTER — Encounter: Payer: Self-pay | Admitting: Emergency Medicine

## 2014-08-03 ENCOUNTER — Ambulatory Visit (INDEPENDENT_AMBULATORY_CARE_PROVIDER_SITE_OTHER): Payer: 59 | Admitting: Emergency Medicine

## 2014-08-03 ENCOUNTER — Other Ambulatory Visit: Payer: Self-pay | Admitting: Emergency Medicine

## 2014-08-03 VITALS — BP 120/79 | HR 57 | Temp 97.2°F | Resp 16 | Wt 258.4 lb

## 2014-08-03 DIAGNOSIS — E785 Hyperlipidemia, unspecified: Secondary | ICD-10-CM | POA: Diagnosis not present

## 2014-08-03 DIAGNOSIS — E119 Type 2 diabetes mellitus without complications: Secondary | ICD-10-CM

## 2014-08-03 DIAGNOSIS — Z113 Encounter for screening for infections with a predominantly sexual mode of transmission: Secondary | ICD-10-CM

## 2014-08-03 DIAGNOSIS — I10 Essential (primary) hypertension: Secondary | ICD-10-CM

## 2014-08-03 LAB — CBC WITH DIFFERENTIAL/PLATELET
BASOS ABS: 0.1 10*3/uL (ref 0.0–0.1)
Basophils Relative: 1 % (ref 0–1)
Eosinophils Absolute: 0.1 10*3/uL (ref 0.0–0.7)
Eosinophils Relative: 1 % (ref 0–5)
HCT: 45.2 % (ref 39.0–52.0)
Hemoglobin: 15.2 g/dL (ref 13.0–17.0)
Lymphocytes Relative: 24 % (ref 12–46)
Lymphs Abs: 2 10*3/uL (ref 0.7–4.0)
MCH: 28.8 pg (ref 26.0–34.0)
MCHC: 33.6 g/dL (ref 30.0–36.0)
MCV: 85.8 fL (ref 78.0–100.0)
MPV: 9.8 fL (ref 8.6–12.4)
Monocytes Absolute: 0.7 10*3/uL (ref 0.1–1.0)
Monocytes Relative: 8 % (ref 3–12)
Neutro Abs: 5.5 10*3/uL (ref 1.7–7.7)
Neutrophils Relative %: 66 % (ref 43–77)
PLATELETS: 328 10*3/uL (ref 150–400)
RBC: 5.27 MIL/uL (ref 4.22–5.81)
RDW: 13.9 % (ref 11.5–15.5)
WBC: 8.4 10*3/uL (ref 4.0–10.5)

## 2014-08-03 LAB — POCT GLYCOSYLATED HEMOGLOBIN (HGB A1C): HEMOGLOBIN A1C: 6.3

## 2014-08-03 LAB — COMPLETE METABOLIC PANEL WITH GFR
ALT: 21 U/L (ref 9–46)
AST: 15 U/L (ref 10–35)
Albumin: 4.1 g/dL (ref 3.6–5.1)
Alkaline Phosphatase: 77 U/L (ref 40–115)
BUN: 14 mg/dL (ref 7–25)
CO2: 27 mmol/L (ref 20–31)
Calcium: 9.5 mg/dL (ref 8.6–10.3)
Chloride: 98 mmol/L (ref 98–110)
Creat: 0.72 mg/dL (ref 0.70–1.33)
GFR, Est African American: >89 mL/min (ref >=60)
GFR, Est Non African American: >89 mL/min (ref >=60)
Glucose, Bld: 111 mg/dL — ABNORMAL HIGH (ref 65–99)
Potassium: 4.2 mmol/L (ref 3.5–5.3)
Sodium: 140 mmol/L (ref 135–146)
Total Bilirubin: 0.4 mg/dL (ref 0.2–1.2)
Total Protein: 7.3 g/dL (ref 6.1–8.1)

## 2014-08-03 LAB — LIPID PANEL
Cholesterol: 205 mg/dL — ABNORMAL HIGH (ref 125–200)
HDL: 68 mg/dL (ref >=40)
LDL Cholesterol: 123 mg/dL (ref <130)
Total CHOL/HDL Ratio: 3 Ratio (ref <=5.0)
Triglycerides: 69 mg/dL (ref <150)
VLDL: 14 mg/dL (ref <30)

## 2014-08-03 LAB — HIV ANTIBODY (ROUTINE TESTING W REFLEX): HIV 1&2 Ab, 4th Generation: NONREACTIVE

## 2014-08-03 LAB — GLUCOSE, POCT (MANUAL RESULT ENTRY): POC Glucose: 122 mg/dl — AB (ref 70–99)

## 2014-08-03 MED ORDER — HYDROCORTISONE 1 % EX CREA: 1.0000 "application " | TOPICAL_CREAM | Freq: Two times a day (BID) | CUTANEOUS | Status: DC

## 2014-08-03 NOTE — Progress Notes (Signed)
This chart was scribed for Arlyss Queen, MD by Marti Sleigh, Medical Scribe. This patient was seen in Room 21 and the patient's care was started at 3:40 PM.  Chief Complaint:  Chief Complaint  Patient presents with  . Diabetes    HPI: Matthew Garner is a 54 y.o. male who reports to [facility] today reporting for a DM follow up. Pt is complaining of right knee pain. He had a surgery on the 5th of July on his right knee. Pt had fluid taken off his knee yesterday by his orthopedist. Pt's orthopedist is Dr. Theda Sers.  Pt is also complaining of pain in his anus, which started after he strained to have a BM. Pt states he has something protruding out of his anus, which might be a hemorrhoid. He says he has had this in the past. He has difficulty sitting down due to discomfort.   Pt declined pneumonia vaccine. Pt denies colonoscopy.    Past Medical History  Diagnosis Date  . Diabetes mellitus   . GERD (gastroesophageal reflux disease)   . Hyperlipidemia   . Anemia   . Hypertension   . Snores   . Sebaceous cyst 11/10/2012  . Environmental allergies 11/27/2011   Past Surgical History  Procedure Laterality Date  . Spine surgery  2004    lumb  . Appendectomy    . Mass excision Bilateral 11/24/2012    Procedure: EXCISION SCALP MASS X2;  Surgeon: [name], MD;  Location: [facility];  Service: General;  Laterality: Bilateral;   History   Social History  . Marital Status: Married    Spouse Name: N/A  . Number of Children: N/A  . Years of Education: N/A   Social History Main Topics  . Smoking status: Never Smoker   . Smokeless tobacco: Never Used  . Alcohol Use: No  . Drug Use: No  . Sexual Activity: Not on file   Other Topics Concern  . None   Social History Narrative   No family history on file. Not on File Prior to Admission medications   Medication Sig Start Date End Date Taking? Authorizing Provider  Blood Glucose Monitoring Suppl (BLOOD GLUCOSE METER)  kit Use as instructed 02/03/13   [name], MD  glucose blood test strip To check blood sugars once daily dx code 250.00 02/03/13   [name], MD  Lancets MISC To check blood sugars once daily, dx code 250.00 02/03/13   [name], MD  losartan (COZAAR) 50 MG tablet Take 1 tablet (50 mg total) by mouth every morning. 01/26/14   Ezekiel Slocumb, PA-C  metFORMIN (GLUCOPHAGE) 500 MG tablet Take 1 tablet (500 mg total) by mouth 2 (two) times daily with a meal. 01/26/14   Ezekiel Slocumb, PA-C  naproxen (NAPROSYN) 500 MG tablet Take 1 tablet (500 mg total) by mouth 2 (two) times daily with a meal. 05/21/14   Orma Flaming, MD  oxyCODONE-acetaminophen (PERCOCET) 5-325 MG per tablet Take 1 tablet by mouth every 4 (four) hours as needed for moderate pain. 05/14/14   [name], MD  ranitidine (ZANTAC) 300 MG tablet Take 1 tablet (300 mg total) by mouth at bedtime. 06/16/13   [name], MD     ROS: The patient denies fevers, chills, night sweats, unintentional weight loss, chest pain, palpitations, wheezing, dyspnea on exertion, nausea, vomiting, abdominal pain, dysuria, hematuria, melena, numbness, weakness, or tingling.   All other systems have been reviewed and  were otherwise negative with the exception of those mentioned in the HPI and as above.    PHYSICAL EXAM: Filed Vitals:   08/03/14 1538  BP: 120/79  Pulse: 57  Temp: 97.2 F (36.2 C)  Resp: 16   Body mass index is 43.69 kg/(m^2).   General: Alert, no acute distress HEENT:  Normocephalic, atraumatic, oropharynx patent. Eye: Juliette Mangle [facility] Cardiovascular:  Regular rate and rhythm, no rubs murmurs or gallops.  No Carotid bruits, radial pulse intact. No pedal edema.  Respiratory: Clear to auscultation bilaterally.  No wheezes, rales, or rhonchi.  No cyanosis, no use of accessory musculature Abdominal: No organomegaly, abdomen is soft and non-tender, positive bowel sounds.  No masses. Musculoskeletal: Several surgical sites on right  knee which do not appear infected. Some pain with flexion and extension.  Skin: No rashes. Neurologic: Facial musculature symmetric. Psychiatric: Patient acts appropriately throughout our interaction. Lymphatic: No cervical or submandibular lymphadenopathy Genitourinary/Anorectal: There is a 2/2cm fluctuant external hemorrhoid noted at 9 o' clock. There is no bleeding.   LABS: Results for orders placed or performed during the hospital encounter of 63/14/97  Basic metabolic panel  Result Value Ref Range   Sodium 133 (L) 135 - 145 mmol/L   Potassium 4.2 3.5 - 5.1 mmol/L   Chloride 101 101 - 111 mmol/L   CO2 26 22 - 32 mmol/L   Glucose, Bld 120 (H) 70 - 99 mg/dL   BUN 12 6 - 20 mg/dL   Creatinine, Ser 0.71 0.61 - 1.24 mg/dL   Calcium 9.1 8.9 - 10.3 mg/dL   GFR calc non Af Amer >60 >60 mL/min   GFR calc Af Amer >60 >60 mL/min   Anion gap 6 5 - 15  CBC with Differential/Platelet  Result Value Ref Range   WBC 6.8 4.0 - 10.5 K/uL   RBC 5.10 4.22 - 5.81 MIL/uL   Hemoglobin 15.0 13.0 - 17.0 g/dL   HCT 43.3 39.0 - 52.0 %   MCV 84.9 78.0 - 100.0 fL   MCH 29.4 26.0 - 34.0 pg   MCHC 34.6 30.0 - 36.0 g/dL   RDW 12.7 11.5 - 15.5 %   Platelets 300 150 - 400 K/uL   Neutrophils Relative % 55 43 - 77 %   Neutro Abs 3.8 1.7 - 7.7 K/uL   Lymphocytes Relative 34 12 - 46 %   Lymphs Abs 2.3 0.7 - 4.0 K/uL   Monocytes Relative 8 3 - 12 %   Monocytes Absolute 0.5 0.1 - 1.0 K/uL   Eosinophils Relative 2 0 - 5 %   Eosinophils Absolute 0.2 0.0 - 0.7 K/uL   Basophils Relative 1 0 - 1 %   Basophils Absolute 0.1 0.0 - 0.1 K/uL  CK  Result Value Ref Range   Total CK 104 49 - 397 U/L  Sedimentation rate  Result Value Ref Range   Sed Rate 14 0 - 16 mm/hr   Results for orders placed or performed in visit on 08/03/14  POCT glucose (manual entry)  Result Value Ref Range   POC Glucose 122 (A) 70 - 99 mg/dl  POCT glycosylated hemoglobin (Hb A1C)  Result Value Ref Range   Hemoglobin A1C 6.3       EKG/XRAY:   Primary read interpreted by Dr. Everlene Farrier at [facility].   ASSESSMENT/PLAN: Hemoglobin A1c is at goal. Glucose was 122. Blood pressure is at goal. He does have a large hemorrhoid and was prescribed Proctocort for that. He refused to have any vaccinations  today and refused referral for colonoscopy.I personally performed the services described in this documentation, which was scribed in my presence. The recorded information has been reviewed and is accurate.  Nena Jordan, MD   Gross sideeffects, risk and benefits, and alternatives of medications d/w patient. Patient is aware that all medications have potential sideeffects and we are unable to predict every sideeffect or drug-drug interaction that may occur.  Arlyss Queen MD 08/03/2014 3:40 PM

## 2014-08-04 ENCOUNTER — Encounter: Payer: Self-pay | Admitting: Family Medicine

## 2014-08-04 LAB — MICROALBUMIN, URINE: Microalb, Ur: 2.5 mg/dL — ABNORMAL HIGH (ref <2.0)

## 2014-09-20 ENCOUNTER — Other Ambulatory Visit: Payer: Self-pay | Admitting: Ophthalmology

## 2014-09-20 DIAGNOSIS — M7121 Synovial cyst of popliteal space [Baker], right knee: Secondary | ICD-10-CM

## 2014-09-22 ENCOUNTER — Ambulatory Visit
Admission: RE | Admit: 2014-09-22 | Discharge: 2014-09-22 | Disposition: A | Discharge status: Home / Self Care | Payer: 59 | Source: Ambulatory Visit | Attending: Ophthalmology | Admitting: Ophthalmology

## 2014-09-22 DIAGNOSIS — M7121 Synovial cyst of popliteal space [Baker], right knee: Secondary | ICD-10-CM

## 2014-10-05 ENCOUNTER — Encounter: Payer: Self-pay | Admitting: Emergency Medicine

## 2014-11-07 ENCOUNTER — Encounter (HOSPITAL_COMMUNITY): Payer: Self-pay | Admitting: *Deleted

## 2014-11-07 ENCOUNTER — Emergency Department (HOSPITAL_COMMUNITY): Payer: 59

## 2014-11-07 ENCOUNTER — Emergency Department (HOSPITAL_COMMUNITY)
Admission: EM | Admit: 2014-11-07 | Discharge: 2014-11-07 | Disposition: A | Discharge status: Home / Self Care | Payer: 59 | Attending: Emergency Medicine | Admitting: Emergency Medicine

## 2014-11-07 DIAGNOSIS — Z872 Personal history of diseases of the skin and subcutaneous tissue: Secondary | ICD-10-CM | POA: Insufficient documentation

## 2014-11-07 DIAGNOSIS — I10 Essential (primary) hypertension: Secondary | ICD-10-CM | POA: Insufficient documentation

## 2014-11-07 DIAGNOSIS — Z791 Long term (current) use of non-steroidal anti-inflammatories (NSAID): Secondary | ICD-10-CM | POA: Insufficient documentation

## 2014-11-07 DIAGNOSIS — Z7952 Long term (current) use of systemic steroids: Secondary | ICD-10-CM | POA: Insufficient documentation

## 2014-11-07 DIAGNOSIS — R519 Headache, unspecified: Secondary | ICD-10-CM

## 2014-11-07 DIAGNOSIS — E119 Type 2 diabetes mellitus without complications: Secondary | ICD-10-CM | POA: Insufficient documentation

## 2014-11-07 DIAGNOSIS — K219 Gastro-esophageal reflux disease without esophagitis: Secondary | ICD-10-CM | POA: Insufficient documentation

## 2014-11-07 DIAGNOSIS — Z862 Personal history of diseases of the blood and blood-forming organs and certain disorders involving the immune mechanism: Secondary | ICD-10-CM | POA: Insufficient documentation

## 2014-11-07 DIAGNOSIS — R51 Headache: Secondary | ICD-10-CM | POA: Insufficient documentation

## 2014-11-07 DIAGNOSIS — Z79899 Other long term (current) drug therapy: Secondary | ICD-10-CM | POA: Insufficient documentation

## 2014-11-07 LAB — CBC WITH DIFFERENTIAL/PLATELET
BASOS ABS: 0.1 10*3/uL (ref 0.0–0.1)
BASOS PCT: 1 %
EOS ABS: 0.2 10*3/uL (ref 0.0–0.7)
Eosinophils Relative: 3 %
HEMATOCRIT: 45.4 % (ref 39.0–52.0)
Hemoglobin: 15.3 g/dL (ref 13.0–17.0)
Lymphocytes Relative: 33 %
Lymphs Abs: 1.8 10*3/uL (ref 0.7–4.0)
MCH: 29.4 pg (ref 26.0–34.0)
MCHC: 33.7 g/dL (ref 30.0–36.0)
MCV: 87.3 fL (ref 78.0–100.0)
MONOS PCT: 9 %
Monocytes Absolute: 0.5 10*3/uL (ref 0.1–1.0)
NEUTROS ABS: 3 10*3/uL (ref 1.7–7.7)
NEUTROS PCT: 54 %
Platelets: 268 10*3/uL (ref 150–400)
RBC: 5.2 MIL/uL (ref 4.22–5.81)
RDW: 13 % (ref 11.5–15.5)
WBC: 5.5 10*3/uL (ref 4.0–10.5)

## 2014-11-07 LAB — BASIC METABOLIC PANEL
Anion gap: 10 (ref 5–15)
BUN: 10 mg/dL (ref 6–20)
CALCIUM: 9.3 mg/dL (ref 8.9–10.3)
CO2: 26 mmol/L (ref 22–32)
CREATININE: 0.79 mg/dL (ref 0.61–1.24)
Chloride: 101 mmol/L (ref 101–111)
GFR calc Af Amer: >60 mL/min (ref >60)
GFR calc non Af Amer: >60 mL/min (ref >60)
Glucose, Bld: 194 mg/dL — ABNORMAL HIGH (ref 65–99)
Potassium: 4.3 mmol/L (ref 3.5–5.1)
SODIUM: 137 mmol/L (ref 135–145)

## 2014-11-07 MED ORDER — DIPHENHYDRAMINE HCL 50 MG/ML IJ SOLN
50.0000 mg | Freq: Once | INTRAMUSCULAR | Status: AC
  Administered 2014-11-07: 50 mg via INTRAVENOUS
  Filled 2014-11-07: qty 1

## 2014-11-07 MED ORDER — SODIUM CHLORIDE 0.9 % IV BOLUS (SEPSIS)
1000.0000 mL | Freq: Once | INTRAVENOUS | Status: AC
  Administered 2014-11-07: 1000 mL via INTRAVENOUS

## 2014-11-07 MED ORDER — PROCHLORPERAZINE EDISYLATE 5 MG/ML IJ SOLN
10.0000 mg | Freq: Once | INTRAMUSCULAR | Status: AC
  Administered 2014-11-07: 10 mg via INTRAVENOUS
  Filled 2014-11-07: qty 2

## 2014-11-07 MED ORDER — METOCLOPRAMIDE HCL 5 MG/ML IJ SOLN
10.0000 mg | Freq: Once | INTRAMUSCULAR | Status: AC
Start: 2014-11-07 — End: 2014-11-07
  Administered 2014-11-07: 10 mg via INTRAVENOUS
  Filled 2014-11-07: qty 2

## 2014-11-07 MED ORDER — NAPROXEN 500 MG PO TABS: 500.0000 mg | ORAL_TABLET | Freq: Two times a day (BID) | ORAL | Status: DC

## 2014-11-07 MED ORDER — KETOROLAC TROMETHAMINE 30 MG/ML IJ SOLN
30.0000 mg | Freq: Once | INTRAMUSCULAR | Status: AC
  Administered 2014-11-07: 30 mg via INTRAVENOUS
  Filled 2014-11-07: qty 1

## 2014-11-07 NOTE — ED Notes (Signed)
The pt is c/o lt sided head pain for 2 days with dizziness.  Steady gait

## 2014-11-07 NOTE — Discharge Instructions (Signed)
Please read and follow all provided instructions.  Your diagnoses today include:  1. Acute nonintractable headache, unspecified headache type     Tests performed today include:  CT of your head which was normal and did not show any serious cause of your headache  Vital signs. See below for your results today.   Medications:  In the Emergency Department you received:  Reglan - antinausea/headache medication  Compazine - antinausea/headache medication  Benadryl - antihistamine to counteract potential side effects of reglan  Toradol - NSAID medication similar to ibuprofen  Take any prescribed medications only as directed.  Additional information:  Follow any educational materials contained in this packet.  You are having a headache. No specific cause was found today for your headache. It may have been a migraine or other cause of headache. Stress, anxiety, fatigue, and depression are common triggers for headaches.   Your headache today does not appear to be life-threatening or require hospitalization, but often the exact cause of headaches is not determined in the emergency department. Therefore, follow-up with your doctor is very important to find out what may have caused your headache and whether or not you need any further diagnostic testing or treatment.   Sometimes headaches can appear benign (not harmful), but then more serious symptoms can develop which should prompt an immediate re-evaluation by your doctor or the emergency department.  BE VERY CAREFUL not to take multiple medicines containing Tylenol (also called acetaminophen). Doing so can lead to an overdose which can damage your liver and cause liver failure and possibly death.   Follow-up instructions: Please follow-up with your primary care provider in the next 2 days for further evaluation of your symptoms.   Return instructions:   Please return to the Emergency Department if you experience worsening  symptoms.  Return if the medications do not resolve your headache, if it recurs, or if you have multiple episodes of vomiting or cannot keep down fluids.  Return if you have a change from the usual headache.  RETURN IMMEDIATELY IF you:  Develop a sudden, severe headache  Develop confusion or become poorly responsive or faint  Develop a fever above 100.76F or problem breathing  Have a change in speech, vision, swallowing, or understanding  Develop new weakness, numbness, tingling, incoordination in your arms or legs  Have a seizure  Please return if you have any other emergent concerns.  Additional Information:  Your vital signs today were: BP 137/64 mmHg   Pulse 66   Temp(Src) 98 F (36.7 C)   Resp 24   Ht 5' (1.524 m)   Wt 262 lb 8 oz (119.069 kg)   BMI 51.27 kg/m2   SpO2 98% If your blood pressure (BP) was elevated above 135/85 this visit, please have this repeated by your doctor within one month. --------------

## 2014-11-07 NOTE — ED Provider Notes (Signed)
CSN: [id number]     Arrival date & time 11/07/14  5277 History   None    Chief Complaint  Patient presents with  . Headache     (Consider location/radiation/quality/duration/timing/severity/associated sxs/prior Treatment) HPI Comments: Patient with history of diabetes, hypertension, hyperlipidemia -- presents with acute onset of severe left-sided headache starting yesterday morning. Pain is described as throbbing. It does not radiate including into his neck. No fevers, sinus pressure, toothaches. He is taking over-the-counter medications without relief. No vomiting, vision change or loss, weakness in arms or legs. He is ambulatory without difficulty. Patient does not have a history of headaches. Course is constant. Nothing makes symptoms better. Palpation of the left scalp makes the pain worse.  Patient is a 54 y.o. male presenting with headaches. The history is provided by the patient.  Headache Associated symptoms: no congestion, no fever, no nausea, no neck pain, no neck stiffness, no numbness, no photophobia, no sinus pressure, no vomiting and no weakness     Past Medical History  Diagnosis Date  . Diabetes mellitus   . GERD (gastroesophageal reflux disease)   . Hyperlipidemia   . Anemia   . Hypertension   . Snores   . Sebaceous cyst 11/10/2012  . Environmental allergies 11/27/2011   Past Surgical History  Procedure Laterality Date  . Spine surgery  2004    lumb  . Appendectomy    . Mass excision Bilateral 11/24/2012    Procedure: EXCISION SCALP MASS X2;  Surgeon: [name], MD;  Location: [place];  Service: General;  Laterality: Bilateral;   No family history on file. Social History  Substance Use Topics  . Smoking status: Never Smoker   . Smokeless tobacco: Never Used  . Alcohol Use: No    Review of Systems  Constitutional: Negative for fever.  HENT: Negative for congestion, dental problem, rhinorrhea and sinus pressure.   Eyes: Negative  for photophobia, discharge, redness and visual disturbance.  Respiratory: Negative for shortness of breath.   Cardiovascular: Negative for chest pain.  Gastrointestinal: Negative for nausea and vomiting.  Musculoskeletal: Negative for gait problem, neck pain and neck stiffness.  Skin: Negative for rash.  Neurological: Positive for headaches. Negative for syncope, speech difficulty, weakness, light-headedness and numbness.  Psychiatric/Behavioral: Negative for confusion.      Allergies  Review of patient's allergies indicates no known allergies.  Home Medications   Prior to Admission medications   Medication Sig Start Date End Date Taking? Authorizing Provider  Blood Glucose Monitoring Suppl (BLOOD GLUCOSE METER) kit Use as instructed 02/03/13   [name], MD  glucose blood test strip To check blood sugars once daily dx code 250.00 02/03/13   [name], MD  hydrocortisone cream 1 % Apply 1 application topically 2 (two) times daily. Proctocort HC to apply twice daily. 08/03/14   [name], MD  Lancets MISC To check blood sugars once daily, dx code 250.00 02/03/13   [name], MD  losartan (COZAAR) 50 MG tablet Take 1 tablet (50 mg total) by mouth every morning. 01/26/14   Ezekiel Slocumb, PA-C  metFORMIN (GLUCOPHAGE) 500 MG tablet Take 1 tablet (500 mg total) by mouth 2 (two) times daily with a meal. 01/26/14   Ezekiel Slocumb, PA-C  naproxen (NAPROSYN) 500 MG tablet Take 1 tablet (500 mg total) by mouth 2 (two) times daily with a meal. 05/21/14   Orma Flaming, MD  oxyCODONE-acetaminophen (PERCOCET) 5-325 MG per tablet Take  1 tablet by mouth every 4 (four) hours as needed for moderate pain. 05/14/14   [name], MD  ranitidine (ZANTAC) 300 MG tablet TAKE 1 TABLET BY MOUTH AT BEDTIME. 08/04/14   Mancel Bale, PA-C   BP 137/64 mmHg  Pulse 66  Temp(Src) 98 F (36.7 C)  Resp 24  Ht 5' (1.524 m)  Wt 262 lb 8 oz (119.069 kg)  BMI 51.27 kg/m2  SpO2 98%   Physical Exam   Constitutional: He is oriented to person, place, and time. He appears well-developed and well-nourished. He appears distressed.  Patient holding the left side of his head, every few seconds wincing in pain.  HENT:  Head: Normocephalic and atraumatic.  Right Ear: Tympanic membrane, external ear and ear canal normal.  Left Ear: Tympanic membrane, external ear and ear canal normal.  Nose: Nose normal.  Mouth/Throat: Uvula is midline, oropharynx is clear and moist and mucous membranes are normal.  Tenderness to palpation of the left parietal scalp. Normal skin examination in this area.  Eyes: Conjunctivae, EOM and lids are normal. Pupils are equal, round, and reactive to light.  Neck: Normal range of motion. Neck supple.  Cardiovascular: Normal rate and regular rhythm.   Pulmonary/Chest: Effort normal and breath sounds normal.  Abdominal: Soft. There is no tenderness.  Musculoskeletal: Normal range of motion.       Cervical back: He exhibits normal range of motion, no tenderness and no bony tenderness.  Neurological: He is alert and oriented to person, place, and time. He has normal strength and normal reflexes. No cranial nerve deficit or sensory deficit. He exhibits normal muscle tone. He displays a negative Romberg sign. Coordination and gait normal. GCS eye subscore is 4. GCS verbal subscore is 5. GCS motor subscore is 6.  Skin: Skin is warm and dry.  Psychiatric: He has a normal mood and affect.  Nursing note and vitals reviewed.   ED Course  Procedures (including critical care time) Labs Review Labs Reviewed  BASIC METABOLIC PANEL - Abnormal; Notable for the following:    Glucose, Bld 194 (*)    All other components within normal limits  CBC WITH DIFFERENTIAL/PLATELET    Imaging Review Ct Head Wo Contrast  11/07/2014  CLINICAL DATA:  54 year old male with left-sided headache for the past 2 days. EXAM: CT HEAD WITHOUT CONTRAST TECHNIQUE: Contiguous axial images were obtained from  the base of the skull through the vertex without intravenous contrast. COMPARISON:  No priors. FINDINGS: No acute intracranial abnormalities. Specifically, no evidence of acute intracranial hemorrhage, no definite findings of acute/subacute cerebral ischemia, no mass, mass effect, hydrocephalus or abnormal intra or extra-axial fluid collections. Visualized paranasal sinuses and mastoids are well pneumatized, with exception of some mild multifocal mucosal thickening in the ethmoid sinuses bilaterally. No acute displaced skull fractures are identified. IMPRESSION: *No acute intracranial abnormalities. *The appearance of the brain is normal. Electronically Signed   By: Vinnie Langton M.D.   On: 11/07/2014 07:35   I have personally reviewed and evaluated these images and lab results as part of my medical decision-making.   EKG Interpretation None       6:53 AM Patient seen and examined. Work-up initiated. Medications ordered.   Vital signs reviewed and are as follows: BP 137/64 mmHg  Pulse 66  Temp(Src) 98 F (36.7 C)  Resp 24  Ht 5' (1.524 m)  Wt 262 lb 8 oz (119.069 kg)  BMI 51.27 kg/m2  SpO2 98%  8:12 AM patient updated  on head CT results. He still has pain despite medicine ordered by Dr. Claudine Mouton. Toradol ordered. Will reevaluate.  9:29 AM Patient has had good relief with toradol and a heat pad applied to the scalp. Patient called out and states that he is ready to go home.   Will d/c with NSAIDs and PCP follow-up.   Patient counseled to return if they have weakness in their arms or legs, slurred speech, trouble walking or talking, confusion, trouble with their balance, or if they have any other concerns. Patient verbalizes understanding and agrees with plan.    MDM   Final diagnoses:  Acute nonintractable headache, unspecified headache type   Patient with R parietal HA. No certain cause. Worse with palpation of the parietal scalp. No pain over temporal arteries.   Patient without  high-risk features of headache including: sudden onset/thunderclap HA, no similar headache in past, altered mental status, accompanying seizure, headache with exertion, history of immunocompromise, neck or shoulder pain, fever, use of anticoagulation, family history of spontaneous SAH, concomitant drug use, toxic exposure.   Patient has a normal complete neurological exam, normal vital signs, normal level of consciousness, no signs of meningismus, is well-appearing/non-toxic appearing, no signs of trauma, no pain over the temporal arteries.   No dangerous or life-threatening conditions suspected or identified by history, physical exam, and by work-up. No indications for hospitalization identified.     Carlisle Cater, PA-C 11/07/14 [place], MD 11/07/14 [phone]

## 2014-12-21 ENCOUNTER — Ambulatory Visit (INDEPENDENT_AMBULATORY_CARE_PROVIDER_SITE_OTHER): Payer: 59 | Admitting: Emergency Medicine

## 2014-12-21 ENCOUNTER — Encounter: Payer: Self-pay | Admitting: Emergency Medicine

## 2014-12-21 VITALS — BP 130/76 | HR 55 | Temp 98.4°F | Resp 16 | Ht 64.5 in | Wt 259.0 lb

## 2014-12-21 DIAGNOSIS — K219 Gastro-esophageal reflux disease without esophagitis: Secondary | ICD-10-CM | POA: Diagnosis not present

## 2014-12-21 DIAGNOSIS — E785 Hyperlipidemia, unspecified: Secondary | ICD-10-CM

## 2014-12-21 DIAGNOSIS — I10 Essential (primary) hypertension: Secondary | ICD-10-CM | POA: Diagnosis not present

## 2014-12-21 DIAGNOSIS — E119 Type 2 diabetes mellitus without complications: Secondary | ICD-10-CM | POA: Diagnosis not present

## 2014-12-21 DIAGNOSIS — Z125 Encounter for screening for malignant neoplasm of prostate: Secondary | ICD-10-CM

## 2014-12-21 DIAGNOSIS — Z1159 Encounter for screening for other viral diseases: Secondary | ICD-10-CM | POA: Diagnosis not present

## 2014-12-21 LAB — HM DIABETES EYE EXAM

## 2014-12-21 LAB — LIPID PANEL
CHOL/HDL RATIO: 3.6 ratio (ref <=5.0)
Cholesterol: 199 mg/dL (ref 125–200)
HDL: 55 mg/dL (ref >=40)
LDL CALC: 124 mg/dL (ref <130)
Triglycerides: 102 mg/dL (ref <150)
VLDL: 20 mg/dL (ref <30)

## 2014-12-21 LAB — BASIC METABOLIC PANEL WITH GFR
BUN: 12 mg/dL (ref 7–25)
CHLORIDE: 100 mmol/L (ref 98–110)
CO2: 26 mmol/L (ref 20–31)
Calcium: 9.1 mg/dL (ref 8.6–10.3)
Creat: 0.77 mg/dL (ref 0.70–1.33)
GFR, Est African American: >89 mL/min (ref >=60)
GLUCOSE: 109 mg/dL — AB (ref 65–99)
POTASSIUM: 4.3 mmol/L (ref 3.5–5.3)
SODIUM: 137 mmol/L (ref 135–146)

## 2014-12-21 LAB — POCT GLYCOSYLATED HEMOGLOBIN (HGB A1C): Hemoglobin A1C: 6.5

## 2014-12-21 LAB — GLUCOSE, POCT (MANUAL RESULT ENTRY): POC Glucose: 118 mg/dl — AB (ref 70–99)

## 2014-12-21 LAB — HEPATITIS C ANTIBODY: HCV Ab: NEGATIVE

## 2014-12-21 MED ORDER — LOSARTAN POTASSIUM 50 MG PO TABS: 50.0000 mg | ORAL_TABLET | Freq: Every morning | ORAL | Status: DC

## 2014-12-21 MED ORDER — METFORMIN HCL 500 MG PO TABS: 500.0000 mg | ORAL_TABLET | Freq: Two times a day (BID) | ORAL | Status: DC

## 2014-12-21 MED ORDER — RANITIDINE HCL 300 MG PO TABS: ORAL_TABLET | ORAL | Status: DC

## 2014-12-21 NOTE — Patient Instructions (Signed)
There are no changes in your medications. Your A1c is 6.5.

## 2014-12-21 NOTE — Progress Notes (Signed)
Subjective:  This chart was scribed for [name], MD by Tamsen Roers, at [facility].  This patient was seen in room 21 and the patient's care was started at 8:02 AM.    Patient ID: Matthew Garner, male    DOB: 03-26-1960, 54 y.o.   MRN: [id number] Chief Complaint  Patient presents with  . Follow-up  . Diabetes  . Medication Refill    HPI  HPI Comments: Matthew Garner is a 54 y.o. male with a history of diabetes who presents to the [facility] for a follow up and blood work.  Patient is unsure if his sugar is doing better.  He is up to date with his flu shot.  He will be going to his eye doctor today.   Knee: Patient notes that he is having surgery once again on his leg.  His previous surgery was done by Dr. Theda Sers (last seen in November) who told him that he was not able to do the second surgery. Patient had fluid drawn from the area and had an MRI completed which revealed a bakers cyst that was leaking from the back of his knee as well as torn ligaments.  Patient was referred to [facility] for the second surgery.   He notes that is now going to go to [facility] to see Dr. Redmond Pulling (January 11th).  Patient Active Problem List   Diagnosis Date Noted  . Nonspecific abnormal electrocardiogram (ECG) (EKG) 04/21/2014  . Spinal stenosis of lumbar region 10/27/2013  . Pain of left calf 06/16/2013  . Diabetes ([place]) 11/27/2011  . Hyperlipidemia 11/27/2011  . Hypertension 11/27/2011   Past Medical History  Diagnosis Date  . Diabetes mellitus   . GERD (gastroesophageal reflux disease)   . Hyperlipidemia   . Anemia   . Hypertension   . Snores   . Sebaceous cyst 11/10/2012  . Environmental allergies 11/27/2011   Past Surgical History  Procedure Laterality Date  . Spine surgery  2004    lumb  . Appendectomy    . Mass excision Bilateral 11/24/2012    Procedure: EXCISION SCALP MASS X2;  Surgeon: [name], MD;  Location: [place];  Service: General;  Laterality: Bilateral;   No Known Allergies Prior to Admission medications   Medication Sig Start Date End Date Taking? Authorizing Provider  Blood Glucose Monitoring Suppl (BLOOD GLUCOSE METER) kit Use as instructed 02/03/13  Yes [name], MD  glucose blood test strip To check blood sugars once daily dx code 250.00 02/03/13  Yes [name], MD  Lancets MISC To check blood sugars once daily, dx code 250.00 02/03/13  Yes [name], MD  losartan (COZAAR) 50 MG tablet Take 1 tablet (50 mg total) by mouth every morning. 12/21/14  Yes [name], MD  metFORMIN (GLUCOPHAGE) 500 MG tablet Take 1 tablet (500 mg total) by mouth 2 (two) times daily with a meal. 12/21/14  Yes [name], MD  naproxen (NAPROSYN) 500 MG tablet Take 1 tablet (500 mg total) by mouth 2 (two) times daily. 11/07/14  Yes Carlisle Cater, PA-C  ranitidine (ZANTAC) 300 MG tablet TAKE 1 TABLET BY MOUTH AT BEDTIME. 12/21/14  Yes [name], MD   Social History   Social History  . Marital Status: Married    Spouse Name: N/A  . Number of Children: N/A  . Years of Education: N/A   Occupational History  .  Not on file.   Social History Main Topics  . Smoking status: Never Smoker   . Smokeless tobacco: Never Used  . Alcohol Use: No  . Drug Use: No  . Sexual Activity: Not on file   Other Topics Concern  . Not on file   Social History Narrative     Review of Systems  Gastrointestinal: Negative for nausea and vomiting.  Musculoskeletal: Negative for neck pain and neck stiffness.       Objective:   Physical Exam Filed Vitals:   12/21/14 0750  BP: 130/76  Pulse: 55  Temp: 98.4 F (36.9 C)  Resp: 16  Height: 5' 4.5" (1.638 m)  Weight: 259 lb (117.482 kg)    CONSTITUTIONAL: Well developed/well nourished HEAD: Normocephalic/atraumatic EYES: EOMI/PERRL ENMT: Mucous membranes moist NECK: supple no meningeal signs SPINE/BACK:entire spine nontender CV: S1/S2  noted, no murmurs/rubs/gallops noted, No carotid bruit LUNGS: Lungs are clear to auscultation bilaterally, no apparent distress ABDOMEN:Obese, no masses NEURO: Pt is awake/alert/appropriate, moves all extremitiesx4.  No facial droop.   EXTREMITIES: Tenderness and swelling behind the right knee.  Pulses 2+, no edema, filament test normal.  SKIN: warm, color normal PSYCH: no abnormalities of mood noted, alert and oriented to situation Results for orders placed or performed in visit on 12/21/14  POCT glucose (manual entry)  Result Value Ref Range   POC Glucose 118 (A) 70 - 99 mg/dl  POCT glycosylated hemoglobin (Hb A1C)  Result Value Ref Range   Hemoglobin A1C 6.5          Assessment & Plan:  Hemoglobin A1c is 6.5. Blood pressure is at goal. He needs to continue to work on weight loss and diet because of his obesity. He is scheduled to go to [facility] to see Dr. Redmond Pulling regarding a recurrent popliteal cyst of the right knee. I personally performed the services described in this documentation, which was scribed in my presence. The recorded information has been reviewed and is accurate.Gumaro Brightbill MD.

## 2014-12-22 LAB — PSA: PSA: 1.34 ng/mL (ref <=4.00)

## 2015-01-10 ENCOUNTER — Encounter: Payer: Self-pay | Admitting: Family Medicine

## 2015-03-21 ENCOUNTER — Encounter (HOSPITAL_COMMUNITY): Payer: Self-pay | Admitting: Emergency Medicine

## 2015-03-21 ENCOUNTER — Emergency Department (HOSPITAL_COMMUNITY): Payer: 59

## 2015-03-21 ENCOUNTER — Emergency Department (HOSPITAL_COMMUNITY)
Admission: EM | Admit: 2015-03-21 | Discharge: 2015-03-21 | Disposition: A | Discharge status: Home / Self Care | Payer: 59 | Attending: Emergency Medicine | Admitting: Emergency Medicine

## 2015-03-21 DIAGNOSIS — Z79899 Other long term (current) drug therapy: Secondary | ICD-10-CM | POA: Diagnosis not present

## 2015-03-21 DIAGNOSIS — E119 Type 2 diabetes mellitus without complications: Secondary | ICD-10-CM | POA: Insufficient documentation

## 2015-03-21 DIAGNOSIS — I10 Essential (primary) hypertension: Secondary | ICD-10-CM | POA: Insufficient documentation

## 2015-03-21 DIAGNOSIS — Z7984 Long term (current) use of oral hypoglycemic drugs: Secondary | ICD-10-CM | POA: Diagnosis not present

## 2015-03-21 DIAGNOSIS — Z872 Personal history of diseases of the skin and subcutaneous tissue: Secondary | ICD-10-CM | POA: Insufficient documentation

## 2015-03-21 DIAGNOSIS — K219 Gastro-esophageal reflux disease without esophagitis: Secondary | ICD-10-CM | POA: Insufficient documentation

## 2015-03-21 DIAGNOSIS — M25561 Pain in right knee: Secondary | ICD-10-CM | POA: Diagnosis not present

## 2015-03-21 DIAGNOSIS — Z862 Personal history of diseases of the blood and blood-forming organs and certain disorders involving the immune mechanism: Secondary | ICD-10-CM | POA: Insufficient documentation

## 2015-03-21 DIAGNOSIS — M25562 Pain in left knee: Secondary | ICD-10-CM | POA: Diagnosis not present

## 2015-03-21 MED ORDER — IBUPROFEN 800 MG PO TABS: 800.0000 mg | ORAL_TABLET | Freq: Three times a day (TID) | ORAL | Status: DC

## 2015-03-21 MED ORDER — HYDROCODONE-ACETAMINOPHEN 5-325 MG PO TABS: 1.0000 | ORAL_TABLET | ORAL | Status: DC | PRN

## 2015-03-21 NOTE — ED Provider Notes (Signed)
CSN: [id number]     Arrival date & time 03/21/15  1527 History  By signing my name below, I, [name], attest that this documentation has been prepared under the direction and in the presence of Elizabeth C. Westfall, PA-C. Electronically Signed: [name], ED Scribe. 03/21/2015. 5:27 PM.   Chief Complaint  Patient presents with  . Knee Pain    The history is provided by the patient. No language interpreter was used.     HPI Comments: Matthew Garner is a 55 y.o. male who presents to the Emergency Department complaining of constant, moderate, bilateral, right greater than left, knee pain onset earlier today. Pt was seen by a physician through work PTA who provided a Toradol injection which he says provided no relief of his pain and was told to come to the ED for imaging of his knees. His pain worsens with movement and when bearing weight and denies being able to currently ambulate due to pain. Pt works in a [employer] and does a lot of heavy lifting and ambulates frequently but denies an abnormal increase in exertion recently. Pt notes a SHx to his right knee. He denies numbness, tingling or any other associated symptoms at this time.   Past Medical History  Diagnosis Date  . Diabetes mellitus   . GERD (gastroesophageal reflux disease)   . Hyperlipidemia   . Anemia   . Hypertension   . Snores   . Sebaceous cyst 11/10/2012  . Environmental allergies 11/27/2011   Past Surgical History  Procedure Laterality Date  . Spine surgery  2004    lumb  . Appendectomy    . Mass excision Bilateral 11/24/2012    Procedure: EXCISION SCALP MASS X2;  Surgeon: [name], MD;  Location: [place];  Service: General;  Laterality: Bilateral;   No family history on file. Social History  Substance Use Topics  . Smoking status: Never Smoker   . Smokeless tobacco: Never Used  . Alcohol Use: No      Review of Systems  Musculoskeletal: Positive for joint  swelling and arthralgias.  Skin: Negative for color change and wound.  Neurological: Negative for numbness.    Allergies  Review of patient's allergies indicates no known allergies.  Home Medications   Prior to Admission medications   Medication Sig Start Date End Date Taking? Authorizing Provider  Blood Glucose Monitoring Suppl (BLOOD GLUCOSE METER) kit Use as instructed 02/03/13   [name], MD  glucose blood test strip To check blood sugars once daily dx code 250.00 02/03/13   [name], MD  HYDROcodone-acetaminophen (NORCO/VICODIN) 5-325 MG tablet Take 1-2 tablets by mouth every 4 (four) hours as needed. 03/21/15   Marella Chimes, PA-C  ibuprofen (ADVIL,MOTRIN) 800 MG tablet Take 1 tablet (800 mg total) by mouth 3 (three) times daily. 03/21/15   Marella Chimes, PA-C  Lancets MISC To check blood sugars once daily, dx code 250.00 02/03/13   [name], MD  losartan (COZAAR) 50 MG tablet Take 1 tablet (50 mg total) by mouth every morning. 12/21/14   [name], MD  metFORMIN (GLUCOPHAGE) 500 MG tablet Take 1 tablet (500 mg total) by mouth 2 (two) times daily with a meal. 12/21/14   [name], MD  naproxen (NAPROSYN) 500 MG tablet Take 1 tablet (500 mg total) by mouth 2 (two) times daily. 11/07/14   Carlisle Cater, PA-C  ranitidine (ZANTAC) 300 MG tablet TAKE 1 TABLET BY MOUTH  AT BEDTIME. 12/21/14   [name], MD    BP 147/82 mmHg  Pulse 82  Temp(Src) 98.5 F (36.9 C) (Oral)  Resp 18  SpO2 99%  Physical Exam  Constitutional: He is oriented to person, place, and time. He appears well-developed and well-nourished. No distress.  HENT:  Head: Normocephalic and atraumatic.  Right Ear: External ear normal.  Left Ear: External ear normal.  Nose: Nose normal.  Eyes: Conjunctivae and EOM are normal. Right eye exhibits no discharge. Left eye exhibits no discharge. No scleral icterus.  Neck: Normal range of motion. Neck supple.  Cardiovascular: Normal rate, regular  rhythm and intact distal pulses.   Pulmonary/Chest: Effort normal and breath sounds normal. No respiratory distress.  Abdominal: Soft.  Musculoskeletal: He exhibits tenderness. He exhibits no edema.  TTP to anterior aspect of bilateral knees with decreased ROM due to pain. Patient able to straight leg raise bilateral lower extremities. No LCL or MCL laxity. No edema, erythema, or heat.  Neurological: He is alert and oriented to person, place, and time. He has normal strength. No sensory deficit.  Skin: Skin is warm and dry. He is not diaphoretic.  Psychiatric: He has a normal mood and affect. His behavior is normal.  Nursing note and vitals reviewed.  ED Course  Procedures   DIAGNOSTIC STUDIES: Oxygen Saturation is 99% on RA, normal by my interpretation.    COORDINATION OF CARE: 5:29 PM Discussed next steps with pt. He verbalized understanding and is agreeable with the plan.   Labs Review Labs Reviewed - No data to display  Imaging Review Dg Knee Complete 4 Views Left  03/21/2015  CLINICAL DATA:  55 year old male with history of bilateral knee pain. EXAM: LEFT KNEE - COMPLETE 4+ VIEW COMPARISON:  Left knee radiograph 01/10/2010. FINDINGS: Five views of the left knee demonstrate no acute displaced fracture, subluxation or dislocation. Multifocal joint space narrowing, subchondral sclerosis, subchondral cyst formation and osteophyte formation, most pronounced in the medial and patellofemoral compartments, compatible with moderate osteoarthritis. Large suprapatellar effusion. IMPRESSION: 1. Negative for acute fracture. 2. Large suprapatellar effusion. 3. Moderate tricompartmental osteoarthritis, most severe in the medial and patellofemoral compartments. Electronically Signed   By: Vinnie Langton M.D.   On: 03/21/2015 16:24   Dg Knee Complete 4 Views Right  03/21/2015  CLINICAL DATA:  Diffuse right knee pain which began while walking today. Initial encounter. EXAM: RIGHT KNEE - COMPLETE 4+  VIEW COMPARISON:  Plain films right knee 01/10/2010. FINDINGS: No acute bony or joint abnormality identified. Osteophytosis about the knee is seen and appears somewhat worse compared with the prior study. No joint effusion is identified. IMPRESSION: Moderate osteoarthritis appears somewhat worse than on the comparison study. No acute abnormality Electronically Signed   By: Inge Rise M.D.   On: 03/21/2015 16:21   I have personally reviewed and evaluated these images as part of my medical decision-making.   EKG Interpretation None      MDM   Final diagnoses:  Bilateral knee pain    55 year old male presents with bilateral knee pain, which he states started today at work. Denies injury, though states he is on his feet throughout the day lifting heavy objects. Denies fever, chills, numbness, weakness, paresthesia. Patient is afebrile. Vital signs stable. On exam, patient has TTP to anterior aspect of bilateral knees with decreased ROM due to pain. Patient able to straight leg raise bilateral lower extremities. No LCL or MCL laxity. No edema, erythema, or heat. Patient is  NVI. Imaging of right knee remarkable for moderate osteoarthritis, no acute abnormality. Imaging of left knee remarkable for large suprapatellar effusion, moderate tricompartmental osteoarthritis. Low suspicion for septic joint or gout. Symptoms may be related to arthritis. Patient to follow-up with ortho for further evaluation and management. Will give anti-inflammatory and short course of pain medicine. Advised to rest, ice, and elevate. Return precautions discussed. Patient verbalizes his understanding and is in agreement with plan.  BP 147/82 mmHg  Pulse 82  Temp(Src) 98.5 F (36.9 C) (Oral)  Resp 18  SpO2 99%  I personally performed the services described in this documentation, which was scribed in my presence. The recorded information has been reviewed and is accurate.     Marella Chimes, PA-C 03/21/15  1856  Milton Ferguson, MD 03/23/15 2003

## 2015-03-21 NOTE — ED Notes (Signed)
PA at the [facility].

## 2015-03-21 NOTE — ED Notes (Signed)
Pt c/o sudden onset bilateral knee pain while at work today. Pt denies injury but at his job he frequently lifts heavy objects. Pt Ambulatory with pain. Pt denies swelling.

## 2015-03-21 NOTE — Discharge Instructions (Signed)
1. Medications: ibuprofen, pain medicine, usual home medications 2. Treatment: rest, drink plenty of fluids, ice, elevate 3. Follow Up: please followup with your orthopedic doctor for discussion of your diagnoses and further evaluation after today's visit; if you do not have a primary care doctor use the phone number listed in your discharge paperwork to find one; please return to the ER for increased pain, swelling, redness, new or worsening symptoms   Knee Pain Knee pain is a common problem. It can have many causes. The pain often goes away by following your doctor's home care instructions. Treatment for ongoing pain will depend on the cause of your pain. If your knee pain continues, more tests may be needed to diagnose your condition. Tests may include X-rays or other imaging studies of your knee. HOME CARE  Take medicines only as told by your doctor.  Rest your knee and keep it raised (elevated) while you are resting.  Do not do things that cause pain or make your pain worse.  Avoid activities where both feet leave the ground at the same time, such as running, jumping rope, or doing jumping jacks.  Apply ice to the knee area:  Put ice in a plastic bag.  Place a towel between your skin and the bag.  Leave the ice on for 20 minutes, 2-3 times a day.  Ask your doctor if you should wear an elastic knee support.  Sleep with a pillow under your knee.  Lose weight if you are overweight. Being overweight can make your knee hurt more.  Do not use any tobacco products, including cigarettes, chewing tobacco, or electronic cigarettes. If you need help quitting, ask your doctor. Smoking may slow the healing of any bone and joint problems that you may have. GET HELP IF:  Your knee pain does not stop, it changes, or it gets worse.  You have a fever along with knee pain.  Your knee gives out or locks up.  Your knee becomes more swollen. GET HELP RIGHT AWAY IF:   Your knee feels hot to  the touch.  You have chest pain or trouble breathing.   This information is not intended to replace advice given to you by your health care provider. Make sure you discuss any questions you have with your health care provider.   Document Released: 03/16/2008 Document Revised: 01/08/2014 Document Reviewed: 02/18/2013 Elsevier Interactive Patient Education [employer].

## 2015-03-21 NOTE — ED Notes (Signed)
Ortho at the bedside.

## 2015-03-24 ENCOUNTER — Telehealth: Payer: Self-pay | Admitting: Emergency Medicine

## 2015-03-29 ENCOUNTER — Ambulatory Visit (INDEPENDENT_AMBULATORY_CARE_PROVIDER_SITE_OTHER): Payer: 59 | Admitting: Emergency Medicine

## 2015-03-29 VITALS — BP 132/90 | HR 74 | Temp 98.2°F | Resp 16 | Ht 64.5 in | Wt 254.2 lb

## 2015-03-29 DIAGNOSIS — M25561 Pain in right knee: Secondary | ICD-10-CM | POA: Diagnosis not present

## 2015-03-29 DIAGNOSIS — M25562 Pain in left knee: Secondary | ICD-10-CM | POA: Diagnosis not present

## 2015-03-29 DIAGNOSIS — Z0289 Encounter for other administrative examinations: Secondary | ICD-10-CM

## 2015-03-29 DIAGNOSIS — E119 Type 2 diabetes mellitus without complications: Secondary | ICD-10-CM | POA: Diagnosis not present

## 2015-03-29 DIAGNOSIS — Z0283 Encounter for blood-alcohol and blood-drug test: Secondary | ICD-10-CM

## 2015-03-29 DIAGNOSIS — I10 Essential (primary) hypertension: Secondary | ICD-10-CM

## 2015-03-29 DIAGNOSIS — E785 Hyperlipidemia, unspecified: Secondary | ICD-10-CM

## 2015-03-29 LAB — BASIC METABOLIC PANEL WITH GFR
BUN: 14 mg/dL (ref 7–25)
CALCIUM: 9.8 mg/dL (ref 8.6–10.3)
CO2: 26 mmol/L (ref 20–31)
Chloride: 101 mmol/L (ref 98–110)
Creat: 0.92 mg/dL (ref 0.70–1.33)
Glucose, Bld: 138 mg/dL — ABNORMAL HIGH (ref 65–99)
Potassium: 4.3 mmol/L (ref 3.5–5.3)
SODIUM: 138 mmol/L (ref 135–146)

## 2015-03-29 LAB — POCT CBC
Granulocyte percent: 69.2 %G (ref 37–80)
HEMATOCRIT: 44 % (ref 43.5–53.7)
HEMOGLOBIN: 15.8 g/dL (ref 14.1–18.1)
Lymph, poc: 1.5 (ref 0.6–3.4)
MCH, POC: 30.6 pg (ref 27–31.2)
MCHC: 35.9 g/dL — AB (ref 31.8–35.4)
MCV: 85.4 fL (ref 80–97)
MID (CBC): 0.5 (ref 0–0.9)
MPV: 7.1 fL (ref 0–99.8)
PLATELET COUNT, POC: 316 10*3/uL (ref 142–424)
POC Granulocyte: 4.5 (ref 2–6.9)
POC LYMPH PERCENT: 22.8 %L (ref 10–50)
POC MID %: 8 %M (ref 0–12)
RBC: 5.15 M/uL (ref 4.69–6.13)
RDW, POC: 13.5 %
WBC: 6.5 10*3/uL (ref 4.6–10.2)

## 2015-03-29 LAB — GLUCOSE, POCT (MANUAL RESULT ENTRY): POC Glucose: 138 mg/dl — AB (ref 70–99)

## 2015-03-29 LAB — POCT GLYCOSYLATED HEMOGLOBIN (HGB A1C): Hemoglobin A1C: 6.5

## 2015-03-29 NOTE — Progress Notes (Addendum)
Patient ID: Matthew Garner, male   DOB: 08/04/1960, 55 y.o.   MRN: [id number]    By signing my name below, I, Matthew Garner, attest that this documentation has been prepared under the direction and in the presence of Matthew Russian, MD Electronically Signed: Ladene Garner, ED Scribe 03/29/2015 at 8:31 AM.  Chief Complaint:  Chief Complaint  Patient presents with  . Knee Pain    both knees, says it has been going on for awhile   HPI: Matthew Garner is a 55 y.o. male who reports to [facility] today complaining of gradually worsening bilateral knee pain for the past year. Pt reports increased pain with ambulating, movement and palpation. He was seen in the ED on 03/21/15 for the same and was advised to follow-up with his orthopedist. Pt has an upcoming appointment on 04/13/15 with [name], MD with [facility]. He states that he needs a note to be taken out of work until he has been seen by Dr. [name].   Diabetes Pt reports that he has lost a significant amount of weight but states that he has not been eating due to increased stress.   Wt Readings from Last 3 Encounters:  03/29/15 254 lb 3.2 oz (115.304 kg)  12/21/14 259 lb (117.482 kg)  11/07/14 262 lb 8 oz (119.069 kg)   HTN Pt suspects that his BP is elevated due to increased stress over the past 2 days. Triage BP: 132/90. Repeat BP during examination on the L: 126/80.   Past Medical History  Diagnosis Date  . Diabetes mellitus   . GERD (gastroesophageal reflux disease)   . Hyperlipidemia   . Anemia   . Hypertension   . Snores   . Sebaceous cyst 11/10/2012  . Environmental allergies 11/27/2011   Past Surgical History  Procedure Laterality Date  . Spine surgery  2004    lumb  . Appendectomy    . Mass excision Bilateral 11/24/2012    Procedure: EXCISION SCALP MASS X2;  Surgeon: [name], MD;  Location: [place];  Service: General;  Laterality: Bilateral;   Social History   Social History  . Marital  Status: Married    Spouse Name: N/A  . Number of Children: N/A  . Years of Education: N/A   Social History Main Topics  . Smoking status: Never Smoker   . Smokeless tobacco: Never Used  . Alcohol Use: No  . Drug Use: No  . Sexual Activity: Not Asked   Other Topics Concern  . None   Social History Narrative   History reviewed. No pertinent family history. No Known Allergies Prior to Admission medications   Medication Sig Start Date End Date Taking? Authorizing Provider  Blood Glucose Monitoring Suppl (BLOOD GLUCOSE METER) kit Use as instructed 02/03/13  Yes Matthew Russian, MD  glucose blood test strip To check blood sugars once daily dx code 250.00 02/03/13  Yes Matthew Russian, MD  Lancets MISC To check blood sugars once daily, dx code 250.00 02/03/13  Yes Matthew Russian, MD  losartan (COZAAR) 50 MG tablet Take 1 tablet (50 mg total) by mouth every morning. 12/21/14  Yes Matthew Russian, MD  metFORMIN (GLUCOPHAGE) 500 MG tablet Take 1 tablet (500 mg total) by mouth 2 (two) times daily with a meal. 12/21/14  Yes Matthew Russian, MD  ranitidine (ZANTAC) 300 MG tablet TAKE 1 TABLET BY MOUTH AT BEDTIME. 12/21/14  Yes Matthew Russian, MD  HYDROcodone-acetaminophen (NORCO/VICODIN) [phone]  MG tablet Take 1-2 tablets by mouth every 4 (four) hours as needed. Patient not taking: Reported on 03/29/2015 03/21/15   Marella Chimes, PA-C  ibuprofen (ADVIL,MOTRIN) 800 MG tablet Take 1 tablet (800 mg total) by mouth 3 (three) times daily. Patient not taking: Reported on 03/29/2015 03/21/15   Marella Chimes, PA-C  naproxen (NAPROSYN) 500 MG tablet Take 1 tablet (500 mg total) by mouth 2 (two) times daily. Patient not taking: Reported on 03/29/2015 11/07/14   Carlisle Cater, PA-C   ROS: The patient denies fevers, chills, night sweats, unintentional weight loss, chest pain, palpitations, wheezing, dyspnea on exertion, nausea, vomiting, abdominal pain, dysuria, hematuria, melena, numbness, weakness, or tingling.  +arthralgias   All other systems have been reviewed and were otherwise negative with the exception of those mentioned in the HPI and as above.    PHYSICAL EXAM: Filed Vitals:   03/29/15 0810  BP: 132/90  Pulse: 74  Temp: 98.2 F (36.8 C)  Resp: 16   Body mass index is 42.98 kg/(m^2). Repeat BP during examination on the L: 126/80.  General: Alert, no acute distress HEENT:  Normocephalic, atraumatic, oropharynx patent. Eye: [facility] Cardiovascular: Regular rate and rhythm, no rubs murmurs or gallops. No Carotid bruits, radial pulse intact. No pedal edema.  Respiratory: Clear to auscultation bilaterally. No wheezes, rales, or rhonchi. No cyanosis, no use of accessory musculature Abdominal: No organomegaly, abdomen is soft and non-tender, positive bowel sounds. No masses. Musculoskeletal: Severe degenerative changes of both knees with crepitation of both knees with flexion and extension.  Skin: No rashes. Neurologic: Facial musculature symmetric. Psychiatric: Patient acts appropriately throughout our interaction. Lymphatic: No cervical or submandibular lymphadenopathy  LABS:  Results for orders placed or performed in visit on 03/29/15  POCT CBC  Result Value Ref Range   WBC 6.5 4.6 - 10.2 K/uL   Lymph, poc 1.5 0.6 - 3.4   POC LYMPH PERCENT 22.8 10 - 50 %L   MID (cbc) 0.5 0 - 0.9   POC MID % 8.0 0 - 12 %M   POC Granulocyte 4.5 2 - 6.9   Granulocyte percent 69.2 37 - 80 %G   RBC 5.15 4.69 - 6.13 M/uL   Hemoglobin 15.8 14.1 - 18.1 g/dL   HCT, POC 44.0 43.5 - 53.7 %   MCV 85.4 80 - 97 fL   MCH, POC 30.6 27 - 31.2 pg   MCHC 35.9 (A) 31.8 - 35.4 g/dL   RDW, POC 13.5 %   Platelet Count, POC 316 142 - 424 K/uL   MPV 7.1 0 - 99.8 fL  POCT glucose (manual entry)  Result Value Ref Range   POC Glucose 138 (A) 70 - 99 mg/dl  POCT glycosylated hemoglobin (Hb A1C)  Result Value Ref Range   Hemoglobin A1C 6.5     EKG/XRAY:   Primary read interpreted by Dr. Everlene Farrier at  [facility].  ASSESSMENT/PLAN: Patient will follow-up with Dr. [name]. I will give him a note that he is out of work until evaluation by the orthopedist.I personally performed the services described in this documentation, which was scribed in my presence. The recorded information has been reviewed and is accurate diabetes is stable. Repeat blood pressure was in range.    Gross sideeffects, risk and benefits, and alternatives of medications d/w patient. Patient is aware that all medications have potential sideeffects and we are unable to predict every sideeffect or drug-drug interaction that may occur.  Arlyss Queen MD 03/29/2015 8:18 AM

## 2015-03-29 NOTE — Patient Instructions (Signed)
     IF you received an x-ray today, you will receive an invoice from [facility]. Please contact [facility] at [phone] with questions or concerns regarding your invoice.   IF you received labwork today, you will receive an invoice from [facility]. Please contact [facility] at [phone] with questions or concerns regarding your invoice.   Our billing staff will not be able to assist you with questions regarding bills from these companies.  You will be contacted with the lab results as soon as they are available. The fastest way to get your results is to activate your My Chart account. Instructions are located on the last page of this paperwork. If you have not heard from us regarding the results in 2 weeks, please contact this office.      

## 2015-03-30 ENCOUNTER — Telehealth: Payer: Self-pay

## 2015-03-30 NOTE — Telephone Encounter (Signed)
Patient came in to be seen yesterday and brought in two papers from another office. One was from the ER and patient can't remember the name of the other office. While he was here patient was told that we would make copies and give the papers back. Patient came to the walk in today because he wasn't given those papers back. The papers are no where to be found. I looked in the scan box and the provider's box. If located, please call patient! [phone]

## 2015-04-01 ENCOUNTER — Encounter: Payer: Self-pay | Admitting: *Deleted

## 2015-04-04 LAB — OPIATES/OPIOIDS (LC/MS-MS)
CODEINE URINE: NEGATIVE ng/mL (ref <50)
Hydrocodone: NEGATIVE ng/mL (ref <50)
Hydromorphone: NEGATIVE ng/mL (ref <50)
Morphine Urine: NEGATIVE ng/mL (ref <50)
Norhydrocodone, Ur: NEGATIVE ng/mL (ref <50)
Noroxycodone, Ur: NEGATIVE ng/mL (ref <50)
OXYCODONE, UR: NEGATIVE ng/mL (ref <50)
OXYMORPHONE, URINE: NEGATIVE ng/mL (ref <50)

## 2015-04-04 LAB — AMPHETAMINES (GC/LC/MS), URINE
AMPHETAMINE CONF, UR: NEGATIVE ng/mL (ref <250)
METHAMPHETAMINE QUANT UR: NEGATIVE ng/mL (ref <250)

## 2015-04-05 LAB — PRESCRIPTION MONITORING PROFILE (7 PANEL)
BENZODIAZEPINE SCREEN, URINE: NEGATIVE ng/mL
CANNABINOID SCRN UR: NEGATIVE ng/mL
CREATININE, URINE: 462.59 mg/dL (ref >20.0)
Cocaine Metabolites: NEGATIVE ng/mL
METHADONE SCREEN, URINE: NEGATIVE ng/mL
Nitrites, Initial: NEGATIVE ug/mL
OXYCODONE SCRN UR: NEGATIVE ng/mL
pH, Initial: 5.2 pH (ref 4.5–8.9)

## 2015-04-21 ENCOUNTER — Ambulatory Visit: Payer: 59 | Admitting: Urgent Care

## 2015-05-06 ENCOUNTER — Other Ambulatory Visit: Payer: Self-pay

## 2015-05-06 DIAGNOSIS — K219 Gastro-esophageal reflux disease without esophagitis: Secondary | ICD-10-CM

## 2015-05-06 MED ORDER — RANITIDINE HCL 300 MG PO TABS: ORAL_TABLET | ORAL | Status: DC

## 2015-05-10 ENCOUNTER — Other Ambulatory Visit: Payer: Self-pay

## 2015-05-10 DIAGNOSIS — E119 Type 2 diabetes mellitus without complications: Secondary | ICD-10-CM

## 2015-05-10 DIAGNOSIS — I10 Essential (primary) hypertension: Secondary | ICD-10-CM

## 2015-05-10 MED ORDER — LOSARTAN POTASSIUM 50 MG PO TABS: 50.0000 mg | ORAL_TABLET | Freq: Every morning | ORAL | Status: DC

## 2015-05-10 MED ORDER — METFORMIN HCL 500 MG PO TABS: 500.0000 mg | ORAL_TABLET | Freq: Two times a day (BID) | ORAL | Status: DC

## 2015-05-26 ENCOUNTER — Ambulatory Visit (INDEPENDENT_AMBULATORY_CARE_PROVIDER_SITE_OTHER): Payer: Self-pay | Admitting: Urgent Care

## 2015-05-26 VITALS — BP 136/82 | HR 64 | Temp 97.9°F | Resp 16 | Ht 64.5 in | Wt 252.0 lb

## 2015-05-26 DIAGNOSIS — Z0189 Encounter for other specified special examinations: Secondary | ICD-10-CM

## 2015-05-26 DIAGNOSIS — Z024 Encounter for examination for driving license: Secondary | ICD-10-CM

## 2015-05-26 DIAGNOSIS — I10 Essential (primary) hypertension: Secondary | ICD-10-CM

## 2015-05-26 DIAGNOSIS — E119 Type 2 diabetes mellitus without complications: Secondary | ICD-10-CM

## 2015-05-26 DIAGNOSIS — E669 Obesity, unspecified: Secondary | ICD-10-CM

## 2015-05-26 NOTE — Progress Notes (Signed)
Commercial Driver Medical Examination   Matthew Garner is a 55 y.o. male who presents today for a DOT physical exam. The patient reports diabetes, hypertension. He is compliant with his medications. Patient is asymptomatic. Denies smoking cigarettes or drinking alcohol.   The following portions of the patient's history were reviewed and updated as appropriate: allergies, current medications, past family history, past medical history, past social history and past surgical history.  Objective:   BP 136/82 mmHg  Pulse 64  Temp(Src) 97.9 F (36.6 C) (Oral)  Resp 16  Ht 5' 4.5" (1.638 m)  Wt 252 lb (114.306 kg)  BMI 42.60 kg/m2  SpO2 98%  Vision/hearing:  Visual Acuity Screening   Right eye Left eye Both eyes  Without correction: 20/20 20/20 20/20   With correction:     Comments: Titmus test was 85 degrees in both eyes.  Hearing Screening Comments: Whisper test was 64ft in both ears  Applicant can recognize and distinguish among traffic control signals and devices showing standard red, green, and amber colors.  Corrective lenses required: No  Monocular Vision?: No  Hearing aid requirement: No  Physical Exam  Constitutional: He is oriented to person, place, and time. He appears well-developed and well-nourished.  HENT:  TM's intact bilaterally, no effusions or erythema. Nasal turbinates pink and moist, nasal passages patent. No sinus tenderness. Oropharynx clear, mucous membranes moist, dentition in good repair.  Eyes: Conjunctivae and EOM are normal. Pupils are equal, round, and reactive to light. Right eye exhibits no discharge. Left eye exhibits no discharge. No scleral icterus.  Neck: Normal range of motion. Neck supple. No thyromegaly present.  Cardiovascular: Normal rate, regular rhythm and intact distal pulses.  Exam reveals no gallop and no friction rub.   No murmur heard. Pulmonary/Chest: No stridor. No respiratory distress. He has no wheezes. He has no rales.  Abdominal:  Soft. Bowel sounds are normal. He exhibits no distension and no mass. There is no tenderness.  Musculoskeletal: Normal range of motion. He exhibits no edema or tenderness.  Well-healed vertical surgical scar over lower lumbar region.  Lymphadenopathy:    He has no cervical adenopathy.  Neurological: He is alert and oriented to person, place, and time. He has normal reflexes.  Skin: Skin is warm and dry. No rash noted. No erythema. No pallor.  Psychiatric: He has a normal mood and affect.   Labs: Urine Dipstick: Sp Gr - 1.015, Protein - Trace, Blood - Trace, Sugar - Neg  Assessment:    Healthy male exam.  Meets standards, but periodic monitoring required due to HTN, DM Type 2.  Driver qualified only for 1 year.    Plan:    Medical examiners certificate completed and printed. Return as needed.

## 2015-05-26 NOTE — Patient Instructions (Addendum)
Keeping you healthy  Get these tests  Blood pressure- Have your blood pressure checked once a year by your healthcare provider.  Normal blood pressure is 120/80  Weight- Have your body mass index (BMI) calculated to screen for obesity.  BMI is a measure of body fat based on height and weight. You can also calculate your own BMI at www.nhl[place].com/bmi/.  Cholesterol- Have your cholesterol checked every year.  Diabetes- Have your blood sugar checked regularly if you have high blood pressure, high cholesterol, have a family history of diabetes or if you are overweight.  Screening for Colon Cancer- Colonoscopy starting at age 50.  Screening may begin sooner depending on your family history and other health conditions. Follow up colonoscopy as directed by your Gastroenterologist.  Screening for Prostate Cancer- Both blood work (PSA) and a rectal exam help screen for Prostate Cancer.  Screening begins at age 40 with African-American men and at age 50 with Caucasian men.  Screening may begin sooner depending on your family history.  Take these medicines  Aspirin- One aspirin daily can help prevent Heart disease and Stroke.  Flu shot- Every fall.  Tetanus- Every 10 years.  Zostavax- Once after the age of 60 to prevent Shingles.  Pneumonia shot- Once after the age of 65; if you are younger than 65, ask your healthcare provider if you need a Pneumonia shot.  Take these steps  Don't smoke- If you do smoke, talk to your doctor about quitting.  For tips on how to quit, go to www.smokefree.gov or call 1-800-QUIT-NOW.  Be physically active- Exercise 5 days a week for at least 30 minutes.  If you are not already physically active start slow and gradually work up to 30 minutes of moderate physical activity.  Examples of moderate activity include walking briskly, mowing the yard, dancing, swimming, bicycling, etc.  Eat a healthy diet- Eat a variety of healthy food such as fruits, vegetables, low  fat milk, low fat cheese, yogurt, lean meant, poultry, fish, beans, tofu, etc. For more information go to www.thenutritionsource.org  Drink alcohol in moderation- Limit alcohol intake to less than two drinks a day. Never drink and drive.  Dentist- Brush and floss twice daily; visit your dentist twice a year.  Depression- Your emotional health is as important as your physical health. If you're feeling down, or losing interest in things you would normally enjoy please talk to your healthcare provider.  Eye exam- Visit your eye doctor every year.  Safe sex- If you may be exposed to a sexually transmitted infection, use a condom.  Seat belts- Seat belts can save your life; always wear one.  Smoke/Carbon Monoxide detectors- These detectors need to be installed on the appropriate level of your home.  Replace batteries at least once a year.  Skin cancer- When out in the sun, cover up and use sunscreen 15 SPF or higher.  Violence- If anyone is threatening you, please tell your healthcare provider.  Living Will/ Health care power of attorney- Speak with your healthcare provider and family.    IF you received an x-ray today, you will receive an invoice from [facility]. Please contact [facility] at [phone] with questions or concerns regarding your invoice.   IF you received labwork today, you will receive an invoice from [facility]. Please contact [facility] at [phone] with questions or concerns regarding your invoice.   Our billing staff will not be able to assist you with questions regarding bills from these companies.    You will be contacted with the lab results as soon as they are available. The fastest way to get your results is to activate your My Chart account. Instructions are located on the last page of this paperwork. If you have not heard from us regarding the results in 2 weeks, please contact this office.      

## 2015-07-29 ENCOUNTER — Telehealth: Payer: Self-pay

## 2015-07-29 NOTE — Telephone Encounter (Signed)
Pt is wanting to come in today around 400 to get a new copy of his DOT Card he has lost the original I believe   Best number [phone]

## 2015-08-01 NOTE — Telephone Encounter (Signed)
Left message on machine that form is ready for pickup.

## 2015-09-09 ENCOUNTER — Other Ambulatory Visit: Payer: Self-pay | Admitting: Emergency Medicine

## 2015-09-09 DIAGNOSIS — E119 Type 2 diabetes mellitus without complications: Secondary | ICD-10-CM

## 2015-09-09 DIAGNOSIS — I10 Essential (primary) hypertension: Secondary | ICD-10-CM

## 2015-09-09 DIAGNOSIS — K219 Gastro-esophageal reflux disease without esophagitis: Secondary | ICD-10-CM

## 2015-09-15 ENCOUNTER — Encounter: Payer: Self-pay | Admitting: Family Medicine

## 2015-09-15 ENCOUNTER — Ambulatory Visit (INDEPENDENT_AMBULATORY_CARE_PROVIDER_SITE_OTHER): Payer: 59 | Admitting: Family Medicine

## 2015-09-15 VITALS — BP 126/82 | HR 74 | Temp 98.3°F | Resp 18 | Ht 64.5 in | Wt 244.0 lb

## 2015-09-15 DIAGNOSIS — M549 Dorsalgia, unspecified: Secondary | ICD-10-CM

## 2015-09-15 DIAGNOSIS — G8929 Other chronic pain: Secondary | ICD-10-CM | POA: Diagnosis not present

## 2015-09-15 DIAGNOSIS — Z13220 Encounter for screening for lipoid disorders: Secondary | ICD-10-CM

## 2015-09-15 DIAGNOSIS — E119 Type 2 diabetes mellitus without complications: Secondary | ICD-10-CM

## 2015-09-15 DIAGNOSIS — I10 Essential (primary) hypertension: Secondary | ICD-10-CM

## 2015-09-15 LAB — COMPLETE METABOLIC PANEL WITH GFR
ALBUMIN: 4.4 g/dL (ref 3.6–5.1)
ALK PHOS: 66 U/L (ref 40–115)
ALT: 15 U/L (ref 9–46)
AST: 15 U/L (ref 10–35)
BUN: 12 mg/dL (ref 7–25)
CALCIUM: 9.5 mg/dL (ref 8.6–10.3)
CHLORIDE: 100 mmol/L (ref 98–110)
CO2: 30 mmol/L (ref 20–31)
CREATININE: 0.76 mg/dL (ref 0.70–1.33)
GFR, Est African American: >89 mL/min (ref >=60)
GFR, Est Non African American: >89 mL/min (ref >=60)
Glucose, Bld: 97 mg/dL (ref 65–99)
Potassium: 4.2 mmol/L (ref 3.5–5.3)
Sodium: 137 mmol/L (ref 135–146)
Total Bilirubin: 0.5 mg/dL (ref 0.2–1.2)
Total Protein: 7.1 g/dL (ref 6.1–8.1)

## 2015-09-15 LAB — LIPID PANEL
CHOLESTEROL: 190 mg/dL (ref 125–200)
HDL: 64 mg/dL (ref >=40)
LDL Cholesterol: 113 mg/dL (ref <130)
Total CHOL/HDL Ratio: 3 Ratio (ref <=5.0)
Triglycerides: 67 mg/dL (ref <150)
VLDL: 13 mg/dL (ref <30)

## 2015-09-15 LAB — HEMOGLOBIN A1C
Hgb A1c MFr Bld: 6.2 % — ABNORMAL HIGH (ref <5.7)
Mean Plasma Glucose: 131 mg/dL

## 2015-09-15 MED ORDER — METFORMIN HCL 500 MG PO TABS: 500.0000 mg | ORAL_TABLET | Freq: Every day | ORAL | 1 refills | Status: DC

## 2015-09-15 MED ORDER — GLUCOSE BLOOD VI STRP: ORAL_STRIP | 6 refills | Status: DC

## 2015-09-15 MED ORDER — LOSARTAN POTASSIUM 50 MG PO TABS: 50.0000 mg | ORAL_TABLET | Freq: Every morning | ORAL | 1 refills | Status: DC

## 2015-09-15 MED ORDER — LANCETS MISC: 6 refills | Status: DC

## 2015-09-15 MED ORDER — BLOOD GLUCOSE MONITOR KIT: PACK | 0 refills | Status: DC

## 2015-09-15 NOTE — Progress Notes (Addendum)
Subjective:  By signing my name below, I, Matthew Garner, attest that this documentation has been prepared under the direction and in the presence of Matthew Ray, MD. Electronically Signed: Moises Garner, Matthew Garner. 09/15/2015 , 3:10 PM .  Patient was seen in Room 3 .   Patient ID: Matthew Garner, male    DOB: 03-Feb-1960, 55 y.o.   MRN: [id number] Chief Complaint  Patient presents with  . Follow-up    DIABETES   HPI Matthew Garner is a 55 y.o. male Here for follow up of diabetes. He is a previous patient of Matthew Garner; new patient to me. H/o DM, HTN, spinal stenosis, and GERD. He's followed by doctor in [place] for knee pain. He had surgery in 2003 for spinal stenosis.   DM Lab Results  Component Value Date   HGBA1C 6.5 03/29/2015   Lab Results  Component Value Date   MICROALBUR 2.5 (H) 08/03/2014   He's instructed to take Metformin 574m bid.   He's been taking Metformin 5031mqd. He's been losing weight too by stopped eating fast foods. He's been eating mostly home-cooked meals. He denies having a glucometer or any more glucose Garner test strips.   Plans on obtaining flu shot at work.  Wt Readings from Last 3 Encounters:  09/15/15 244 lb (110.7 kg)  05/26/15 252 lb (114.3 kg)  03/29/15 254 lb 3.2 oz (115.3 kg)   Lab Results  Component Value Date   CHOL 199 12/21/2014   HDL 55 12/21/2014   LDLCALC 124 12/21/2014   TRIG 102 12/21/2014   CHOLHDL 3.6 12/21/2014   Lab Results  Component Value Date   ALT 21 08/03/2014   AST 15 08/03/2014   ALKPHOS 77 08/03/2014   BILITOT 0.4 08/03/2014    HTN Lab Results  Component Value Date   CREATININE 0.92 03/29/2015   He takes losartan 5068md. He denies shortness of breath, chest pain, chest tightness, lightheadedness or dizziness.   GERD He takes zantac 300m27m for GERD.   Patient Active Problem List   Diagnosis Date Noted  . Knee pain, bilateral 03/29/2015  . Nonspecific abnormal electrocardiogram (ECG) (EKG)  04/21/2014  . Spinal stenosis of lumbar region 10/27/2013  . Pain of left calf 06/16/2013  . Diabetes (HCC)[place]/26/2013  . Hyperlipidemia 11/27/2011  . Hypertension 11/27/2011   Past Medical History:  Diagnosis Date  . Anemia   . Diabetes mellitus   . Environmental allergies 11/27/2011  . GERD (gastroesophageal reflux disease)   . Hyperlipidemia   . Hypertension   . Sebaceous cyst 11/10/2012  . Snores    Past Surgical History:  Procedure Laterality Date  . APPENDECTOMY    . MASS EXCISION Bilateral 11/24/2012   Procedure: EXCISION SCALP MASS X2;  Surgeon: Matthew Garner;  [place];  Laterality: Bilateral;  . SPINE SURGERY  2004   lumb   No Known Allergies Prior to Admission medications   Medication Sig Start Date End Date Taking? Authorizing Provider  losartan (COZAAR) 50 MG tablet Take 1 tablet (50 mg total) by mouth every morning. 05/10/15  Yes Matthew Garner  metFORMIN (GLUCOPHAGE) 500 MG tablet Take 1 tablet (500 mg total) by mouth 2 (two) times daily with a meal. 05/10/15  Yes Matthew Garner  ranitidine (ZANTAC) 300 MG tablet TAKE 1 TABLET BY MOUTH AT BEDTIME. 05/06/15  Yes Matthew Garner  Garner Glucose Monitoring Suppl (Garner GLUCOSE METER) kit Use  as instructed Patient not taking: Reported on 09/15/2015 02/03/13   Matthew Russian, MD  glucose Garner test strip To check Garner sugars once daily dx code 250.00 Patient not taking: Reported on 09/15/2015 02/03/13   Matthew Russian, MD  HYDROcodone-acetaminophen (NORCO/VICODIN) 5-325 MG tablet Take 1-2 tablets by mouth every 4 (four) hours as needed. Patient not taking: Reported on 09/15/2015 03/21/15   Matthew Chimes, Matthew Garner  Lancets MISC To check Garner sugars once daily, dx code 250.00 Patient not taking: Reported on 09/15/2015 02/03/13   Matthew Russian, MD   Social History   Social History  . Marital status: Married    Spouse name: N/A  . Number of children: N/A  . Years of  education: N/A   Occupational History  . Not on file.   Social History Main Topics  . Smoking status: Never Smoker  . Smokeless tobacco: Never Used  . Alcohol use No  . Drug use: No  . Sexual activity: Not on file   Other Topics Concern  . Not on file   Social History Narrative  . No narrative on file   Review of Systems  Constitutional: Negative for fatigue and unexpected weight change.  Eyes: Negative for visual disturbance.  Respiratory: Negative for cough, chest tightness and shortness of breath.   Cardiovascular: Negative for chest pain, palpitations and leg swelling.  Gastrointestinal: Negative for abdominal pain and Garner in stool.  Musculoskeletal: Positive for arthralgias and back pain.  Neurological: Negative for dizziness, light-headedness and headaches.       Objective:   Physical Exam  Constitutional: He is oriented to person, place, and time. He appears well-developed and well-nourished.  HENT:  Head: Normocephalic and atraumatic.  Eyes: EOM are normal. Pupils are equal, round, and reactive to light.  Neck: No JVD present. Carotid bruit is not present.  Cardiovascular: Normal rate, regular rhythm and normal heart sounds.   No murmur heard. Pulmonary/Chest: Effort normal and breath sounds normal. He has no rales.  Musculoskeletal: He exhibits no edema.  Neurological: He is alert and oriented to person, place, and time.  Skin: Skin is warm and dry.  Psychiatric: He has a normal mood and affect.  Vitals reviewed.   Vitals:   09/15/15 1408  BP: 126/82  Pulse: 74  Resp: 18  Temp: 98.3 F (36.8 C)  TempSrc: Oral  SpO2: 97%  Weight: 244 lb (110.7 kg)  Height: 5' 4.5" (1.638 m)      Assessment & Plan:   Matthew Garner is a 55 y.o. male Screening for hyperlipidemia - Plan: COMPLETE METABOLIC PANEL WITH GFR, Lipid panel  - labs pending. Plan on looking at 10 year ascvd risk. likley will need some statin.   Essential hypertension - Plan: losartan  (COZAAR) 50 MG tablet  - stable. No changes.   Diabetes mellitus without complication ([place]) - Plan: Hemoglobin A1C, metFORMIN (GLUCOPHAGE) 500 MG tablet  -check A1c. Continue metformin 547m qd.   Back pain, chronic  - longstanding issue by report. Plan to follow up to discuss prior treatment, determine if change in sx's or possible repeat imaging, and Tx plan.   Meds ordered this encounter  Medications  . losartan (COZAAR) 50 MG tablet    Sig: Take 1 tablet (50 mg total) by mouth every morning.    Dispense:  90 tablet    Refill:  1  . metFORMIN (GLUCOPHAGE) 500 MG tablet    Sig: Take 1 tablet (500 mg total) by mouth daily  with breakfast.    Dispense:  90 tablet    Refill:  1  . Garner glucose meter kit and supplies KIT    Sig: Dispense based on patient and insurance preference. Use up to four times daily as directed. (FOR ICD-9 250.00, 250.01). Check Garner sugar once per day.    Dispense:  1 each    Refill:  0    Order Specific Question:   Number of strips    Answer:   30    Order Specific Question:   Number of lancets    Answer:   30  . Lancets MISC    Sig: To check Garner sugars once daily, dx code 250.00    Dispense:  100 each    Refill:  6    One touch ultra  . glucose Garner test strip    Sig: To check Garner sugars once daily dx code 250.00    Dispense:  100 each    Refill:  6    One touch ultra   Patient Instructions   I change the medications so it should read once per day for the metformin. No changes in medicines for now. Great work on the weight loss, as this should help your diabetes as well as other components of your health  . Follow-up in the next few weeks to discuss your back pain further so we can look at what has been done before, decide if new imaging needed, and referral to either specialist or spinal injection.  Return to the clinic or go to the nearest emergency room if any of your symptoms worsen or new symptoms occur.     IF you received an x-Garner  today, you will receive an invoice from [facility]. Please contact [facility] at [phone] with questions or concerns regarding your invoice.   IF you received labwork today, you will receive an invoice from [employer]. Please contact [facility] at [phone] with questions or concerns regarding your invoice.   Our billing staff will not be able to assist you with questions regarding bills from these companies.  You will be contacted with the lab results as soon as they are available. The fastest way to get your results is to activate your My Chart account. Instructions are located on the last page of this paperwork. If you have not heard from [name] regarding the results in 2 weeks, please contact this office.       I personally performed the services described in this documentation, which was scribed in my presence. The recorded information has been reviewed and considered, and addended by me as needed.   Signed,   Matthew Ray, MD [facility].  09/17/15 3:23 PM

## 2015-09-15 NOTE — Patient Instructions (Addendum)
I change the medications so it should read once per day for the metformin. No changes in medicines for now. Great work on the weight loss, as this should help your diabetes as well as other components of your health  . Follow-up in the next few weeks to discuss your back pain further so we can look at what has been done before, decide if new imaging needed, and referral to either specialist or spinal injection.  Return to the clinic or go to the nearest emergency room if any of your symptoms worsen or new symptoms occur.     IF you received an x-ray today, you will receive an invoice from [facility]. Please contact [facility] at [phone] with questions or concerns regarding your invoice.   IF you received labwork today, you will receive an invoice from [employer]. Please contact [facility] at [phone] with questions or concerns regarding your invoice.   Our billing staff will not be able to assist you with questions regarding bills from these companies.  You will be contacted with the lab results as soon as they are available. The fastest way to get your results is to activate your My Chart account. Instructions are located on the last page of this paperwork. If you have not heard from [name] regarding the results in 2 weeks, please contact this office.

## 2015-09-28 ENCOUNTER — Telehealth: Payer: Self-pay | Admitting: *Deleted

## 2015-09-28 NOTE — Telephone Encounter (Signed)
Can you review labs from 09/15/15.  Patient will be back by tomorrow to discuss.

## 2015-09-29 ENCOUNTER — Encounter: Payer: Self-pay | Admitting: *Deleted

## 2015-10-04 ENCOUNTER — Other Ambulatory Visit: Payer: Self-pay | Admitting: Orthopedic Surgery

## 2015-10-04 DIAGNOSIS — G8929 Other chronic pain: Secondary | ICD-10-CM

## 2015-10-04 DIAGNOSIS — M545 Low back pain: Principal | ICD-10-CM

## 2015-10-14 ENCOUNTER — Other Ambulatory Visit: Payer: Self-pay | Admitting: Orthopedic Surgery

## 2015-10-14 ENCOUNTER — Ambulatory Visit
Admission: RE | Admit: 2015-10-14 | Discharge: 2015-10-14 | Disposition: A | Discharge status: Home / Self Care | Payer: 59 | Source: Ambulatory Visit | Attending: Orthopedic Surgery | Admitting: Orthopedic Surgery

## 2015-10-14 DIAGNOSIS — G8929 Other chronic pain: Secondary | ICD-10-CM

## 2015-10-14 DIAGNOSIS — M545 Low back pain: Principal | ICD-10-CM

## 2015-10-14 MED ORDER — METHYLPREDNISOLONE ACETATE 40 MG/ML INJ SUSP (RADIOLOG: 120.0000 mg | Freq: Once | INTRAMUSCULAR | Status: DC

## 2015-10-14 MED ORDER — IOPAMIDOL (ISOVUE-M 200) INJECTION 41%: 1.0000 mL | Freq: Once | INTRAMUSCULAR | Status: DC

## 2015-10-14 NOTE — Discharge Instructions (Signed)

## 2015-10-18 ENCOUNTER — Emergency Department (HOSPITAL_COMMUNITY): Payer: 59

## 2015-10-18 ENCOUNTER — Encounter (HOSPITAL_COMMUNITY): Payer: Self-pay | Admitting: *Deleted

## 2015-10-18 ENCOUNTER — Emergency Department (HOSPITAL_COMMUNITY)
Admission: EM | Admit: 2015-10-18 | Discharge: 2015-10-18 | Disposition: A | Discharge status: Home / Self Care | Payer: 59 | Attending: Emergency Medicine | Admitting: Emergency Medicine

## 2015-10-18 DIAGNOSIS — Z7984 Long term (current) use of oral hypoglycemic drugs: Secondary | ICD-10-CM | POA: Diagnosis not present

## 2015-10-18 DIAGNOSIS — Z79899 Other long term (current) drug therapy: Secondary | ICD-10-CM | POA: Diagnosis not present

## 2015-10-18 DIAGNOSIS — J181 Lobar pneumonia, unspecified organism: Secondary | ICD-10-CM | POA: Diagnosis not present

## 2015-10-18 DIAGNOSIS — E119 Type 2 diabetes mellitus without complications: Secondary | ICD-10-CM | POA: Insufficient documentation

## 2015-10-18 DIAGNOSIS — R05 Cough: Secondary | ICD-10-CM | POA: Diagnosis present

## 2015-10-18 DIAGNOSIS — I10 Essential (primary) hypertension: Secondary | ICD-10-CM | POA: Diagnosis not present

## 2015-10-18 DIAGNOSIS — J189 Pneumonia, unspecified organism: Secondary | ICD-10-CM

## 2015-10-18 LAB — RAPID STREP SCREEN (MED CTR MEBANE ONLY): Streptococcus, Group A Screen (Direct): NEGATIVE

## 2015-10-18 MED ORDER — GUAIFENESIN ER 1200 MG PO TB12: 1.0000 | ORAL_TABLET | Freq: Two times a day (BID) | ORAL | 0 refills | Status: DC

## 2015-10-18 MED ORDER — DOXYCYCLINE HYCLATE 100 MG PO CAPS: 100.0000 mg | ORAL_CAPSULE | Freq: Two times a day (BID) | ORAL | 0 refills | Status: DC

## 2015-10-18 MED ORDER — PROMETHAZINE-DM 6.25-15 MG/5ML PO SYRP: 5.0000 mL | ORAL_SOLUTION | Freq: Four times a day (QID) | ORAL | 0 refills | Status: DC | PRN

## 2015-10-18 NOTE — ED Provider Notes (Signed)
[facility] DEPT Provider Note   CSN: [id number] Arrival date & time: 10/18/15  0441     History   Chief Complaint Chief Complaint  Patient presents with  . URI    HPI Matthew Garner is a 55 y.o. male.  HPI Patient presents to the emergency department with a 2 day history of cough, nasal congestion, sore throat.  The patient states that he has had chills as well.  The patient states he did not take any medications prior to arrival.  Patient states nothing seems to make his condition, better or worseThe patient denies chest pain, shortness of breath, headache,blurred vision, neck pain,, weakness, numbness, dizziness, anorexia, edema, abdominal pain, nausea, vomiting, diarrhea, rash, back pain, dysuria, hematemesis, bloody stool, near syncope, or syncope. Past Medical History:  Diagnosis Date  . Anemia   . Diabetes mellitus   . Environmental allergies 11/27/2011  . GERD (gastroesophageal reflux disease)   . Hyperlipidemia   . Hypertension   . Sebaceous cyst 11/10/2012  . Snores     Patient Active Problem List   Diagnosis Date Noted  . Knee pain, bilateral 03/29/2015  . Nonspecific abnormal electrocardiogram (ECG) (EKG) 04/21/2014  . Spinal stenosis of lumbar region 10/27/2013  . Pain of left calf 06/16/2013  . Diabetes ([place]) 11/27/2011  . Hyperlipidemia 11/27/2011  . Hypertension 11/27/2011    Past Surgical History:  Procedure Laterality Date  . APPENDECTOMY    . MASS EXCISION Bilateral 11/24/2012   Procedure: EXCISION SCALP MASS X2;  Surgeon: [name], MD;  Location: [place];  Service: General;  Laterality: Bilateral;  . SPINE SURGERY  2004   lumb       Home Medications    Prior to Admission medications   Medication Sig Start Date End Date Taking? Authorizing Provider  blood glucose meter kit and supplies KIT Dispense based on patient and insurance preference. Use up to four times daily as directed. (FOR ICD-9 250.00, 250.01).  Check blood sugar once per day. 09/15/15   Wendie Agreste, MD  Blood Glucose Monitoring Suppl (BLOOD GLUCOSE METER) kit Use as instructed Patient not taking: Reported on 09/15/2015 02/03/13   [name], MD  glucose blood test strip To check blood sugars once daily dx code 250.00 09/15/15   Wendie Agreste, MD  HYDROcodone-acetaminophen (NORCO/VICODIN) 5-325 MG tablet Take 1-2 tablets by mouth every 4 (four) hours as needed. Patient not taking: Reported on 09/15/2015 03/21/15   Marella Chimes, PA-C  Lancets MISC To check blood sugars once daily, dx code 250.00 09/15/15   Wendie Agreste, MD  losartan (COZAAR) 50 MG tablet Take 1 tablet (50 mg total) by mouth every morning. 09/15/15   Wendie Agreste, MD  metFORMIN (GLUCOPHAGE) 500 MG tablet Take 1 tablet (500 mg total) by mouth daily with breakfast. 09/15/15   Wendie Agreste, MD  ranitidine (ZANTAC) 300 MG tablet TAKE 1 TABLET BY MOUTH AT BEDTIME. 05/06/15   [name], MD    Family History No family history on file.  Social History Social History  Substance Use Topics  . Smoking status: Never Smoker  . Smokeless tobacco: Never Used  . Alcohol use No     Allergies   Review of patient's allergies indicates no known allergies.   Review of Systems Review of Systems  All other systems negative except as documented in the HPI. All pertinent positives and negatives as reviewed in the HPI. Physical Exam Updated Vital Signs BP 133/83 (  BP Location: Left Arm)   Pulse 63   Temp 97.6 F (36.4 C) (Oral)   Resp 20   SpO2 98%   Physical Exam  Constitutional: He is oriented to person, place, and time. He appears well-developed and well-nourished. No distress.  HENT:  Head: Normocephalic and atraumatic.  Mouth/Throat: Oropharynx is clear and moist.  Eyes: Pupils are equal, round, and reactive to light.  Neck: Normal range of motion. Neck supple.  Cardiovascular: Normal rate, regular rhythm and normal heart sounds.  Exam reveals  no gallop and no friction rub.   No murmur heard. Pulmonary/Chest: Effort normal and breath sounds normal. No respiratory distress. He has no wheezes.  Neurological: He is alert and oriented to person, place, and time. He exhibits normal muscle tone. Coordination normal.  Skin: Skin is warm and dry. No rash noted. No erythema.  Psychiatric: He has a normal mood and affect. His behavior is normal.  Nursing note and vitals reviewed.    ED Treatments / Results  Labs (all labs ordered are listed, but only abnormal results are displayed) Labs Reviewed  RAPID STREP SCREEN (NOT AT [facility])  CULTURE, GROUP A STREP [facility])    EKG  EKG Interpretation None       Radiology Dg Chest 2 View  Result Date: 10/18/2015 CLINICAL DATA:  Cough. EXAM: CHEST  2 VIEW COMPARISON:  04/21/2014. FINDINGS: Mediastinum and hilar structures are normal. Heart size stable. Low lung volumes. Mild left lower lobe infiltrate consistent pneumonia. No pleural effusion or pneumothorax . IMPRESSION: Mild left lower lobe infiltrate consistent with pneumonia. Electronically Signed   By: Marcello Moores  Register   On: 10/18/2015 07:46    Procedures Procedures (including critical care time)  Medications Ordered in ED Medications - No data to display   Initial Impression / Assessment and Plan / ED Course  I have reviewed the triage vital signs and the nursing notes.  Pertinent labs & imaging results that were available during my care of the patient were reviewed by me and considered in my medical decision making (see chart for details).  Clinical Course    Patient be treated for community-acquired pneumonia told to follow with his primary care doctor.  Patient agrees the plan and all questions were answered.  The patient has maintained normal oxygen saturations and his vital signs remained stable.  Told to return here for any worsening in his condition  Final Clinical Impressions(s) / ED Diagnoses   Final diagnoses:    None    New Prescriptions New Prescriptions   No medications on file     Dalia Heading, PA-C 10/18/15 [place], MD 10/18/15 1650

## 2015-10-18 NOTE — ED Triage Notes (Signed)
Pt states that he started with cough and congestion yesterday that has gotten progressively worse; denies difficulty breathing

## 2015-10-18 NOTE — Discharge Instructions (Signed)
Return here as needed. Follow up with your doctor. Increase your fluid intake. °

## 2015-10-19 ENCOUNTER — Other Ambulatory Visit: Payer: Self-pay | Admitting: Emergency Medicine

## 2015-10-19 DIAGNOSIS — K219 Gastro-esophageal reflux disease without esophagitis: Secondary | ICD-10-CM

## 2015-10-20 LAB — CULTURE, GROUP A STREP (THRC)

## 2015-10-25 ENCOUNTER — Other Ambulatory Visit: Payer: Self-pay

## 2015-10-25 DIAGNOSIS — K219 Gastro-esophageal reflux disease without esophagitis: Secondary | ICD-10-CM

## 2015-10-25 MED ORDER — RANITIDINE HCL 300 MG PO TABS: 300.0000 mg | ORAL_TABLET | Freq: Every day | ORAL | 0 refills | Status: DC

## 2015-12-22 ENCOUNTER — Telehealth: Payer: Self-pay | Admitting: Family Medicine

## 2015-12-22 ENCOUNTER — Encounter: Payer: Self-pay | Admitting: Family Medicine

## 2015-12-22 ENCOUNTER — Ambulatory Visit (INDEPENDENT_AMBULATORY_CARE_PROVIDER_SITE_OTHER): Payer: 59 | Admitting: Family Medicine

## 2015-12-22 VITALS — BP 126/72 | HR 69 | Temp 98.8°F | Resp 16 | Ht 64.0 in | Wt 246.0 lb

## 2015-12-22 DIAGNOSIS — M545 Low back pain, unspecified: Secondary | ICD-10-CM

## 2015-12-22 DIAGNOSIS — I10 Essential (primary) hypertension: Secondary | ICD-10-CM

## 2015-12-22 DIAGNOSIS — E119 Type 2 diabetes mellitus without complications: Secondary | ICD-10-CM

## 2015-12-22 MED ORDER — LOSARTAN POTASSIUM 50 MG PO TABS: 50.0000 mg | ORAL_TABLET | Freq: Every morning | ORAL | 1 refills | Status: DC

## 2015-12-22 MED ORDER — METFORMIN HCL 500 MG PO TABS: 500.0000 mg | ORAL_TABLET | Freq: Every day | ORAL | 1 refills | Status: DC

## 2015-12-22 NOTE — Telephone Encounter (Signed)
lmom about Bad debt of $414.18 and a balance of $90.66 all which have to be paid before appt   Im sorry don't know why he was still being seen and I myself or anyone else hadn't disscus this matter his appointment is with DR Nyoka Cowden today at 3:30

## 2015-12-22 NOTE — Patient Instructions (Addendum)
Please clarify with your orthopedic doctor about physical therapy, and where he wanted you to have that performed. If you need in office in town, here are a few different facilities: [facility] [address] [place], [place], [place] [address]  [phone]  [place], [place], [place] [address]  [phone]  Walking or other low intensity exercise ( as tolerated with your back and knee issues) may help with weight loss and diabetes.   No change in diabetes medicines for now.  IF you received an x-ray today, you will receive an invoice from [facility]. Please contact [facility] at [phone] with questions or concerns regarding your invoice.   IF you received labwork today, you will receive an invoice from [place]. Please contact [facility] at [phone] with questions or concerns regarding your invoice.   Our billing staff will not be able to assist you with questions regarding bills from these companies.  You will be contacted with the lab results as soon as they are available. The fastest way to get your results is to activate your My Chart account. Instructions are located on the last page of this paperwork. If you have not heard from [name] regarding the results in 2 weeks, please contact this office.

## 2015-12-22 NOTE — Progress Notes (Signed)
By signing my name below, I, Mesha Guinyard, attest that this documentation has been prepared under the direction and in the presence of [name], MD.  Electronically Signed: Verlee [place], Medical Scribe. 12/22/15. 4:13 PM.  Subjective:    Patient ID: Matthew Garner, male    DOB: [birth year], 55 y.o.   MRN: [id number]  HPI Chief Complaint  Patient presents with  . Diabetes    HPI Comments: Matthew Garner is a 55 y.o. male who presents to the [facility] for DM follow-up. Last seen 9/14. Hx of HTN, DM, spinal stenosis, and GERD. Followed by ortho in [facility] for knee.  DM: On metformin 500 mg at last visit, and had been loosing weight by change in diet. Remained on same metformin dose. Compliant with metformin. Pt is not exercising, but he has been watching his diet. Denies experiencing any negative side effects while on metformin such as chest pain, trouble breathing, SOB, melena, dizziness, light-headedness, abdominal pain, and other acute sxs. Dentist: Followed by dentist with biannual appts. Vision: Followed and has annual appts. Plans on seeing an ophthalmologist 01/2016. Lab Results  Component Value Date   HGBA1C 6.2 (H) 09/15/2015   Lab Results  Component Value Date   MICROALBUR 2.5 (H) 08/03/2014   Wt Readings from Last 3 Encounters:  12/22/15 246 lb (111.6 kg)  09/15/15 244 lb (110.7 kg)  05/26/15 252 lb (114.3 kg)   HLD: Stable at last visit; continued to monitor with diet. Lab Results  Component Value Date   CHOL 190 09/15/2015   HDL 64 09/15/2015   LDLCALC 113 09/15/2015   TRIG 67 09/15/2015   CHOLHDL 3.0 09/15/2015   Lab Results  Component Value Date   ALT 15 09/15/2015   AST 15 09/15/2015   ALKPHOS 66 09/15/2015   BILITOT 0.5 09/15/2015   HTN: On losartan 50 mg QD. Stable at last visit. Lab Results  Component Value Date   CREATININE 0.76 09/15/2015   Spinal Stenosis: Followed by Dr. Redmond Pulling at [facility]- last seen  yesterday. Planned on PT for spondylosis of his back and he had cortisone injection in his right knee with plan for 3 month follow-up. States the injection gave relief to his pain. 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 Reports he wasn't told where his PT would be.  Patient Active Problem List   Diagnosis Date Noted  . Knee pain, bilateral 03/29/2015  . Nonspecific abnormal electrocardiogram (ECG) (EKG) 04/21/2014  . Spinal stenosis of lumbar region 10/27/2013  . Pain of left calf 06/16/2013  . Diabetes ([place]) 11/27/2011  . Hyperlipidemia 11/27/2011  . Hypertension 11/27/2011   Past Medical History:  Diagnosis Date  . Anemia   . Diabetes mellitus   . Environmental allergies 11/27/2011  . GERD (gastroesophageal reflux disease)   . Hyperlipidemia   . Hypertension   . Sebaceous cyst 11/10/2012  . Snores    Past Surgical History:  Procedure Laterality Date  . APPENDECTOMY    . MASS EXCISION Bilateral 11/24/2012   Procedure: EXCISION SCALP MASS X2;  Surgeon: [name], MD;  Location: [place];  Service: General;  Laterality: Bilateral;  . SPINE SURGERY  2004   lumb   No Known Allergies Prior to Admission medications   Medication Sig Start Date End Date Taking? Authorizing Provider  losartan (COZAAR) 50 MG tablet Take 1 tablet (50 mg total) by mouth  every morning. 09/15/15  Yes Wendie Agreste, MD  metFORMIN (GLUCOPHAGE) 500 MG tablet Take 1 tablet (500 mg total) by mouth daily with breakfast. 09/15/15  Yes Wendie Agreste, MD  ranitidine (ZANTAC) 300 MG tablet Take 1 tablet (300 mg total) by mouth at bedtime. 10/25/15  Yes Wardell Honour, MD  blood glucose meter kit and supplies KIT Dispense based on patient and insurance preference. Use up to four times daily as directed. (FOR ICD-9 250.00, 250.01). Check blood sugar once per day. Patient not taking: Reported on 12/22/2015 09/15/15   Wendie Agreste, MD  Blood  Glucose Monitoring Suppl (BLOOD GLUCOSE METER) kit Use as instructed Patient not taking: Reported on 12/22/2015 02/03/13   [name], MD  glucose blood test strip To check blood sugars once daily dx code 250.00 Patient not taking: Reported on 12/22/2015 09/15/15   Wendie Agreste, MD  Guaifenesin 1200 MG TB12 Take 1 tablet (1,200 mg total) by mouth 2 (two) times daily. Patient not taking: Reported on 12/22/2015 10/18/15   Dalia Heading, PA-C  HYDROcodone-acetaminophen (NORCO/VICODIN) 5-325 MG tablet Take 1-2 tablets by mouth every 4 (four) hours as needed. Patient not taking: Reported on 12/22/2015 03/21/15   Marella Chimes, PA-C  Lancets MISC To check blood sugars once daily, dx code 250.00 Patient not taking: Reported on 12/22/2015 09/15/15   Wendie Agreste, MD   Social History   Social History  . Marital status: Married    Spouse name: N/A  . Number of children: N/A  . Years of education: N/A   Occupational History  . Not on file.   Social History Main Topics  . Smoking status: Never Smoker  . Smokeless tobacco: Never Used  . Alcohol use No  . Drug use: No  . Sexual activity: Not on file   Other Topics Concern  . Not on file   Social History Narrative  . No narrative on file   Review of Systems  Constitutional: Negative for unexpected weight change.  Cardiovascular: Negative for chest pain.  Gastrointestinal: Negative for abdominal pain and blood in stool.  Musculoskeletal: Positive for arthralgias and back pain.  Neurological: Negative for dizziness and light-headedness.   Objective:  Physical Exam  Constitutional: He appears well-developed and well-nourished. No distress.  HENT:  Head: Normocephalic and atraumatic.  Eyes: Conjunctivae are normal.  Neck: Neck supple.  Cardiovascular: Normal rate, regular rhythm and normal heart sounds.  Exam reveals no gallop and no friction rub.   No murmur heard. Pulmonary/Chest: Effort normal and breath sounds  normal. No respiratory distress. He has no wheezes. He has no rales.  Musculoskeletal:  lower lumbar tenderness Negative seated straight leg raise  Neurological: He is alert.  Skin: Skin is warm and dry.  Psychiatric: He has a normal mood and affect. His behavior is normal.  Nursing note and vitals reviewed.  BP 126/72   Pulse 69   Temp 98.8 F (37.1 C) (Oral)   Resp 16   Ht '5\' 4"'  (1.626 m)   Wt 246 lb (111.6 kg)   SpO2 96%   BMI 42.23 kg/m  Assessment & Plan:   Matthew Garner is a 55 y.o. male Type 2 diabetes mellitus without complication, without long-term current use of insulin ([place]) - Plan: metFORMIN (GLUCOPHAGE) 500 MG tablet, Hemoglobin A1C  -check A1c, continue metformin same dose for now, increase exercise.  Low back pain without sciatica, unspecified back pain laterality, unspecified chronicity  - continue follow up with  ortho, local PT facilities provided to take his Rx/order from ortho.   Essential hypertension - Plan: losartan (COZAAR) 50 MG tablet  - stable, continue losartan same dose.    Meds ordered this encounter  Medications  . losartan (COZAAR) 50 MG tablet    Sig: Take 1 tablet (50 mg total) by mouth every morning.    Dispense:  90 tablet    Refill:  1  . metFORMIN (GLUCOPHAGE) 500 MG tablet    Sig: Take 1 tablet (500 mg total) by mouth daily with breakfast.    Dispense:  90 tablet    Refill:  1   Patient Instructions   Please clarify with your orthopedic doctor about physical therapy, and where he wanted you to have that performed. If you need in office in town, here are a few different facilities: [facility] [address] [place], [place], [place] [address]  [phone]  [place], [place], [place] [address]  [phone]  Walking or other low intensity exercise ( as tolerated with your back and knee issues) may help with weight loss and diabetes.   No change in diabetes medicines for  now.  IF you received an x-ray today, you will receive an invoice from [facility]. Please contact [facility] at [phone] with questions or concerns regarding your invoice.   IF you received labwork today, you will receive an invoice from [place]. Please contact [facility] at [phone] with questions or concerns regarding your invoice.   Our billing staff will not be able to assist you with questions regarding bills from these companies.  You will be contacted with the lab results as soon as they are available. The fastest way to get your results is to activate your My Chart account. Instructions are located on the last page of this paperwork. If you have not heard from [name] regarding the results in 2 weeks, please contact this office.       I personally performed the services described in this documentation, which was scribed in my presence. The recorded information has been reviewed and considered, and addended by me as needed.   Signed,   [name], MD [facility].  12/27/15 9:35 AM

## 2015-12-23 LAB — HEMOGLOBIN A1C
Est. average glucose Bld gHb Est-mCnc: 140 mg/dL
HEMOGLOBIN A1C: 6.5 % — AB (ref 4.8–5.6)

## 2015-12-29 ENCOUNTER — Ambulatory Visit: Payer: 59 | Admitting: Family Medicine

## 2016-01-03 ENCOUNTER — Encounter: Payer: Self-pay | Admitting: Emergency Medicine

## 2016-01-03 NOTE — Telephone Encounter (Signed)
Pt came into our office wanting a copy of labs gave him copy and explain results he has no questions

## 2016-01-10 ENCOUNTER — Ambulatory Visit: Payer: 59 | Attending: Orthopedic Surgery | Admitting: Physical Therapy

## 2016-01-10 ENCOUNTER — Encounter: Payer: Self-pay | Admitting: Physical Therapy

## 2016-01-10 DIAGNOSIS — M25561 Pain in right knee: Secondary | ICD-10-CM | POA: Insufficient documentation

## 2016-01-10 DIAGNOSIS — G8929 Other chronic pain: Secondary | ICD-10-CM | POA: Insufficient documentation

## 2016-01-10 DIAGNOSIS — M5442 Lumbago with sciatica, left side: Secondary | ICD-10-CM | POA: Diagnosis present

## 2016-01-10 DIAGNOSIS — R262 Difficulty in walking, not elsewhere classified: Secondary | ICD-10-CM | POA: Diagnosis present

## 2016-01-10 DIAGNOSIS — M25562 Pain in left knee: Secondary | ICD-10-CM | POA: Insufficient documentation

## 2016-01-10 DIAGNOSIS — M5441 Lumbago with sciatica, right side: Secondary | ICD-10-CM | POA: Insufficient documentation

## 2016-01-11 NOTE — Therapy (Signed)
[place], [place], [address] Phone: [phone]   Fax:  [phone]  Physical Therapy Treatment  Patient Details  Name: Matthew Garner MRN: [id number] Date of Birth: 27-Oct-1960 Referring Provider: Dr Magda Bernheim   Encounter Date: 01/10/2016      PT End of Session - 01/11/16 0934    Visit Number 1   Number of Visits 16   Date for PT Re-Evaluation 03/07/16   Authorization Type [employer]    PT Start Time [phone]   PT Stop Time 0520   PT Time Calculation (min) 46 min   Activity Tolerance Patient tolerated treatment well   Behavior During Therapy [facility] for tasks assessed/performed      Past Medical History:  Diagnosis Date  . Anemia   . Diabetes mellitus   . Environmental allergies 11/27/2011  . GERD (gastroesophageal reflux disease)   . Hyperlipidemia   . Hypertension   . Sebaceous cyst 11/10/2012  . Snores     Past Surgical History:  Procedure Laterality Date  . APPENDECTOMY    . MASS EXCISION Bilateral 11/24/2012   Procedure: EXCISION SCALP MASS X2;  Surgeon: [name], MD;  Location: [place];  Service: General;  Laterality: Bilateral;  . SPINE SURGERY  2004   lumb    There were no vitals filed for this visit.      Subjective Assessment - 01/10/16 1642    Subjective Patient has a history of lower back pain since 2003. He had lower back surgery in 2004. He continues to have pain. The MD reccomended more back surgery but the patient has declined. 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 He went back to lifting 50-70 pound boxes. He also has pain in bilateral knees both his knees are painful.    Pertinent History Bilateral knee arthirtis, DMII,    Limitations Lifting;Standing;Walking   How long can you stand comfortably? < 20 minutes before he has pain    How long can you walk comfortably? Limited community  distances boefre he has increased pain    Currently in Pain? Yes   Pain Score 8    Pain Location Back   Pain Orientation Right;Left   Pain Descriptors / Indicators Aching   Pain Type Chronic pain   Pain Onset More than a month ago   Pain Frequency Constant   Aggravating Factors  standing, lifting, walking, prolonged sitting    Pain Relieving Factors nothing at this time   Effect of Pain on Daily Activities difficulty perfroming work tasks    Multiple Pain Sites Yes   Pain Score 5   Pain Location Knee   Pain Orientation Right;Left   Pain Descriptors / Indicators Aching   Pain Type Acute pain   Pain Onset More than a month ago   Pain Frequency Intermittent   Aggravating Factors  standing,  walking    Pain Relieving Factors shots for a short period of time    Effect of Pain on Daily Activities difficulty perfroing work tasks             [facility] PT Assessment - 01/11/16 0001      Assessment   Medical Diagnosis Lumbar Spondylosis, , Bilateral knee pain    Referring Provider Dr Magda Bernheim    Onset Date/Surgical Date --  2003   Hand Dominance Right   Next MD Visit March  Prior Therapy several years the patient can not recall      Precautions   Precautions None   Precaution Comments '     Restrictions   Weight Bearing Restrictions No     Balance Screen   Has the patient fallen in the past 6 months No   Has the patient had a decrease in activity level because of a fear of falling?  No   Is the patient reluctant to leave their home because of a fear of falling?  No     Home Ecologist residence   Additional Comments No stairs leading into the house      Prior Function   Level of Independence Independent   Vocation Full time employment   Engineer, maintenance boxes at a warehouse    Leisure Nothing specified      Cognition   Overall Cognitive Status Within Functional Limits for tasks assessed   Attention Focused   Focused  Attention Appears intact   Memory Appears intact   Awareness Appears intact   Problem Solving Appears intact     Observation/Other Assessments   Observations Obesity, sits to the right;      Sensation   Additional Comments Denies parathesias      Coordination   Gross Motor Movements are Fluid and Coordinated Yes   Fine Motor Movements are Fluid and Coordinated Yes     Posture/Postural Control   Posture Comments Forward head, rounded shoulders      AROM   AROM Assessment Site Lumbar   Lumbar Flexion limited 25%    Lumbar Extension limited 50% with increased pain   Lumbar - Right Side Bend WNL    Lumbar - Left Side Bend WNL   Lumbar - Right Rotation WNL    Lumbar - Left Rotation WNL      PROM   Overall PROM Comments Passive hip flexion right 80 degrees 85 on the left; pain with end range knee flexion bilateral      Strength   Overall Strength Comments poor core contraction   Strength Assessment Site Hip;Knee   Right/Left Hip Right;Left   Right Hip Flexion 3+/5   Right Hip Extension --  unable to lie on his stomach    Right Hip ABduction 4/5   Right Hip ADduction 4/5   Left Hip Flexion 3/5   Left Hip ABduction 4/5   Left Hip ADduction 4/5     Flexibility   Soft Tissue Assessment /Muscle Length yes   Hamstrings l40 degree 90/90 limitation bilateral      Palpation   Spinal mobility difficult to assess patient with difficulty lying on her stomcah    Palpation comment Spasming of bilateral lumbar spine                      [facility] Adult PT Treatment/Exercise - 01/11/16 0001      Ambulation/Gait   Gait Comments limited hip rotation with gait, Bilateral hip abduction during swing phase.      Lumbar Exercises: Stretches   Active Hamstring Stretch Limitations seated hamstring stretch 2x20 seconds bilateral    Single Knee to Chest Stretch Limitations 2x20sec hold    Lower Trunk Rotation Limitations x10      Lumbar Exercises: Supine   Other Supine Lumbar  Exercises x10                 PT Education - 01/11/16 0933    Education provided  Yes   Education Details symptom mangement, HEP, Importance of increaing hip mobility    Person(s) Educated Patient   Methods Demonstration;Explanation   Comprehension Verbalized understanding;Returned demonstration;Verbal cues required;Tactile cues required          PT Short Term Goals - 01/11/16 0956      PT SHORT TERM GOAL #1   Title Patient will increase hip flexion bilateral to 100 degrees without pain    Time 4   Period Weeks   Status New     PT SHORT TERM GOAL #2   Title Patient will idemsotrate a good core contraction    Time 4   Period Weeks   Status New     PT SHORT TERM GOAL #3   Title Patient will be independent with HEP    Time 4   Period Weeks     PT SHORT TERM GOAL #4   Title Patient will increase gross bilateral hip and knee strength to 4+/5    Time 4   Period Weeks   Status New           PT Long Term Goals - 01/11/16 [id number]      PT LONG TERM GOAL #1   Title Patient will bend to pick item off the ground with proper technque and without pain in order to perfrom work tasks   Time 8   Period Weeks   Status New     PT LONG TERM GOAL #2   Title Patient will demsotrate a 52% limitation on FOTO    Time 8   Period Weeks   Status New     PT LONG TERM GOAL #3   Title Patient will sit for 1 hour without pain in order to drive    Time 8   Period Weeks   Status New     PT LONG TERM GOAL #4   Title Patient will ambulate 1 mile without increased knee and back pain in order to perfrom ADL's    Time 8   Period Weeks   Status New               Plan - 01/11/16 0941    Clinical Impression Statement Patient is a 56 year old male with low back pain and pain in both knees. He has significant limitations in his mobility and strength. He has spasming of his lower back. He has increased pain with extension.  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  He has increased pain when he works and when he drives. He would benefit from skilled therapy to adress the above deficits. He was seen for a low complexity evaluation.     Rehab Potential Good   PT Frequency 2x / week   PT Duration 8 weeks   PT Treatment/Interventions ADLs/Self Care Home Management;Cryotherapy;Electrical Stimulation;Iontophoresis 4mg /ml Dexamethasone;Moist Heat;Traction;Ultrasound;Gait training;Stair training;Neuromuscular re-education;Therapeutic exercise;Therapeutic activities;Functional mobility training;Patient/family education;Manual techniques;Taping;Dry needling;Passive range of motion   PT Next Visit Plan continue hip stretching, consider manual therapy to the lumbar spine to decrease spasming and increase mobility, add supine hip flexion, abduction, and aduction, review lifting technique   PT Home Exercise Plan posterior pelvic tilt, lateral trunk rotation, single knee to chest stretch, hamstring stretch    Consulted and Agree with Plan of Care Patient      Patient will benefit from skilled therapeutic intervention in order to improve the following deficits and impairments:  Abnormal gait, Decreased range of motion, Difficulty  walking, Increased fascial restricitons, Increased muscle spasms, Decreased activity tolerance, Hypomobility, Pain, Decreased strength, Decreased mobility, Impaired sensation, Postural dysfunction  Visit Diagnosis: Acute midline low back pain with bilateral sciatica  Chronic pain of left knee  Acute pain of right knee  Difficulty in walking, not elsewhere classified     Problem List Patient Active Problem List   Diagnosis Date Noted  . Knee pain, bilateral 03/29/2015  . Nonspecific abnormal electrocardiogram (ECG) (EKG) 04/21/2014  . Spinal stenosis of lumbar region 10/27/2013  . Pain of left calf 06/16/2013  . Diabetes ([place]) 11/27/2011  . Hyperlipidemia 11/27/2011  . Hypertension 11/27/2011    Carney Living 01/11/2016, 12:53 PM  [facility] [address] [place], [place], [address] Phone: [phone]   Fax:  [phone]  Name: Matthew Garner MRN: [id number] Date of Birth: 10-20-1960

## 2016-01-19 ENCOUNTER — Ambulatory Visit: Payer: 59 | Admitting: Physical Therapy

## 2016-01-26 ENCOUNTER — Encounter: Payer: Self-pay | Admitting: Physical Therapy

## 2016-01-26 ENCOUNTER — Ambulatory Visit: Payer: 59 | Admitting: Physical Therapy

## 2016-01-26 DIAGNOSIS — M5441 Lumbago with sciatica, right side: Secondary | ICD-10-CM

## 2016-01-26 DIAGNOSIS — R262 Difficulty in walking, not elsewhere classified: Secondary | ICD-10-CM

## 2016-01-26 DIAGNOSIS — G8929 Other chronic pain: Secondary | ICD-10-CM

## 2016-01-26 DIAGNOSIS — M5442 Lumbago with sciatica, left side: Principal | ICD-10-CM

## 2016-01-26 DIAGNOSIS — M25561 Pain in right knee: Secondary | ICD-10-CM

## 2016-01-26 DIAGNOSIS — M25562 Pain in left knee: Secondary | ICD-10-CM

## 2016-01-26 NOTE — Therapy (Signed)
[place], [place], [address] Phone: [phone]   Fax:  [phone]  Physical Therapy Treatment  Patient Details  Name: Matthew Garner MRN: [id number] Date of Birth: Jul 13, 1960 Referring Provider: Dr Magda Bernheim   Encounter Date: 01/26/2016      PT End of Session - 01/26/16 1733    Visit Number 2   Number of Visits 16   Date for PT Re-Evaluation 03/07/16   PT Start Time 6962   PT Stop Time 1730   PT Time Calculation (min) 59 min   Activity Tolerance Patient tolerated treatment well   Behavior During Therapy [facility] for tasks assessed/performed      Past Medical History:  Diagnosis Date  . Anemia   . Diabetes mellitus   . Environmental allergies 11/27/2011  . GERD (gastroesophageal reflux disease)   . Hyperlipidemia   . Hypertension   . Sebaceous cyst 11/10/2012  . Snores     Past Surgical History:  Procedure Laterality Date  . APPENDECTOMY    . MASS EXCISION Bilateral 11/24/2012   Procedure: EXCISION SCALP MASS X2;  Surgeon: [name], MD;  Location: [place];  Service: General;  Laterality: Bilateral;  . SPINE SURGERY  2004   lumb    There were no vitals filed for this visit.      Subjective Assessment - 01/26/16 1632    Subjective pain is th  same as the first visit.  8/10 .  has been too busy to do exercises   Currently in Pain? Yes   Pain Score 8    Pain Location Back   Pain Orientation Right;Left;Mid   Pain Descriptors / Indicators Aching   Pain Type Chronic pain   Pain Frequency Constant   Aggravating Factors  driving, lifting, constant   Pain Relieving Factors nothing   Effect of Pain on Daily Activities unable to drive for a living   Pain Score 5   Pain Location Knee   Pain Orientation Right;Left   Pain Descriptors / Indicators Aching   Pain Type Acute pain   Pain Frequency Intermittent   Aggravating Factors  standing walking lifting   Pain  Relieving Factors jacuzzi briefly   Effect of Pain on Daily Activities difficult lifting walking            [facility] PT Assessment - 01/26/16 0001      PROM   Overall PROM Comments Passive hip flexion 90 + both limited by knee pain vs hip pain                     [facility] Adult PT Treatment/Exercise - 01/26/16 0001      Self-Care   Self-Care --  usr of heat /cold 15- 20 minutes,  not directly on skin     Lumbar Exercises: Stretches   Passive Hamstring Stretch 3 reps;30 seconds   Single Knee to Chest Stretch 3 reps;20 seconds  AA   Single Knee to Chest Stretch Limitations care taken to avoid knee pain   Pelvic Tilt Limitations 10   Piriformis Stretch 1 rep;20 seconds  PROM     Lumbar Exercises: Supine   Clam 10 reps  AROM,  No band yet   Clam Limitations cuse for technique, breath , slower  abdominal braced  with ball squeeze 10 X   Bent Knee Raise 10 reps     Moist Heat Therapy   Number Minutes Moist Heat 15 Minutes  Moist Heat Location Lumbar Spine  prone     Manual Therapy   Manual Therapy Soft tissue mobilization;Myofascial release   Manual therapy comments tissue more mobile low back post manual   Soft tissue mobilization low back instrument assist   Myofascial Release low back and lateral right thigh                PT Education - 01/26/16 1726    Education provided Yes   Education Details Where to get cold/hot pack   Person(s) Educated Patient   Methods Explanation;Handout   Comprehension Verbalized understanding          PT Short Term Goals - 01/26/16 1736      PT SHORT TERM GOAL #1   Title Patient will increase hip flexion bilateral to 100 degrees without pain    Baseline PROM 90+ degrees both   Time 4   Period Weeks   Status On-going     PT SHORT TERM GOAL #2   Title Patient will idemsotrate a good core contraction    Time 4   Period Weeks   Status On-going     PT SHORT TERM GOAL #3   Title Patient will be independent  with HEP    Baseline not yet compliant   Time 4   Period Weeks   Status On-going     PT SHORT TERM GOAL #4   Title Patient will increase gross bilateral hip and knee strength to 4+/5    Time 4   Period Weeks   Status Unable to assess           PT Long Term Goals - 01/11/16 1610      PT LONG TERM GOAL #1   Title Patient will bend to pick item off the ground with proper technque and without pain in order to perfrom work tasks   Time 8   Period Weeks   Status New     PT LONG TERM GOAL #2   Title Patient will demsotrate a 52% limitation on FOTO    Time 8   Period Weeks   Status New     PT LONG TERM GOAL #3   Title Patient will sit for 1 hour without pain in order to drive    Time 8   Period Weeks   Status New     PT LONG TERM GOAL #4   Title Patient will ambulate 1 mile without increased knee and back pain in order to perfrom ADL's    Time 8   Period Weeks   Status New               Plan - 01/26/16 1734    Clinical Impression Statement Patient had no back pain post session.  He is non compliant with his HEP.  He gets up at 3 am, works long hours and then sleeps.  No new exercises added today.  ano new goals met.  hje is interested in getting a heat pack at home.   (info given from online options)   PT Next Visit Plan continue hip stretching, continue manual therapy to the lumbar spine to decrease spasming and increase mobility, add supine hip flexion, abduction, and aduction, review lifting technique   PT Home Exercise Plan posterior pelvic tilt, lateral trunk rotation, single knee to chest stretch, hamstring stretch    Consulted and Agree with Plan of Care Patient      Patient will benefit from skilled therapeutic intervention in order  to improve the following deficits and impairments:  Abnormal gait, Decreased range of motion, Difficulty walking, Increased fascial restricitons, Increased muscle spasms, Decreased activity tolerance, Hypomobility, Pain,  Decreased strength, Decreased mobility, Impaired sensation, Postural dysfunction  Visit Diagnosis: Acute midline low back pain with bilateral sciatica  Chronic pain of left knee  Acute pain of right knee  Difficulty in walking, not elsewhere classified     Problem List Patient Active Problem List   Diagnosis Date Noted  . Knee pain, bilateral 03/29/2015  . Nonspecific abnormal electrocardiogram (ECG) (EKG) 04/21/2014  . Spinal stenosis of lumbar region 10/27/2013  . Pain of left calf 06/16/2013  . Diabetes ([place]) 11/27/2011  . Hyperlipidemia 11/27/2011  . Hypertension 11/27/2011    HARRIS,KAREN PTA 01/26/2016, 5:40 PM  [facility] [address] [place], [place], [address] Phone: [phone]   Fax:  [phone]  Name: Matthew Garner MRN: [id number] Date of Birth: Feb 20, 1960

## 2016-02-05 ENCOUNTER — Other Ambulatory Visit: Payer: Self-pay | Admitting: Family Medicine

## 2016-02-05 DIAGNOSIS — K219 Gastro-esophageal reflux disease without esophagitis: Secondary | ICD-10-CM

## 2016-02-08 ENCOUNTER — Ambulatory Visit: Payer: 59 | Attending: Orthopedic Surgery | Admitting: Physical Therapy

## 2016-02-08 ENCOUNTER — Encounter: Payer: Self-pay | Admitting: Physical Therapy

## 2016-02-08 DIAGNOSIS — M25561 Pain in right knee: Secondary | ICD-10-CM | POA: Insufficient documentation

## 2016-02-08 DIAGNOSIS — R262 Difficulty in walking, not elsewhere classified: Secondary | ICD-10-CM | POA: Diagnosis present

## 2016-02-08 DIAGNOSIS — M5442 Lumbago with sciatica, left side: Secondary | ICD-10-CM | POA: Insufficient documentation

## 2016-02-08 DIAGNOSIS — M25562 Pain in left knee: Secondary | ICD-10-CM | POA: Diagnosis not present

## 2016-02-08 DIAGNOSIS — G8929 Other chronic pain: Secondary | ICD-10-CM | POA: Insufficient documentation

## 2016-02-08 DIAGNOSIS — M5441 Lumbago with sciatica, right side: Secondary | ICD-10-CM | POA: Insufficient documentation

## 2016-02-14 ENCOUNTER — Ambulatory Visit: Payer: 59 | Admitting: Physical Therapy

## 2016-02-23 ENCOUNTER — Ambulatory Visit: Payer: 59 | Admitting: Physical Therapy

## 2016-02-23 ENCOUNTER — Encounter: Payer: Self-pay | Admitting: Physical Therapy

## 2016-02-23 DIAGNOSIS — M25561 Pain in right knee: Secondary | ICD-10-CM

## 2016-02-23 DIAGNOSIS — R262 Difficulty in walking, not elsewhere classified: Secondary | ICD-10-CM

## 2016-02-23 DIAGNOSIS — M5441 Lumbago with sciatica, right side: Secondary | ICD-10-CM

## 2016-02-23 DIAGNOSIS — M5442 Lumbago with sciatica, left side: Principal | ICD-10-CM

## 2016-02-23 DIAGNOSIS — M25562 Pain in left knee: Secondary | ICD-10-CM

## 2016-02-23 DIAGNOSIS — G8929 Other chronic pain: Secondary | ICD-10-CM

## 2016-02-23 NOTE — Therapy (Signed)
[place], [place], [address] Phone: [phone]   Fax:  [phone]  Physical Therapy Treatment  Patient Details  Name: Matthew Garner MRN: [id number] Date of Birth: 05-14-1960 Referring Provider: Dr Magda Bernheim   Encounter Date: 02/08/2016      PT End of Session - 02/23/16 1628    Visit Number 3   Number of Visits 16   Date for PT Re-Evaluation 03/07/16   Authorization Type [employer]    PT Start Time 1630   PT Stop Time 1714   PT Time Calculation (min) 44 min   Activity Tolerance Patient tolerated treatment well   Behavior During Therapy [facility] for tasks assessed/performed      Past Medical History:  Diagnosis Date  . Anemia   . Diabetes mellitus   . Environmental allergies 11/27/2011  . GERD (gastroesophageal reflux disease)   . Hyperlipidemia   . Hypertension   . Sebaceous cyst 11/10/2012  . Snores     Past Surgical History:  Procedure Laterality Date  . APPENDECTOMY    . MASS EXCISION Bilateral 11/24/2012   Procedure: EXCISION SCALP MASS X2;  Surgeon: [name], MD;  Location: [place];  Service: General;  Laterality: Bilateral;  . SPINE SURGERY  2004   lumb    There were no vitals filed for this visit.                                 PT Short Term Goals - 01/26/16 1736      PT SHORT TERM GOAL #1   Title Patient will increase hip flexion bilateral to 100 degrees without pain    Baseline PROM 90+ degrees both   Time 4   Period Weeks   Status On-going     PT SHORT TERM GOAL #2   Title Patient will idemsotrate a good core contraction    Time 4   Period Weeks   Status On-going     PT SHORT TERM GOAL #3   Title Patient will be independent with HEP    Baseline not yet compliant   Time 4   Period Weeks   Status On-going     PT SHORT TERM GOAL #4   Title Patient will increase gross bilateral hip and knee strength to  4+/5    Time 4   Period Weeks   Status Unable to assess           PT Long Term Goals - 01/11/16 [id number]      PT LONG TERM GOAL #1   Title Patient will bend to pick item off the ground with proper technque and without pain in order to perfrom work tasks   Time 8   Period Weeks   Status New     PT LONG TERM GOAL #2   Title Patient will demsotrate a 52% limitation on FOTO    Time 8   Period Weeks   Status New     PT LONG TERM GOAL #3   Title Patient will sit for 1 hour without pain in order to drive    Time 8   Period Weeks   Status New     PT LONG TERM GOAL #4   Title Patient will ambulate 1 mile without increased knee and back pain in order to perfrom ADL's    Time 8   Period Weeks  Status New               Plan - 02/23/16 1628    Clinical Impression Statement Patient contineus to have significant low back pain. He has had no improvements.    Rehab Potential Good   PT Frequency 2x / week   PT Duration 8 weeks   PT Treatment/Interventions ADLs/Self Care Home Management;Cryotherapy;Electrical Stimulation;Iontophoresis 4mg /ml Dexamethasone;Moist Heat;Traction;Ultrasound;Gait training;Stair training;Neuromuscular re-education;Therapeutic exercise;Therapeutic activities;Functional mobility training;Patient/family education;Manual techniques;Taping;Dry needling;Passive range of motion   PT Next Visit Plan continue hip stretching, continue manual therapy to the lumbar spine to decrease spasming and increase mobility, add supine hip flexion, abduction, and aduction, review lifting technique   PT Home Exercise Plan posterior pelvic tilt, lateral trunk rotation, single knee to chest stretch, hamstring stretch    Consulted and Agree with Plan of Care Patient      Patient will benefit from skilled therapeutic intervention in order to improve the following deficits and impairments:  Abnormal gait, Decreased range of motion, Difficulty walking, Increased fascial restricitons,  Increased muscle spasms, Decreased activity tolerance, Hypomobility, Pain, Decreased strength, Decreased mobility, Impaired sensation, Postural dysfunction  Visit Diagnosis: Acute midline low back pain with bilateral sciatica  Chronic pain of left knee  Acute pain of right knee  Difficulty in walking, not elsewhere classified     Problem List Patient Active Problem List   Diagnosis Date Noted  . Knee pain, bilateral 03/29/2015  . Nonspecific abnormal electrocardiogram (ECG) (EKG) 04/21/2014  . Spinal stenosis of lumbar region 10/27/2013  . Pain of left calf 06/16/2013  . Diabetes ([place]) 11/27/2011  . Hyperlipidemia 11/27/2011  . Hypertension 11/27/2011    Carney Living PT DPT  02/23/2016, 4:30 PM  [facility] [address] [place], [place], [address] Phone: [phone]   Fax:  [phone]  Name: Matthew Garner MRN: [id number] Date of Birth: [birth year]

## 2016-02-24 NOTE — Therapy (Signed)
[place], [place], [address] Phone: [phone]   Fax:  [phone]  Physical Therapy Treatment  Patient Details  Name: Matthew Garner MRN: [id number] Date of Birth: 1960-12-02 Referring Provider: Dr Magda Bernheim   Encounter Date: 02/23/2016      PT End of Session - 02/24/16 1021    Visit Number 4   Number of Visits 16   Date for PT Re-Evaluation 03/07/16   Authorization Type [employer]    PT Start Time 1630   PT Stop Time 1710   PT Time Calculation (min) 40 min   Activity Tolerance Patient tolerated treatment well   Behavior During Therapy [facility] for tasks assessed/performed      Past Medical History:  Diagnosis Date  . Anemia   . Diabetes mellitus   . Environmental allergies 11/27/2011  . GERD (gastroesophageal reflux disease)   . Hyperlipidemia   . Hypertension   . Sebaceous cyst 11/10/2012  . Snores     Past Surgical History:  Procedure Laterality Date  . APPENDECTOMY    . MASS EXCISION Bilateral 11/24/2012   Procedure: EXCISION SCALP MASS X2;  Surgeon: [name], MD;  Location: [facility];  Service: General;  Laterality: Bilateral;  . SPINE SURGERY  2004   lumb    There were no vitals filed for this visit.      Subjective Assessment - 02/23/16 1634    Subjective Patient reports that his back pain is about the same. He continues to have significant knee and back pain. He has increased pain when he works. He had some releif last visit with manual therapy but he has had no carryover. He has began going to the pool whch has helped.    Pertinent History Bilateral knee arthirtis, DMII,    Limitations Walking;Lifting;Standing   How long can you stand comfortably? < 20 minutes before he has pain    How long can you walk comfortably? Limited community distances boefre he has increased pain    Currently in Pain? Yes   Pain Score 9    Pain Location Back   Pain  Orientation Right;Left;Mid   Pain Descriptors / Indicators Aching   Pain Type Chronic pain   Pain Onset More than a month ago   Pain Frequency Constant   Aggravating Factors  driving, lifting    Pain Relieving Factors nothing    Effect of Pain on Daily Activities difficulty perfroming work tasks                          [facility] Adult PT Treatment/Exercise - 02/24/16 0001      Self-Care   Self-Care --  usr of heat /cold 15- 20 minutes,  not directly on skin     Lumbar Exercises: Stretches   Passive Hamstring Stretch 3 reps;30 seconds   Single Knee to Chest Stretch 3 reps;20 seconds  AA   Single Knee to Chest Stretch Limitations care taken to avoid knee pain   Pelvic Tilt Limitations 10   Piriformis Stretch 1 rep;20 seconds  PROM     Lumbar Exercises: Standing   Other Standing Lumbar Exercises reviewed standing hip flexion, extension, and abduction for the pool. Reviewed lifting technique for his knees and back.      Lumbar Exercises: Supine   Clam 10 reps  AROM,  No band yet   Clam Limitations cuse for technique, breath , slower  abdominal  braced  with ball squeeze 10 X   Bent Knee Raise 10 reps     Moist Heat Therapy   Moist Heat Location Lumbar Spine  prone     Manual Therapy   Manual Therapy Soft tissue mobilization;Myofascial release   Manual therapy comments tissue more mobile low back post manual   Soft tissue mobilization low back instrument assist   Myofascial Release low back and lateral right thigh                PT Education - 02/24/16 1021    Education provided Yes   Education Details improtance of building strength,    Person(s) Educated Patient   Methods Explanation;Handout   Comprehension Verbalized understanding          PT Short Term Goals - 02/24/16 1027      PT SHORT TERM GOAL #1   Title Patient will increase hip flexion bilateral to 100 degrees without pain    Baseline PROM 90+ degrees both   Time 4   Period  Weeks   Status Not Met     PT SHORT TERM GOAL #2   Title Patient will idemsotrate a good core contraction    Baseline continues to require cuing    Time 4   Period Weeks   Status On-going     PT SHORT TERM GOAL #3   Title Patient will be independent with HEP    Baseline not yet compliant   Time 4   Period Weeks   Status On-going     PT SHORT TERM GOAL #4   Title Patient will increase gross bilateral hip and knee strength to 4+/5    Time 4   Period Weeks   Status On-going           PT Long Term Goals - 01/11/16 4562      PT LONG TERM GOAL #1   Title Patient will bend to pick item off the ground with proper technque and without pain in order to perfrom work tasks   Time 8   Period Weeks   Status New     PT LONG TERM GOAL #2   Title Patient will demsotrate a 52% limitation on FOTO    Time 8   Period Weeks   Status New     PT LONG TERM GOAL #3   Title Patient will sit for 1 hour without pain in order to drive    Time 8   Period Weeks   Status New     PT LONG TERM GOAL #4   Title Patient will ambulate 1 mile without increased knee and back pain in order to perfrom ADL's    Time 8   Period Weeks   Status New               Plan - 02/24/16 1024    Clinical Impression Statement Patient will be discharge 2nd to lack of progress. He has only came to 3 visits since his initial evaluation. He is not feeling any benefit. Therapy advised his if her could come more consitently then we could focus on manual therapy to see if that would help. He feels with his work schedule he would be unable to do that. He was havingdifficulty with basic stretches today. He could not tolerate doing lower trunk rotations in a low range. He would benefit from a return to his doctor., He was given exercises to do in the pool to continue strengthening, He was  advised that strengthening can take a long time and to make sure to continue.    Rehab Potential Good   PT Frequency 2x / week    PT Duration 8 weeks   PT Treatment/Interventions ADLs/Self Care Home Management;Cryotherapy;Electrical Stimulation;Iontophoresis 61m/ml Dexamethasone;Moist Heat;Traction;Ultrasound;Gait training;Stair training;Neuromuscular re-education;Therapeutic exercise;Therapeutic activities;Functional mobility training;Patient/family education;Manual techniques;Taping;Dry needling;Passive range of motion   PT Next Visit Plan continue hip stretching, continue manual therapy to the lumbar spine to decrease spasming and increase mobility, add supine hip flexion, abduction, and aduction, review lifting technique   PT Home Exercise Plan posterior pelvic tilt, lateral trunk rotation, single knee to chest stretch, hamstring stretch    Consulted and Agree with Plan of Care Patient      Patient will benefit from skilled therapeutic intervention in order to improve the following deficits and impairments:  Abnormal gait, Decreased range of motion, Difficulty walking, Increased fascial restricitons, Increased muscle spasms, Decreased activity tolerance, Hypomobility, Pain, Decreased strength, Decreased mobility, Impaired sensation, Postural dysfunction  Visit Diagnosis: Acute midline low back pain with bilateral sciatica  Chronic pain of left knee  Acute pain of right knee  Difficulty in walking, not elsewhere classified   PHYSICAL THERAPY DISCHARGE SUMMARY  Visits from Start of Care: 4  Current functional level related to goals / functional outcomes: No change from initial Eval   Remaining deficits: Continues to have significant pain at work    Education / Equipment: HEP  Plan: Patient agrees to discharge.  Patient goals were not met. Patient is being discharged due to lack of progress.  ?????       Problem List Patient Active Problem List   Diagnosis Date Noted  . Knee pain, bilateral 03/29/2015  . Nonspecific abnormal electrocardiogram (ECG) (EKG) 04/21/2014  . Spinal stenosis of lumbar region  10/27/2013  . Pain of left calf 06/16/2013  . Diabetes (HPage 11/27/2011  . Hyperlipidemia 11/27/2011  . Hypertension 11/27/2011    DCarney Living PT DPT  02/24/2016, 10:34 AM  [facility] [address]Phone: [phone]  Fax:  [phone] Name: Matthew CostillaMRN: [id number] of Birth: 1Sep 30, 1962

## 2016-03-01 ENCOUNTER — Ambulatory Visit: Payer: 59 | Admitting: Physical Therapy

## 2016-03-14 DIAGNOSIS — M170 Bilateral primary osteoarthritis of knee: Secondary | ICD-10-CM | POA: Diagnosis not present

## 2016-03-22 ENCOUNTER — Other Ambulatory Visit: Payer: Self-pay | Admitting: Family Medicine

## 2016-03-22 DIAGNOSIS — E119 Type 2 diabetes mellitus without complications: Secondary | ICD-10-CM

## 2016-03-23 NOTE — Telephone Encounter (Signed)
12/2015 last ov

## 2016-05-01 ENCOUNTER — Other Ambulatory Visit: Payer: Self-pay | Admitting: Family Medicine

## 2016-05-01 DIAGNOSIS — I10 Essential (primary) hypertension: Secondary | ICD-10-CM

## 2016-05-01 DIAGNOSIS — K219 Gastro-esophageal reflux disease without esophagitis: Secondary | ICD-10-CM

## 2016-06-06 DIAGNOSIS — M170 Bilateral primary osteoarthritis of knee: Secondary | ICD-10-CM | POA: Diagnosis not present

## 2016-06-07 ENCOUNTER — Ambulatory Visit (INDEPENDENT_AMBULATORY_CARE_PROVIDER_SITE_OTHER): Payer: 59 | Admitting: Family Medicine

## 2016-06-07 ENCOUNTER — Encounter: Payer: Self-pay | Admitting: Family Medicine

## 2016-06-07 VITALS — BP 124/78 | HR 84 | Temp 97.0°F | Resp 18 | Ht 64.17 in | Wt 248.0 lb

## 2016-06-07 DIAGNOSIS — Z6841 Body Mass Index (BMI) 40.0 and over, adult: Secondary | ICD-10-CM

## 2016-06-07 DIAGNOSIS — Z23 Encounter for immunization: Secondary | ICD-10-CM

## 2016-06-07 DIAGNOSIS — E669 Obesity, unspecified: Secondary | ICD-10-CM | POA: Diagnosis not present

## 2016-06-07 DIAGNOSIS — Z13220 Encounter for screening for lipoid disorders: Secondary | ICD-10-CM

## 2016-06-07 DIAGNOSIS — I10 Essential (primary) hypertension: Secondary | ICD-10-CM | POA: Diagnosis not present

## 2016-06-07 DIAGNOSIS — E119 Type 2 diabetes mellitus without complications: Secondary | ICD-10-CM | POA: Diagnosis not present

## 2016-06-07 DIAGNOSIS — IMO0001 Reserved for inherently not codable concepts without codable children: Secondary | ICD-10-CM

## 2016-06-07 NOTE — Patient Instructions (Addendum)
Exercise with walking or swimming most days per week to help with weight loss. Swimming may be easier on back and knees.   Try to spread calories out during the day instead of 1 large meal to help with weight loss.   I will check your labwork for diabetes, cholesterol, and other blood tests.   [facility], or [facility] are options for eye care providers if you need a new provider. Once per year to look for retinopathy for diabetes.      Diabetes Mellitus and Standards of Medical Care Managing diabetes (diabetes mellitus) can be complicated. Your diabetes treatment may be managed by a team of health care providers, including:  A diet and nutrition specialist (registered dietitian).  A nurse.  A certified diabetes educator (CDE).  A diabetes specialist (endocrinologist).  An eye doctor.  A primary care provider.  A dentist.  Your health care providers follow a schedule in order to help you get the best quality of care. The following schedule is a general guideline for your diabetes management plan. Your health care providers may also give you more specific instructions. HbA1c ( hemoglobin A1c) test This test provides information about blood sugar (glucose) control over the previous 2-3 months. It is used to check whether your diabetes management plan needs to be adjusted.  If you are meeting your treatment goals, this test is done at least 2 times a year.  If you are not meeting treatment goals or if your treatment goals have changed, this test is done 4 times a year.  Blood pressure test  This test is done at every routine medical visit. For most people, the goal is less than 130/80. Ask your health care provider what your goal blood pressure should be. Dental and eye exams  Visit your dentist two times a year.  If you have type 1 diabetes, get an eye exam 3-5 years after you are diagnosed, and then once a year after your first exam. ? If you were diagnosed with  type 1 diabetes as a child, get an eye exam when you are age 35 or older and have had diabetes for 3-5 years. After the first exam, you should get an eye exam once a year.  If you have type 2 diabetes, have an eye exam as soon as you are diagnosed, and then once a year after your first exam. Foot care exam  Visual foot exams are done at every routine medical visit. The exams check for cuts, bruises, redness, blisters, sores, or other problems with the feet.  A complete foot exam is done by your health care provider once a year. This exam includes an inspection of the structure and skin of your feet, and a check of the pulses and sensation in your feet. ? Type 1 diabetes: Get your first exam 3-5 years after diagnosis. ? Type 2 diabetes: Get your first exam as soon as you are diagnosed.  Check your feet every day for cuts, bruises, redness, blisters, or sores. If you have any of these or other problems that are not healing, contact your health care provider. Kidney function test ( urine microalbumin)  This test is done once a year. ? Type 1 diabetes: Get your first test 5 years after diagnosis. ? Type 2 diabetes: Get your first test as soon as you are diagnosed.  If you have chronic kidney disease (CKD), get a serum creatinine and estimated glomerular filtration rate (eGFR) test once a year. Lipid profile (cholesterol,  HDL, LDL, triglycerides)  This test should be done when you are diagnosed with diabetes, and every 5 years after the first test. If you are on medicines to lower your cholesterol, you may need to get this test done every year. ? The goal for LDL is less than 100 mg/dL (5.5 mmol/L). If you are at high risk, the goal is less than 70 mg/dL (3.9 mmol/L). ? The goal for HDL is 40 mg/dL (2.2 mmol/L) for men and 50 mg/dL(2.8 mmol/L) for women. An HDL cholesterol of 60 mg/dL (3.3 mmol/L) or higher gives some protection against heart disease. ? The goal for triglycerides is less than 150  mg/dL (8.3 mmol/L). Immunizations  The yearly flu (influenza) vaccine is recommended for everyone 6 months or older who has diabetes.  The pneumonia (pneumococcal) vaccine is recommended for everyone 2 years or older who has diabetes. If you are 29 or older, you may get the pneumonia vaccine as a series of two separate shots.  The hepatitis B vaccine is recommended for adults shortly after they have been diagnosed with diabetes.  The Tdap (tetanus, diphtheria, and pertussis) vaccine should be given: ? According to normal childhood vaccination schedules, for children. ? Every 10 years, for adults who have diabetes.  The shingles vaccine is recommended for people who have had chicken pox and are 50 years or older. Mental and emotional health  Screening for symptoms of eating disorders, anxiety, and depression is recommended at the time of diagnosis and afterward as needed. If your screening shows that you have symptoms (you have a positive screening result), you may need further evaluation and be referred to a mental health care provider. Diabetes self-management education  Education about how to manage your diabetes is recommended at diagnosis and ongoing as needed. Treatment plan  Your treatment plan will be reviewed at every medical visit. Summary  Managing diabetes (diabetes mellitus) can be complicated. Your diabetes treatment may be managed by a team of health care providers.  Your health care providers follow a schedule in order to help you get the best quality of care.  Standards of care including having regular physical exams, blood tests, blood pressure monitoring, immunizations, screening tests, and education about how to manage your diabetes.  Your health care providers may also give you more specific instructions based on your individual health. This information is not intended to replace advice given to you by your health care provider. Make sure you discuss any questions  you have with your health care provider. Document Released: 10/15/2008 Document Revised: 09/16/2015 Document Reviewed: 09/16/2015 Elsevier Interactive Patient Education  2018 [employer].   IF you received an x-ray today, you will receive an invoice from [facility]. Please contact [facility] at [phone] with questions or concerns regarding your invoice.   IF you received labwork today, you will receive an invoice from [facility]. Please contact [facility] at [phone] with questions or concerns regarding your invoice.   Our billing staff will not be able to assist you with questions regarding bills from these companies.  You will be contacted with the lab results as soon as they are available. The fastest way to get your results is to activate your My Chart account. Instructions are located on the last page of this paperwork. If you have not heard from [name] regarding the results in 2 weeks, please contact this office.

## 2016-06-07 NOTE — Progress Notes (Signed)
Subjective:  By signing my name below, I, [name], attest that this documentation has been prepared under the direction and in the presence of [name], MD. Electronically Signed: [name], [place]. 06/07/2016 , 5:57 PM .  Patient was seen in Room 25 .   Patient ID: Matthew Garner, male    DOB: 1960/02/19, 56 y.o.   MRN: [id number] Chief Complaint  Patient presents with  . Hyperlipidemia    6 month follow up  . Hypertension  . Diabetes   HPI Matthew Garner is a 56 y.o. male Here for follow up.   DM Lab Results  Component Value Date   HGBA1C 6.5 (H) 12/22/2015   Lab Results  Component Value Date   MICROALBUR 2.5 (H) 08/03/2014   He is continued on metformin 57m QD, as controlled with A1C of 6.5 in Dec 2017. He has occasional dizziness.   Diet Wt Readings from Last 3 Encounters:  06/07/16 248 lb (112.5 kg)  12/22/15 246 lb (111.6 kg)  09/15/15 244 lb (110.7 kg)   Body mass index is 42.34 kg/m.  He is fasting during the day right now for 30 days for Ramadan. He has rare fried foods at home. He denies eating fast food. He has occasional soda or sweet tea when he has visitors. He only has 1 big meal a day at 3:30PM when he comes home from work. He leaves for work at [employer] the morning and would have a snack at 9:00AM. He would have some tea at night.   Exercise: he does occasional swimming with his children. He doesn't walk because he has bilateral knee pain and low back pain. He is followed by orthopedist, Dr. WRedmond Pulling He received right knee injections yesterday.   Eye: He was followed by an eye doctor, but his insurance had high deductible, up to $1000. He last saw eye doctor in Dec 2016. He plans to follow a new eye doctor soon.   Dentist: He is followed by dentist every 6 months- last seen a month ago.   Immunizations- pneumovax: He doesn't recall if he's received pneumovax in the past. He agrees to receiving this today.   HTN Lab Results  Component  Value Date   CREATININE 0.76 09/15/2015   He takes Losartan 51mQD. [facility] [place] [facility] [place] [facility] usually running around 130s-140s. His BP was 145/74 yesterday at [facility].   Hyperlipidemia Lab Results  Component Value Date   CHOL 190 09/15/2015   HDL 64 09/15/2015   LDLCALC 113 09/15/2015   TRIG 67 09/15/2015   CHOLHDL 3.0 09/15/2015   Lab Results  Component Value Date   ALT 15 09/15/2015   AST 15 09/15/2015   ALKPHOS 66 09/15/2015   BILITOT 0.5 09/15/2015   He is not on a statin.   Patient Active Problem List   Diagnosis Date Noted  . Knee pain, bilateral 03/29/2015  . Nonspecific abnormal electrocardiogram (ECG) (EKG) 04/21/2014  . Spinal stenosis of lumbar region 10/27/2013  . Pain of left calf 06/16/2013  . Diabetes (HCOmaha11/26/2013  . Hyperlipidemia 11/27/2011  . Hypertension 11/27/2011   Past Medical History:  Diagnosis Date  . Anemia   . Diabetes mellitus   . Environmental allergies 11/27/2011  . GERD (gastroesophageal reflux disease)   . Hyperlipidemia   . Hypertension   . Sebaceous cyst 11/10/2012  . Snores    Past Surgical History:  Procedure Laterality Date  . APPENDECTOMY    . MASS  EXCISION Bilateral 11/24/2012   Procedure: EXCISION SCALP MASS X2;  Surgeon: [name], MD;  Location: [facility];  Service: General;  Laterality: Bilateral;  . SPINE SURGERY  2004   lumb   No Known Allergies Prior to Admission medications   Medication Sig Start Date End Date Taking? Authorizing Provider  Blood Glucose Monitoring Suppl (BLOOD GLUCOSE METER) kit Use as instructed 02/03/13  Yes Daub, Loura Back, MD  losartan (COZAAR) 50 MG tablet TAKE 1 TABLET BY MOUTH  EVERY MORNING 05/03/16  Yes Wendie Agreste, MD  metFORMIN (GLUCOPHAGE) 500 MG tablet TAKE 1 TABLET BY MOUTH  DAILY WITH BREAKFAST 03/23/16  Yes Wendie Agreste, MD  ranitidine (ZANTAC) 300 MG tablet TAKE 1 TABLET BY MOUTH AT  BEDTIME 05/03/16  Yes  Wendie Agreste, MD   Social History   Social History  . Marital status: Married    Spouse name: N/A  . Number of children: N/A  . Years of education: N/A   Occupational History  . Not on file.   Social History Main Topics  . Smoking status: Never Smoker  . Smokeless tobacco: Never Used  . Alcohol use No  . Drug use: No  . Sexual activity: Not on file   Other Topics Concern  . Not on file   Social History Narrative  . No narrative on file   Review of Systems  Constitutional: Negative for fatigue and unexpected weight change.  Eyes: Negative for visual disturbance.  Respiratory: Negative for cough, chest tightness and shortness of breath.   Cardiovascular: Negative for chest pain, palpitations and leg swelling.  Gastrointestinal: Negative for abdominal pain and blood in stool.  Neurological: Negative for dizziness, light-headedness and headaches.       Objective:   Physical Exam  Constitutional: He is oriented to person, place, and time. He appears well-developed and well-nourished.  HENT:  Head: Normocephalic and atraumatic.  Eyes: EOM are normal. Pupils are equal, round, and reactive to light.  Neck: No JVD present. Carotid bruit is not present.  Cardiovascular: Normal rate, regular rhythm and normal heart sounds.   No murmur heard. Pulmonary/Chest: Effort normal and breath sounds normal. He has no rales.  Musculoskeletal: He exhibits no edema.  Neurological: He is alert and oriented to person, place, and time.  Skin: Skin is warm and dry.  Psychiatric: He has a normal mood and affect.  Vitals reviewed.   Vitals:   06/07/16 1703  BP: 124/78  Pulse: 84  Resp: 18  Temp: 97 F (36.1 C)  TempSrc: Oral  SpO2: 98%  Weight: 248 lb (112.5 kg)  Height: 5' 4.17" (1.63 m)      Assessment & Plan:    Matthew Garner is a 56 y.o. male Type 2 diabetes mellitus without complication, without long-term current use of insulin ([place]) - Plan: Hemoglobin A1c  - Check  A1c, diet changes discussed to work on weight loss and pool based exercise may be easier with back and knee issues.   Screening for hyperlipidemia - Plan: Lipid panel  -check lipid panel. May need to be on statin as diabetic   Essential hypertension - Plan: Comprehensive metabolic panel  - stable, no med changes.   Class 3 obesity with serious comorbidity and body mass index (BMI) of 40.0 to 44.9 in adult, unspecified obesity type (HCC)  - spread calories out through day - smaller portions, more frequent meals/healthy snacks.   - increase exercise/activity.   - consider nutritionist eval  or possibly bariatric specialist eval if persistent difficulty with weight loss.   No orders of the defined types were placed in this encounter.  Patient Instructions    Exercise with walking or swimming most days per week to help with weight loss. Swimming may be easier on back and knees.   Try to spread calories out during the day instead of 1 large meal to help with weight loss.   I will check your labwork for diabetes, cholesterol, and other blood tests.   [facility], or [facility] are options for eye care providers if you need a new provider. Once per year to look for retinopathy for diabetes.      Diabetes Mellitus and Standards of Medical Care Managing diabetes (diabetes mellitus) can be complicated. Your diabetes treatment may be managed by a team of health care providers, including:  A diet and nutrition specialist (registered dietitian).  A nurse.  A certified diabetes educator (CDE).  A diabetes specialist (endocrinologist).  An eye doctor.  A primary care provider.  A dentist.  Your health care providers follow a schedule in order to help you get the best quality of care. The following schedule is a general guideline for your diabetes management plan. Your health care providers may also give you more specific instructions. HbA1c ( hemoglobin A1c) test This test  provides information about blood sugar (glucose) control over the previous 2-3 months. It is used to check whether your diabetes management plan needs to be adjusted.  If you are meeting your treatment goals, this test is done at least 2 times a year.  If you are not meeting treatment goals or if your treatment goals have changed, this test is done 4 times a year.  Blood pressure test  This test is done at every routine medical visit. For most people, the goal is less than 130/80. Ask your health care provider what your goal blood pressure should be. Dental and eye exams  Visit your dentist two times a year.  If you have type 1 diabetes, get an eye exam 3-5 years after you are diagnosed, and then once a year after your first exam. ? If you were diagnosed with type 1 diabetes as a child, get an eye exam when you are age 48 or older and have had diabetes for 3-5 years. After the first exam, you should get an eye exam once a year.  If you have type 2 diabetes, have an eye exam as soon as you are diagnosed, and then once a year after your first exam. Foot care exam  Visual foot exams are done at every routine medical visit. The exams check for cuts, bruises, redness, blisters, sores, or other problems with the feet.  A complete foot exam is done by your health care provider once a year. This exam includes an inspection of the structure and skin of your feet, and a check of the pulses and sensation in your feet. ? Type 1 diabetes: Get your first exam 3-5 years after diagnosis. ? Type 2 diabetes: Get your first exam as soon as you are diagnosed.  Check your feet every day for cuts, bruises, redness, blisters, or sores. If you have any of these or other problems that are not healing, contact your health care provider. Kidney function test ( urine microalbumin)  This test is done once a year. ? Type 1 diabetes: Get your first test 5 years after diagnosis. ? Type 2 diabetes: Get your first test  as soon as you are diagnosed.  If you have chronic kidney disease (CKD), get a serum creatinine and estimated glomerular filtration rate (eGFR) test once a year. Lipid profile (cholesterol, HDL, LDL, triglycerides)  This test should be done when you are diagnosed with diabetes, and every 5 years after the first test. If you are on medicines to lower your cholesterol, you may need to get this test done every year. ? The goal for LDL is less than 100 mg/dL (5.5 mmol/L). If you are at high risk, the goal is less than 70 mg/dL (3.9 mmol/L). ? The goal for HDL is 40 mg/dL (2.2 mmol/L) for men and 50 mg/dL(2.8 mmol/L) for women. An HDL cholesterol of 60 mg/dL (3.3 mmol/L) or higher gives some protection against heart disease. ? The goal for triglycerides is less than 150 mg/dL (8.3 mmol/L). Immunizations  The yearly flu (influenza) vaccine is recommended for everyone 6 months or older who has diabetes.  The pneumonia (pneumococcal) vaccine is recommended for everyone 2 years or older who has diabetes. If you are 4 or older, you may get the pneumonia vaccine as a series of two separate shots.  The hepatitis B vaccine is recommended for adults shortly after they have been diagnosed with diabetes.  The Tdap (tetanus, diphtheria, and pertussis) vaccine should be given: ? According to normal childhood vaccination schedules, for children. ? Every 10 years, for adults who have diabetes.  The shingles vaccine is recommended for people who have had chicken pox and are 50 years or older. Mental and emotional health  Screening for symptoms of eating disorders, anxiety, and depression is recommended at the time of diagnosis and afterward as needed. If your screening shows that you have symptoms (you have a positive screening result), you may need further evaluation and be referred to a mental health care provider. Diabetes self-management education  Education about how to manage your diabetes is  recommended at diagnosis and ongoing as needed. Treatment plan  Your treatment plan will be reviewed at every medical visit. Summary  Managing diabetes (diabetes mellitus) can be complicated. Your diabetes treatment may be managed by a team of health care providers.  Your health care providers follow a schedule in order to help you get the best quality of care.  Standards of care including having regular physical exams, blood tests, blood pressure monitoring, immunizations, screening tests, and education about how to manage your diabetes.  Your health care providers may also give you more specific instructions based on your individual health. This information is not intended to replace advice given to you by your health care provider. Make sure you discuss any questions you have with your health care provider. Document Released: 10/15/2008 Document Revised: 09/16/2015 Document Reviewed: 09/16/2015 Elsevier Interactive Patient Education  2018 [employer].   IF you received an x-ray today, you will receive an invoice from [facility]. Please contact [facility] at [phone] with questions or concerns regarding your invoice.   IF you received labwork today, you will receive an invoice from [facility]. Please contact [facility] at [phone] with questions or concerns regarding your invoice.   Our billing staff will not be able to assist you with questions regarding bills from these companies.  You will be contacted with the lab results as soon as they are available. The fastest way to get your results is to activate your My Chart account. Instructions are located on the last page of this paperwork. If you have not heard from [name] regarding  the results in 2 weeks, please contact this office.       I personally performed the services described in this documentation, which was scribed in my presence. The recorded information has been reviewed and considered for accuracy and  completeness, addended by me as needed, and agree with information above.  Signed,   [name], MD [facility] [place].  06/08/16 2:06 PM

## 2016-06-08 LAB — LIPID PANEL
CHOLESTEROL TOTAL: 233 mg/dL — AB (ref 100–199)
Chol/HDL Ratio: 3.3 ratio (ref 0.0–5.0)
HDL: 71 mg/dL (ref >39)
LDL Calculated: 149 mg/dL — ABNORMAL HIGH (ref 0–99)
TRIGLYCERIDES: 66 mg/dL (ref 0–149)
VLDL CHOLESTEROL CAL: 13 mg/dL (ref 5–40)

## 2016-06-08 LAB — COMPREHENSIVE METABOLIC PANEL
ALK PHOS: 87 IU/L (ref 39–117)
ALT: 19 IU/L (ref 0–44)
AST: 14 IU/L (ref 0–40)
Albumin/Globulin Ratio: 1.5 (ref 1.2–2.2)
Albumin: 4.6 g/dL (ref 3.5–5.5)
BILIRUBIN TOTAL: 0.3 mg/dL (ref 0.0–1.2)
BUN/Creatinine Ratio: 18 (ref 9–20)
BUN: 16 mg/dL (ref 6–24)
CHLORIDE: 97 mmol/L (ref 96–106)
CO2: 24 mmol/L (ref 18–29)
Calcium: 10.3 mg/dL — ABNORMAL HIGH (ref 8.7–10.2)
Creatinine, Ser: 0.9 mg/dL (ref 0.76–1.27)
GFR calc Af Amer: 112 mL/min/{1.73_m2} (ref >59)
GFR calc non Af Amer: 97 mL/min/{1.73_m2} (ref >59)
GLUCOSE: 235 mg/dL — AB (ref 65–99)
Globulin, Total: 3 g/dL (ref 1.5–4.5)
POTASSIUM: 4.9 mmol/L (ref 3.5–5.2)
Sodium: 136 mmol/L (ref 134–144)
Total Protein: 7.6 g/dL (ref 6.0–8.5)

## 2016-06-08 LAB — HEMOGLOBIN A1C
Est. average glucose Bld gHb Est-mCnc: 154 mg/dL
HEMOGLOBIN A1C: 7 % — AB (ref 4.8–5.6)

## 2016-06-20 ENCOUNTER — Other Ambulatory Visit: Payer: Self-pay | Admitting: Family Medicine

## 2016-06-20 MED ORDER — ATORVASTATIN CALCIUM 10 MG PO TABS
10.0000 mg | ORAL_TABLET | Freq: Every day | ORAL | 0 refills | Status: DC
Start: 2016-06-20 — End: 2016-09-13

## 2016-06-20 NOTE — Progress Notes (Signed)
See lab notes

## 2016-08-01 ENCOUNTER — Other Ambulatory Visit: Payer: Self-pay | Admitting: Family Medicine

## 2016-08-01 DIAGNOSIS — K219 Gastro-esophageal reflux disease without esophagitis: Secondary | ICD-10-CM

## 2016-08-08 DIAGNOSIS — M170 Bilateral primary osteoarthritis of knee: Secondary | ICD-10-CM | POA: Diagnosis not present

## 2016-08-20 ENCOUNTER — Encounter (HOSPITAL_COMMUNITY): Payer: Self-pay | Admitting: Radiology

## 2016-08-20 ENCOUNTER — Emergency Department (HOSPITAL_COMMUNITY): Payer: 59

## 2016-08-20 ENCOUNTER — Emergency Department (HOSPITAL_COMMUNITY)
Admission: EM | Admit: 2016-08-20 | Discharge: 2016-08-20 | Disposition: A | Discharge status: Home / Self Care | Payer: 59 | Attending: Emergency Medicine | Admitting: Emergency Medicine

## 2016-08-20 DIAGNOSIS — R109 Unspecified abdominal pain: Secondary | ICD-10-CM | POA: Diagnosis not present

## 2016-08-20 DIAGNOSIS — Z7984 Long term (current) use of oral hypoglycemic drugs: Secondary | ICD-10-CM | POA: Insufficient documentation

## 2016-08-20 DIAGNOSIS — M549 Dorsalgia, unspecified: Secondary | ICD-10-CM | POA: Insufficient documentation

## 2016-08-20 DIAGNOSIS — R112 Nausea with vomiting, unspecified: Secondary | ICD-10-CM

## 2016-08-20 DIAGNOSIS — E119 Type 2 diabetes mellitus without complications: Secondary | ICD-10-CM | POA: Diagnosis not present

## 2016-08-20 DIAGNOSIS — I10 Essential (primary) hypertension: Secondary | ICD-10-CM | POA: Insufficient documentation

## 2016-08-20 DIAGNOSIS — R42 Dizziness and giddiness: Secondary | ICD-10-CM | POA: Diagnosis not present

## 2016-08-20 DIAGNOSIS — Z79899 Other long term (current) drug therapy: Secondary | ICD-10-CM | POA: Diagnosis not present

## 2016-08-20 LAB — COMPREHENSIVE METABOLIC PANEL
ALT: 21 U/L (ref 17–63)
AST: 23 U/L (ref 15–41)
Albumin: 4.1 g/dL (ref 3.5–5.0)
Alkaline Phosphatase: 83 U/L (ref 38–126)
Anion gap: 10 (ref 5–15)
BUN: 13 mg/dL (ref 6–20)
CHLORIDE: 103 mmol/L (ref 101–111)
CO2: 26 mmol/L (ref 22–32)
CREATININE: 0.77 mg/dL (ref 0.61–1.24)
Calcium: 9.3 mg/dL (ref 8.9–10.3)
GFR calc Af Amer: >60 mL/min (ref >60)
GFR calc non Af Amer: >60 mL/min (ref >60)
Glucose, Bld: 130 mg/dL — ABNORMAL HIGH (ref 65–99)
Potassium: 3.7 mmol/L (ref 3.5–5.1)
Sodium: 139 mmol/L (ref 135–145)
Total Bilirubin: 0.6 mg/dL (ref 0.3–1.2)
Total Protein: 7.4 g/dL (ref 6.5–8.1)

## 2016-08-20 LAB — URINALYSIS, ROUTINE W REFLEX MICROSCOPIC
BILIRUBIN URINE: NEGATIVE
Glucose, UA: NEGATIVE mg/dL
HGB URINE DIPSTICK: NEGATIVE
KETONES UR: NEGATIVE mg/dL
Leukocytes, UA: NEGATIVE
Nitrite: NEGATIVE
Protein, ur: NEGATIVE mg/dL
Specific Gravity, Urine: 1.009 (ref 1.005–1.030)
pH: 7 (ref 5.0–8.0)

## 2016-08-20 LAB — CBC WITH DIFFERENTIAL/PLATELET
Basophils Absolute: 0 10*3/uL (ref 0.0–0.1)
Basophils Relative: 1 %
Eosinophils Absolute: 0.1 10*3/uL (ref 0.0–0.7)
Eosinophils Relative: 1 %
HEMATOCRIT: 43.4 % (ref 39.0–52.0)
HEMOGLOBIN: 15.4 g/dL (ref 13.0–17.0)
LYMPHS ABS: 2.3 10*3/uL (ref 0.7–4.0)
Lymphocytes Relative: 29 %
MCH: 30 pg (ref 26.0–34.0)
MCHC: 35.5 g/dL (ref 30.0–36.0)
MCV: 84.6 fL (ref 78.0–100.0)
MONO ABS: 0.7 10*3/uL (ref 0.1–1.0)
MONOS PCT: 9 %
NEUTROS ABS: 4.7 10*3/uL (ref 1.7–7.7)
NEUTROS PCT: 60 %
Platelets: 279 10*3/uL (ref 150–400)
RBC: 5.13 MIL/uL (ref 4.22–5.81)
RDW: 12.6 % (ref 11.5–15.5)
WBC: 7.8 10*3/uL (ref 4.0–10.5)

## 2016-08-20 MED ORDER — OXYCODONE-ACETAMINOPHEN 5-325 MG PO TABS: 1.0000 | ORAL_TABLET | ORAL | 0 refills | Status: DC | PRN

## 2016-08-20 MED ORDER — HYDROMORPHONE HCL 1 MG/ML IJ SOLN
1.0000 mg | Freq: Once | INTRAMUSCULAR | Status: AC
  Administered 2016-08-20: 1 mg via INTRAVENOUS
  Filled 2016-08-20: qty 1

## 2016-08-20 MED ORDER — ONDANSETRON HCL 4 MG PO TABS: 4.0000 mg | ORAL_TABLET | Freq: Three times a day (TID) | ORAL | 0 refills | Status: DC | PRN

## 2016-08-20 MED ORDER — KETOROLAC TROMETHAMINE 15 MG/ML IJ SOLN
15.0000 mg | Freq: Once | INTRAMUSCULAR | Status: AC
  Administered 2016-08-20: 15 mg via INTRAVENOUS
  Filled 2016-08-20: qty 1

## 2016-08-20 MED ORDER — OXYCODONE-ACETAMINOPHEN 5-325 MG PO TABS
1.0000 | ORAL_TABLET | ORAL | Status: DC | PRN
  Administered 2016-08-20: 1 via ORAL
  Filled 2016-08-20: qty 1

## 2016-08-20 MED ORDER — ONDANSETRON HCL 4 MG/2ML IJ SOLN
4.0000 mg | Freq: Once | INTRAMUSCULAR | Status: AC
  Administered 2016-08-20: 4 mg via INTRAVENOUS
  Filled 2016-08-20: qty 2

## 2016-08-20 MED ORDER — SODIUM CHLORIDE 0.9 % IV BOLUS (SEPSIS)
1000.0000 mL | Freq: Once | INTRAVENOUS | Status: AC
  Administered 2016-08-20: 1000 mL via INTRAVENOUS

## 2016-08-20 NOTE — ED Triage Notes (Signed)
Pt states he has had R flank pain similar to a kidney stone x 3 days. Alert and oriented.

## 2016-08-20 NOTE — ED Notes (Signed)
ED Provider at bedside. 

## 2016-08-20 NOTE — Discharge Instructions (Signed)
Your workup today showed no evidence of infection or intra-abdominal injury. We suspect you may have recently passed a kidney stone given her history. Please take the pain and nausea medicine to help with your symptoms and continue your outpatient regimen for blood pressure management. Please follow-up with your primary care physician for further management. If any symptoms change or worsen, please return to the nearest emergency department.

## 2016-08-20 NOTE — ED Provider Notes (Signed)
[name] DEPT Provider Note   CSN: [id number] Arrival date & time: 08/20/16  7824     History   Chief Complaint Chief Complaint  Patient presents with  . Flank Pain    HPI Matthew Garner is a 56 y.o. male.  The history is provided by the patient and medical records.  Flank Pain  This is a recurrent problem. The current episode started 2 days ago. The problem occurs constantly. The problem has been rapidly worsening. Associated symptoms include abdominal pain. Pertinent negatives include no chest pain, no headaches and no shortness of breath. Nothing aggravates the symptoms. Nothing relieves the symptoms. He has tried nothing for the symptoms. The treatment provided no relief.    Past Medical History:  Diagnosis Date  . Anemia   . Diabetes mellitus   . Environmental allergies 11/27/2011  . GERD (gastroesophageal reflux disease)   . Hyperlipidemia   . Hypertension   . Sebaceous cyst 11/10/2012  . Snores     Patient Active Problem List   Diagnosis Date Noted  . Knee pain, bilateral 03/29/2015  . Nonspecific abnormal electrocardiogram (ECG) (EKG) 04/21/2014  . Spinal stenosis of lumbar region 10/27/2013  . Pain of left calf 06/16/2013  . Diabetes ([place]) 11/27/2011  . Hyperlipidemia 11/27/2011  . Hypertension 11/27/2011    Past Surgical History:  Procedure Laterality Date  . APPENDECTOMY    . MASS EXCISION Bilateral 11/24/2012   Procedure: EXCISION SCALP MASS X2;  Surgeon: [name], MD;  Location: [facility];  Service: General;  Laterality: Bilateral;  . SPINE SURGERY  2004   lumb       Home Medications    Prior to Admission medications   Medication Sig Start Date End Date Taking? Authorizing Provider  atorvastatin (LIPITOR) 10 MG tablet Take 1 tablet (10 mg total) by mouth daily. 06/20/16   Wendie Agreste, MD  Blood Glucose Monitoring Suppl (BLOOD GLUCOSE METER) kit Use as instructed 02/03/13   [name], MD  losartan (COZAAR)  50 MG tablet TAKE 1 TABLET BY MOUTH  EVERY MORNING 05/03/16   Wendie Agreste, MD  metFORMIN (GLUCOPHAGE) 500 MG tablet TAKE 1 TABLET BY MOUTH  DAILY WITH BREAKFAST 03/23/16   Wendie Agreste, MD  ranitidine (ZANTAC) 300 MG tablet TAKE 1 TABLET BY MOUTH AT  BEDTIME 08/01/16   Wendie Agreste, MD    Family History No family history on file.  Social History Social History  Substance Use Topics  . Smoking status: Never Smoker  . Smokeless tobacco: Never Used  . Alcohol use No     Allergies   Patient has no known allergies.   Review of Systems Review of Systems  Constitutional: Negative for chills, diaphoresis, fatigue and fever.  HENT: Negative for congestion and rhinorrhea.   Respiratory: Negative for chest tightness, shortness of breath, wheezing and stridor.   Cardiovascular: Negative for chest pain and palpitations.  Gastrointestinal: Positive for abdominal pain and nausea. Negative for constipation, diarrhea and vomiting.  Genitourinary: Positive for flank pain. Negative for dysuria, frequency and hematuria.  Musculoskeletal: Positive for back pain (right back). Negative for neck pain and neck stiffness.  Skin: Negative for rash and wound.  Neurological: Positive for light-headedness. Negative for headaches.  Psychiatric/Behavioral: Negative for agitation.  All other systems reviewed and are negative.    Physical Exam Updated Vital Signs BP (!) 141/97 (BP Location: Right Arm)   Pulse 66   Temp 98.5 F (36.9 C) (Oral)  Resp 16   SpO2 98%   Physical Exam  Constitutional: He is oriented to person, place, and time. He appears well-developed and well-nourished.  HENT:  Head: Normocephalic and atraumatic.  Mouth/Throat: Oropharynx is clear and moist. No oropharyngeal exudate.  Eyes: Pupils are equal, round, and reactive to light. Conjunctivae and EOM are normal.  Neck: Normal range of motion. Neck supple.  Cardiovascular: Normal rate and intact distal pulses.   No  murmur heard. Pulmonary/Chest: Effort normal and breath sounds normal. No respiratory distress. He has no wheezes. He has no rales. He exhibits no tenderness.  Abdominal: Soft. Normal appearance. There is no tenderness. There is no CVA tenderness.    Musculoskeletal: He exhibits tenderness. He exhibits no edema.       Thoracic back: He exhibits tenderness.       Back:  Neurological: He is alert and oriented to person, place, and time. No sensory deficit. He exhibits normal muscle tone.  Skin: Skin is warm and dry. Capillary refill takes less than 2 seconds. No erythema. No pallor.  Psychiatric: He has a normal mood and affect.  Nursing note and vitals reviewed.    ED Treatments / Results  Labs (all labs ordered are listed, but only abnormal results are displayed) Labs Reviewed  COMPREHENSIVE METABOLIC PANEL - Abnormal; Notable for the following:       Result Value   Glucose, Bld 130 (*)    All other components within normal limits  URINE CULTURE  URINALYSIS, ROUTINE W REFLEX MICROSCOPIC  CBC WITH DIFFERENTIAL/PLATELET    EKG  EKG Interpretation None       Radiology Ct Renal Stone Study  Result Date: 08/20/2016 CLINICAL DATA:  Right flank pain. EXAM: CT ABDOMEN AND PELVIS WITHOUT CONTRAST TECHNIQUE: Multidetector CT imaging of the abdomen and pelvis was performed following the standard protocol without IV contrast. COMPARISON:  12/08/2012 FINDINGS: Lower chest: No acute abnormality. Hepatobiliary: No focal liver abnormality is seen. No gallstones, gallbladder wall thickening, or biliary dilatation. Pancreas: Unremarkable. No pancreatic ductal dilatation or surrounding inflammatory changes. Spleen: Normal in size without focal abnormality. Adrenals/Urinary Tract: The adrenal glands are normal. Unremarkable appearance of both kidneys. No mass or hydronephrosis identified. No urinary tract calculi identified. The urinary bladder is unremarkable. Stomach/Bowel: The stomach appears  normal. The small bowel loops have a normal course and caliber. Normal appearance of the colon. The appendix is not visualized compatible with the history of prior appendectomy. Vascular/Lymphatic: Normal appearance of the abdominal aorta. No enlarged retroperitoneal or mesenteric adenopathy. No enlarged pelvic or inguinal lymph nodes. Reproductive: Prostate gland is enlarged. Other: No abdominal wall hernia or abnormality. No abdominopelvic ascites. Musculoskeletal: Degenerative disc disease identified within the lumbar spine. No aggressive lytic or sclerotic bone lesions. IMPRESSION: 1. No acute findings within the abdomen or pelvis. No renal calculi identified. Electronically Signed   By: Kerby Moors M.D.   On: 08/20/2016 10:33    Procedures Procedures (including critical care time)  Medications Ordered in ED Medications  HYDROmorphone (DILAUDID) injection 1 mg (1 mg Intravenous Given 08/20/16 0951)  ondansetron (ZOFRAN) injection 4 mg (4 mg Intravenous Given 08/20/16 0951)  ketorolac (TORADOL) 15 MG/ML injection 15 mg (15 mg Intravenous Given 08/20/16 0951)  HYDROmorphone (DILAUDID) injection 1 mg (1 mg Intravenous Given 08/20/16 1152)  sodium chloride 0.9 % bolus 1,000 mL (0 mLs Intravenous Stopped 08/20/16 1537)     Initial Impression / Assessment and Plan / ED Course  I have reviewed the triage vital signs and  the nursing notes.  Pertinent labs & imaging results that were available during my care of the patient were reviewed by me and considered in my medical decision making (see chart for details).     Matthew Garner is a 56 y.o. male with a past medical history significant for hypertension, hyperlipidemia, diabetes, and prior kidney stones who presents with right flank pain. Patient reports that he's had pain for the last few days. He says this is "the same as my last kidney stone". He reports his last stone was 1.5 years ago. He does say that he has had have procedures to remove kidney  stones back in [place]. He denies any fevers, chills, dysuria, or urine changes. He reports the pain is moderate to severe in the right flank and radiates from his back around towards his right lower quadrant. He has a history of appendectomy and denies any other abdominal tenderness. He reports nausea but no vomiting. He has not had success with any home medications. He denies other complaints on arrival. He denies any groin pain or problems.   On exam, patient is tenderness in the right flank area. No significant CVA tenderness. No abdominal tenderness. Lungs clear. No other abnormalities on exam.  Given patient's history of kidney stones including ones needing removal, patient will have CT imaging to look for hydronephrosis or large stone. He'll be given pain medicine, nausea medicine. He'll also have blood work to look for evidence of kidney dysfunction or other infection.  Anticipate reassessment following workup.  CT scan reassurung with no active stone or hydronephrosis. Labs reassuring. No UTI seen.   Suspect recently passed stone vs msk flank pain. PT felt better after fluids and meds. Pt will follow up with PCP and return precautions given. PT had no other questions or concerns and was discharged in good condition with improving symptoms and reassuring workup.     Final Clinical Impressions(s) / ED Diagnoses   Final diagnoses:  Right flank pain  Non-intractable vomiting with nausea, unspecified vomiting type    New Prescriptions Discharge Medication List as of 08/20/2016  3:47 PM    START taking these medications   Details  ondansetron (ZOFRAN) 4 MG tablet Take 1 tablet (4 mg total) by mouth every 8 (eight) hours as needed for nausea or vomiting., Starting Mon 08/20/2016, Print    oxyCODONE-acetaminophen (PERCOCET/ROXICET) 5-325 MG tablet Take 1 tablet by mouth every 4 (four) hours as needed for severe pain., Starting Mon 08/20/2016, Print       Clinical Impression: 1. Right  flank pain   2. Non-intractable vomiting with nausea, unspecified vomiting type     Disposition: Discharge  Condition: Good  I have discussed the results, Dx and Tx plan with the pt(& family if present). He/she/they expressed understanding and agree(s) with the plan. Discharge instructions discussed at great length. Strict return precautions discussed and pt &/or family have verbalized understanding of the instructions. No further questions at time of discharge.    Discharge Medication List as of 08/20/2016  3:47 PM    START taking these medications   Details  ondansetron (ZOFRAN) 4 MG tablet Take 1 tablet (4 mg total) by mouth every 8 (eight) hours as needed for nausea or vomiting., Starting Mon 08/20/2016, Print    oxyCODONE-acetaminophen (PERCOCET/ROXICET) 5-325 MG tablet Take 1 tablet by mouth every 4 (four) hours as needed for severe pain., Starting Mon 08/20/2016, Print        Follow Up: [address]  [place] [id number] [phone] Schedule an appointment as soon as possible for a visit    [facility] [place] [id number] [place] [phone]  If symptoms worsen     Brendy Ficek, Gwenyth Allegra, MD 08/20/16 2031

## 2016-08-21 LAB — URINE CULTURE: Culture: NO GROWTH

## 2016-08-25 ENCOUNTER — Other Ambulatory Visit: Payer: Self-pay | Admitting: Family Medicine

## 2016-08-25 DIAGNOSIS — I10 Essential (primary) hypertension: Secondary | ICD-10-CM

## 2016-08-25 DIAGNOSIS — E119 Type 2 diabetes mellitus without complications: Secondary | ICD-10-CM

## 2016-09-12 DIAGNOSIS — I8311 Varicose veins of right lower extremity with inflammation: Secondary | ICD-10-CM | POA: Diagnosis not present

## 2016-09-12 DIAGNOSIS — I8312 Varicose veins of left lower extremity with inflammation: Secondary | ICD-10-CM | POA: Diagnosis not present

## 2016-09-13 ENCOUNTER — Ambulatory Visit (INDEPENDENT_AMBULATORY_CARE_PROVIDER_SITE_OTHER): Payer: 59 | Admitting: Family Medicine

## 2016-09-13 ENCOUNTER — Encounter: Payer: Self-pay | Admitting: Family Medicine

## 2016-09-13 VITALS — BP 128/74 | HR 67 | Temp 98.3°F | Resp 16 | Ht 64.0 in | Wt 256.0 lb

## 2016-09-13 DIAGNOSIS — E669 Obesity, unspecified: Secondary | ICD-10-CM

## 2016-09-13 DIAGNOSIS — Z6841 Body Mass Index (BMI) 40.0 and over, adult: Secondary | ICD-10-CM

## 2016-09-13 DIAGNOSIS — I10 Essential (primary) hypertension: Secondary | ICD-10-CM

## 2016-09-13 DIAGNOSIS — IMO0001 Reserved for inherently not codable concepts without codable children: Secondary | ICD-10-CM

## 2016-09-13 DIAGNOSIS — Z1211 Encounter for screening for malignant neoplasm of colon: Secondary | ICD-10-CM

## 2016-09-13 DIAGNOSIS — E119 Type 2 diabetes mellitus without complications: Secondary | ICD-10-CM

## 2016-09-13 DIAGNOSIS — E785 Hyperlipidemia, unspecified: Secondary | ICD-10-CM

## 2016-09-13 MED ORDER — ATORVASTATIN CALCIUM 10 MG PO TABS: 10.0000 mg | ORAL_TABLET | Freq: Every day | ORAL | 1 refills | Status: DC

## 2016-09-13 MED ORDER — LOSARTAN POTASSIUM 50 MG PO TABS: 50.0000 mg | ORAL_TABLET | Freq: Every morning | ORAL | 1 refills | Status: DC

## 2016-09-13 MED ORDER — METFORMIN HCL 500 MG PO TABS: 500.0000 mg | ORAL_TABLET | Freq: Every day | ORAL | 1 refills | Status: DC

## 2016-09-13 NOTE — Progress Notes (Signed)
Subjective:  By signing my name below, I, Matthew Garner, attest that this documentation has been prepared under the direction and in the presence of Matthew Ray, MD. Electronically Signed: Moises Garner, [place]. 09/13/2016 , 4:58 PM .  Patient was seen in Room 10 .   Patient ID: Matthew Garner, male    DOB: 25-Jun-1960, 56 y.o.   MRN: [id number] Chief Complaint  Patient presents with  . Follow-up    DM   HPI Matthew Garner is a 56 y.o. male Here for follow up on diabetes. His last office visit with me was on June 7th. He is fasting today since 8:00AM today.   DM Lab Results  Component Value Date   HGBA1C 7.0 (H) 06/07/2016   Lab Results  Component Value Date   MICROALBUR 2.5 (H) 08/03/2014   He was continued on metformin 522m QD.   Vision: He was planning to schedule new optho appointment. [facility] He bought reading glasses. Denies diabetic retinopathy. Follow up in 1 year.   Dentist: He is followed by dentist every 6 months.   Immunizations: He received pneumovax at last visit.  He'll receive flu shot at his job, planned for Oct 2nd.  He plans to defer tetanus shot.   Colonoscopy: not done yet. Received referral today.   Exercise: denies regular exercise, as he's working all day. He lifts boxes for work.   Wt Readings from Last 3 Encounters:  09/13/16 256 lb (116.1 kg)  06/07/16 248 lb (112.5 kg)  12/22/15 246 lb (111.6 kg)   Body mass index is 43.94 kg/m.  Diet: he sometimes eats fast food, but rare. He mostly eats at home. He drinks occasional sodas, but denies sweet tea. [place] disliked some of the options suggested to him. He would like to be referred to bariatrics doctor to discuss other options.   HTN Lab Results  Component Value Date   CREATININE 0.77 08/20/2016   He takes Losartan 53mQD. He denies dizziness, lightheadedness, headache, chest  pain, shortness of breath, nausea, or vomiting.   Hyperlipidemia Lab Results  Component Value Date   CHOL 233 (H) 06/07/2016   HDL 71 06/07/2016   LDLCALC 149 (H) 06/07/2016   TRIG 66 06/07/2016   CHOLHDL 3.3 06/07/2016   He was started on Lipitor 1027mD, but patient did not receive it.   ER follow up [facility]. He has history of kidney stones. He had CT done, without active stone; suspected past stone versus muscular pain. He was improved with fluids and medications in ER.   Patient Active Problem List   Diagnosis Date Noted  . Knee pain, bilateral 03/29/2015  . Nonspecific abnormal electrocardiogram (ECG) (EKG) 04/21/2014  . Spinal stenosis of lumbar region 10/27/2013  . Pain of left calf 06/16/2013  . Diabetes ([place]/26/2013  . Hyperlipidemia 11/27/2011  . Hypertension 11/27/2011   Past Medical History:  Diagnosis Date  . Anemia   . Diabetes mellitus   . Environmental allergies 11/27/2011  . GERD (gastroesophageal reflux disease)   . Hyperlipidemia   . Hypertension   . Sebaceous cyst 11/10/2012  . Snores    Past Surgical History:  Procedure Laterality Date  . APPENDECTOMY    . MASS EXCISION Bilateral 11/24/2012   Procedure: EXCISION SCALP MASS X2;  Surgeon:  [name], MD;  Location: [facility];  Service: General;  Laterality: Bilateral;  . SPINE SURGERY  2004   lumb   No Known Allergies Prior to Admission medications   Medication Sig Start Date End Date Taking? Authorizing Provider  losartan (COZAAR) 50 MG tablet TAKE 1 TABLET BY MOUTH  EVERY MORNING 08/27/16  Yes Wendie Agreste, MD  metFORMIN (GLUCOPHAGE) 500 MG tablet TAKE 1 TABLET BY MOUTH  DAILY WITH BREAKFAST 08/27/16  Yes Wendie Agreste, MD  ranitidine (ZANTAC) 300 MG tablet TAKE 1 TABLET BY MOUTH AT  BEDTIME 08/01/16  Yes Wendie Agreste, MD  atorvastatin (LIPITOR) 10 MG tablet Take 1 tablet (10 mg total) by mouth  daily. Patient not taking: Reported on 08/20/2016 06/20/16   Wendie Agreste, MD  Garner Glucose Monitoring Suppl (Garner GLUCOSE METER) kit Use as instructed Patient not taking: Reported on 09/13/2016 02/03/13   [name], MD  ondansetron (ZOFRAN) 4 MG tablet Take 1 tablet (4 mg total) by mouth every 8 (eight) hours as needed for nausea or vomiting. Patient not taking: Reported on 09/13/2016 08/20/16   Tegeler, Gwenyth Allegra, MD  oxyCODONE-acetaminophen (PERCOCET/ROXICET) 5-325 MG tablet Take 1 tablet by mouth every 4 (four) hours as needed for severe pain. Patient not taking: Reported on 09/13/2016 08/20/16   Tegeler, Gwenyth Allegra, MD  traMADol (ULTRAM) 50 MG tablet Take 50 mg by mouth every 6 (six) hours as needed.    [provider]   Social History   Social History  . Marital status: Married    Spouse name: N/A  . Number of children: N/A  . Years of education: N/A   Occupational History  . Not on file.   Social History Main Topics  . Smoking status: Never Smoker  . Smokeless tobacco: Never Used  . Alcohol use No  . Drug use: No  . Sexual activity: Not on file   Other Topics Concern  . Not on file   Social History Narrative  . No narrative on file   Review of Systems  Constitutional: Negative for fatigue and unexpected weight change.  Eyes: Negative for visual disturbance.  Respiratory: Negative for cough, chest tightness and shortness of breath.   Cardiovascular: Negative for chest pain, palpitations and leg swelling.  Gastrointestinal: Negative for abdominal pain and Garner in stool.  Neurological: Negative for dizziness, light-headedness and headaches.       Objective:   Physical Exam  Constitutional: He is oriented to person, place, and time. He appears well-developed and well-nourished.  HENT:  Head: Normocephalic and atraumatic.  Eyes: Pupils are equal, round, and reactive to light. EOM are normal.  Neck: No JVD present. Carotid bruit is not present.   Cardiovascular: Normal rate, regular rhythm and normal heart sounds.   No murmur heard. Pulmonary/Chest: Effort normal and breath sounds normal. He has no rales.  Musculoskeletal: He exhibits edema (trace pedal edema bilaterally).  Neurological: He is alert and oriented to person, place, and time.  Skin: Skin is warm and dry.  Psychiatric: He has a normal mood and affect.  Vitals reviewed.   Vitals:   09/13/16 1606  BP: 128/74  Pulse: 67  Resp: 16  Temp: 98.3 F (36.8 C)  TempSrc: Oral  SpO2: 94%  Weight: 256 lb (116.1 kg)  Height: '5\' 4"'  (1.626 m)      Assessment & Plan:    Jule Schlabach is a 56 y.o. male Type 2 diabetes mellitus without complication, without  long-term current use of insulin (HCC) - Plan: Microalbumin, urine, Hemoglobin A1c, Amb Ref to Medical Weight Management, metFORMIN (GLUCOPHAGE) 500 MG tablet  - Continue metformin same dose, check A1c, urine microalbumin.  Hyperlipidemia, unspecified hyperlipidemia type - Plan: Comprehensive metabolic panel, Lipid panel  - Check labs, continue Lipitor same dose.  Essential hypertension - Plan: losartan (COZAAR) 50 MG tablet  - Stable, controlled with goal less than 130/80 with diabetes. Continue losartan same dose   Screen for colon cancer - Plan: Ambulatory referral to Gastroenterology  - Due for colonoscopy, referral placed  Class 3 obesity with serious comorbidity and body mass index (BMI) of 40.0 to 44.9 in adult, unspecified obesity type ([place]) - Plan: Amb Ref to Medical Weight Management  - Difficulty with weight loss, somewhat limited by his schedule. We'll refer to medical weight specialist.  - Discussed continued attempts at decreasing large meal before bedtime, and spreading out calories throughout the day with healthy snacks and packing meals for better options.   Meds ordered this encounter  Medications  . atorvastatin (LIPITOR) 10 MG tablet    Sig: Take 1 tablet (10 mg total) by mouth daily at 6 PM.     Dispense:  90 tablet    Refill:  1  . losartan (COZAAR) 50 MG tablet    Sig: Take 1 tablet (50 mg total) by mouth every morning.    Dispense:  90 tablet    Refill:  1  . metFORMIN (GLUCOPHAGE) 500 MG tablet    Sig: Take 1 tablet (500 mg total) by mouth daily with breakfast.    Dispense:  90 tablet    Refill:  1   Patient Instructions   Start Lipitor once per day for cholesterol. Recheck cholesterol levels and liver tests in the next 2-3 months. Continue same dose of losartan and metformin for now. I did refer you to a medical bariatric specialist to help with weight loss. Follow-up with me in 3 months, let me know if there are questions in the meantime.  If the side pain returns, return here or the emergency room if needed.    IF you received an x-Garner today, you will receive an invoice from [facility]. Please contact [facility] at [phone] with questions or concerns regarding your invoice.   IF you received labwork today, you will receive an invoice from [place]. Please contact [facility] at [phone] with questions or concerns regarding your invoice.   Our billing staff will not be able to assist you with questions regarding bills from these companies.  You will be contacted with the lab results as soon as they are available. The fastest way to get your results is to activate your My Chart account. Instructions are located on the last page of this paperwork. If you have not heard from [name] regarding the results in 2 weeks, please contact this office.       I personally performed the services described in this documentation, which was scribed in my presence. The recorded information has been reviewed and considered for accuracy and completeness, addended by me as needed, and agree with information above.  Signed,   Matthew Ray, MD [facility].  09/15/16 3:22 PM

## 2016-09-13 NOTE — Patient Instructions (Addendum)
Start Lipitor once per day for cholesterol. Recheck cholesterol levels and liver tests in the next 2-3 months. Continue same dose of losartan and metformin for now. I did refer you to a medical bariatric specialist to help with weight loss. Follow-up with me in 3 months, let me know if there are questions in the meantime.  If the side pain returns, return here or the emergency room if needed.    IF you received an x-ray today, you will receive an invoice from [facility]. Please contact [facility] at [phone] with questions or concerns regarding your invoice.   IF you received labwork today, you will receive an invoice from [facility]. Please contact [facility] at [phone] with questions or concerns regarding your invoice.   Our billing staff will not be able to assist you with questions regarding bills from these companies.  You will be contacted with the lab results as soon as they are available. The fastest way to get your results is to activate your My Chart account. Instructions are located on the last page of this paperwork. If you have not heard from [name] regarding the results in 2 weeks, please contact this office.

## 2016-09-14 LAB — COMPREHENSIVE METABOLIC PANEL
A/G RATIO: 1.8 (ref 1.2–2.2)
ALBUMIN: 4.6 g/dL (ref 3.5–5.5)
ALK PHOS: 91 IU/L (ref 39–117)
ALT: 24 IU/L (ref 0–44)
AST: 17 IU/L (ref 0–40)
BUN / CREAT RATIO: 15 (ref 9–20)
BUN: 11 mg/dL (ref 6–24)
Bilirubin Total: 0.4 mg/dL (ref 0.0–1.2)
CO2: 25 mmol/L (ref 20–29)
Calcium: 9.8 mg/dL (ref 8.7–10.2)
Chloride: 100 mmol/L (ref 96–106)
Creatinine, Ser: 0.71 mg/dL — ABNORMAL LOW (ref 0.76–1.27)
GFR calc Af Amer: 124 mL/min/{1.73_m2} (ref >59)
GFR, EST NON AFRICAN AMERICAN: 107 mL/min/{1.73_m2} (ref >59)
GLOBULIN, TOTAL: 2.6 g/dL (ref 1.5–4.5)
Glucose: 109 mg/dL — ABNORMAL HIGH (ref 65–99)
POTASSIUM: 4.7 mmol/L (ref 3.5–5.2)
SODIUM: 140 mmol/L (ref 134–144)
Total Protein: 7.2 g/dL (ref 6.0–8.5)

## 2016-09-14 LAB — LIPID PANEL
CHOL/HDL RATIO: 3 ratio (ref 0.0–5.0)
Cholesterol, Total: 211 mg/dL — ABNORMAL HIGH (ref 100–199)
HDL: 70 mg/dL (ref >39)
LDL Calculated: 126 mg/dL — ABNORMAL HIGH (ref 0–99)
TRIGLYCERIDES: 73 mg/dL (ref 0–149)
VLDL Cholesterol Cal: 15 mg/dL (ref 5–40)

## 2016-09-14 LAB — HEMOGLOBIN A1C
ESTIMATED AVERAGE GLUCOSE: 157 mg/dL
HEMOGLOBIN A1C: 7.1 % — AB (ref 4.8–5.6)

## 2016-09-14 LAB — MICROALBUMIN, URINE: Microalbumin, Urine: 38.5 ug/mL

## 2016-09-24 ENCOUNTER — Other Ambulatory Visit: Payer: Self-pay | Admitting: *Deleted

## 2016-09-24 ENCOUNTER — Telehealth: Payer: Self-pay | Admitting: Family Medicine

## 2016-09-24 DIAGNOSIS — K219 Gastro-esophageal reflux disease without esophagitis: Secondary | ICD-10-CM

## 2016-09-24 MED ORDER — RANITIDINE HCL 300 MG PO TABS: 300.0000 mg | ORAL_TABLET | Freq: Every day | ORAL | 0 refills | Status: DC

## 2016-09-24 NOTE — Telephone Encounter (Signed)
Pt pharmacy is checking on the status of his refill request for ranitidine   Order number is [id number]   [phone]

## 2016-09-25 ENCOUNTER — Other Ambulatory Visit: Payer: Self-pay

## 2016-09-25 DIAGNOSIS — K219 Gastro-esophageal reflux disease without esophagitis: Secondary | ICD-10-CM

## 2016-09-25 MED ORDER — RANITIDINE HCL 300 MG PO TABS: 300.0000 mg | ORAL_TABLET | Freq: Every day | ORAL | 0 refills | Status: DC

## 2016-09-27 DIAGNOSIS — I83203 Varicose veins of unspecified lower extremity with both ulcer of ankle and inflammation: Secondary | ICD-10-CM | POA: Diagnosis not present

## 2016-09-27 DIAGNOSIS — I8312 Varicose veins of left lower extremity with inflammation: Secondary | ICD-10-CM | POA: Diagnosis not present

## 2016-09-27 DIAGNOSIS — I8311 Varicose veins of right lower extremity with inflammation: Secondary | ICD-10-CM | POA: Diagnosis not present

## 2016-10-02 DIAGNOSIS — Z23 Encounter for immunization: Secondary | ICD-10-CM | POA: Diagnosis not present

## 2016-10-23 ENCOUNTER — Other Ambulatory Visit: Payer: Self-pay | Admitting: Family Medicine

## 2016-10-23 DIAGNOSIS — K219 Gastro-esophageal reflux disease without esophagitis: Secondary | ICD-10-CM

## 2016-11-09 DIAGNOSIS — I83203 Varicose veins of unspecified lower extremity with both ulcer of ankle and inflammation: Secondary | ICD-10-CM | POA: Diagnosis not present

## 2016-11-09 DIAGNOSIS — I83892 Varicose veins of left lower extremities with other complications: Secondary | ICD-10-CM | POA: Diagnosis not present

## 2016-11-09 DIAGNOSIS — I8312 Varicose veins of left lower extremity with inflammation: Secondary | ICD-10-CM | POA: Diagnosis not present

## 2016-11-12 DIAGNOSIS — I8312 Varicose veins of left lower extremity with inflammation: Secondary | ICD-10-CM | POA: Diagnosis not present

## 2016-11-14 DIAGNOSIS — M170 Bilateral primary osteoarthritis of knee: Secondary | ICD-10-CM | POA: Diagnosis not present

## 2016-11-16 DIAGNOSIS — I83892 Varicose veins of left lower extremities with other complications: Secondary | ICD-10-CM | POA: Diagnosis not present

## 2016-11-16 DIAGNOSIS — I8312 Varicose veins of left lower extremity with inflammation: Secondary | ICD-10-CM | POA: Diagnosis not present

## 2016-11-26 ENCOUNTER — Encounter: Payer: Self-pay | Admitting: Family Medicine

## 2016-11-29 DIAGNOSIS — I83892 Varicose veins of left lower extremities with other complications: Secondary | ICD-10-CM | POA: Diagnosis not present

## 2016-11-29 DIAGNOSIS — I8312 Varicose veins of left lower extremity with inflammation: Secondary | ICD-10-CM | POA: Diagnosis not present

## 2016-12-13 ENCOUNTER — Other Ambulatory Visit: Payer: Self-pay

## 2016-12-13 ENCOUNTER — Other Ambulatory Visit: Payer: Self-pay | Admitting: Family Medicine

## 2016-12-13 ENCOUNTER — Encounter: Payer: Self-pay | Admitting: Family Medicine

## 2016-12-13 ENCOUNTER — Ambulatory Visit (INDEPENDENT_AMBULATORY_CARE_PROVIDER_SITE_OTHER): Payer: 59 | Admitting: Family Medicine

## 2016-12-13 VITALS — BP 130/72 | HR 72 | Temp 98.3°F | Resp 18 | Ht 64.0 in | Wt 254.2 lb

## 2016-12-13 DIAGNOSIS — I10 Essential (primary) hypertension: Secondary | ICD-10-CM

## 2016-12-13 DIAGNOSIS — E785 Hyperlipidemia, unspecified: Secondary | ICD-10-CM

## 2016-12-13 DIAGNOSIS — N529 Male erectile dysfunction, unspecified: Secondary | ICD-10-CM

## 2016-12-13 DIAGNOSIS — E119 Type 2 diabetes mellitus without complications: Secondary | ICD-10-CM | POA: Diagnosis not present

## 2016-12-13 DIAGNOSIS — K219 Gastro-esophageal reflux disease without esophagitis: Secondary | ICD-10-CM

## 2016-12-13 MED ORDER — METFORMIN HCL 500 MG PO TABS: 500.0000 mg | ORAL_TABLET | Freq: Every day | ORAL | 1 refills | Status: DC

## 2016-12-13 MED ORDER — SILDENAFIL CITRATE 100 MG PO TABS: 25.0000 mg | ORAL_TABLET | Freq: Every day | ORAL | 2 refills | Status: DC | PRN

## 2016-12-13 MED ORDER — LOSARTAN POTASSIUM 50 MG PO TABS: 50.0000 mg | ORAL_TABLET | Freq: Every morning | ORAL | 1 refills | Status: DC

## 2016-12-13 MED ORDER — ATORVASTATIN CALCIUM 10 MG PO TABS: 10.0000 mg | ORAL_TABLET | Freq: Every day | ORAL | 1 refills | Status: DC

## 2016-12-13 NOTE — Patient Instructions (Addendum)
Continue to watch diet. I will check diabetes test, and cholesterol levels.   Try 1/4 -1/2 pill of Viagra if needed. If you continue to need that medication in next 6-8 weeks, return to discuss other causes.  Return to the clinic or go to the nearest emergency room if any of your symptoms worsen or new symptoms occur.  Erectile Dysfunction Erectile dysfunction (ED) is the inability to get or keep an erection in order to have sexual intercourse. Erectile dysfunction may include:  Inability to get an erection.  Lack of enough hardness of the erection to allow penetration.  Loss of the erection before sex is finished.  What are the causes? This condition may be caused by:  Certain medicines, such as: ? Pain relievers. ? Antihistamines. ? Antidepressants. ? Blood pressure medicines. ? Water pills (diuretics). ? Ulcer medicines. ? Muscle relaxants. ? Drugs.  Excessive drinking.  Psychological causes, such as: ? Anxiety. ? Depression. ? Sadness. ? Exhaustion. ? Performance fear. ? Stress.  Physical causes, such as: ? Artery problems. This may include diabetes, smoking, liver disease, or atherosclerosis. ? High blood pressure. ? Hormonal problems, such as low testosterone. ? Obesity. ? Nerve problems. This may include back or pelvic injuries, diabetes mellitus, multiple sclerosis, or Parkinson disease.  What are the signs or symptoms? Symptoms of this condition include:  Inability to get an erection.  Lack of enough hardness of the erection to allow penetration.  Loss of the erection before sex is finished.  Normal erections at some times, but with frequent unsatisfactory episodes.  Low sexual satisfaction in either partner due to erection problems.  A curved penis occurring with erection. The curve may cause pain or the penis may be too curved to allow for intercourse.  Never having nighttime erections.  How is this diagnosed? This condition is often diagnosed  by:  Performing a physical exam to find other diseases or specific problems with the penis.  Asking you detailed questions about the problem.  Performing blood tests to check for diabetes mellitus or to measure hormone levels.  Performing other tests to check for underlying health conditions.  Performing an ultrasound exam to check for scarring.  Performing a test to check blood flow to the penis.  Doing a sleep study at home to measure nighttime erections.  How is this treated? This condition may be treated by:  Medicine taken by mouth to help you achieve an erection (oral medicine).  Hormone replacement therapy to replace low testosterone levels.  Medicine that is injected into the penis. Your health care provider may instruct you how to give yourself these injections at home.  Vacuum pump. This is a pump with a ring on it. The pump and ring are placed on the penis and used to create pressure that helps the penis become erect.  Penile implant surgery. In this procedure, you may receive: ? An inflatable implant. This consists of cylinders, a pump, and a reservoir. The cylinders can be inflated with a fluid that helps to create an erection, and they can be deflated after intercourse. ? A semi-rigid implant. This consists of two silicone rubber rods. The rods provide some rigidity. They are also flexible, so the penis can both curve downward in its normal position and become straight for sexual intercourse.  Blood vessel surgery, to improve blood flow to the penis. During this procedure, a blood vessel from a different part of the body is placed into the penis to allow blood to flow around (  bypass) damaged or blocked blood vessels.  Lifestyle changes, such as exercising more, losing weight, and quitting smoking.  Follow these instructions at home: Medicines  Take over-the-counter and prescription medicines only as told by your health care provider. Do not increase the dosage  without first discussing it with your health care provider.  If you are using self-injections, perform injections as directed by your health care provider. Make sure to avoid any veins that are on the surface of the penis. After giving an injection, apply pressure to the injection site for 5 minutes. General instructions  Exercise regularly, as directed by your health care provider. Work with your health care provider to lose weight, if needed.  Do not use any products that contain nicotine or tobacco, such as cigarettes and e-cigarettes. If you need help quitting, ask your health care provider.  Before using a vacuum pump, read the instructions that come with the pump and discuss any questions with your health care provider.  Keep all follow-up visits as told by your health care provider. This is important. Contact a health care provider if:  You feel nauseous.  You vomit. Get help right away if:  You are taking oral or injectable medicines and you have an erection that lasts longer than 4 hours. If your health care provider is unavailable, go to the nearest emergency room for evaluation. An erection that lasts much longer than 4 hours can result in permanent damage to your penis.  You have severe pain in your groin or abdomen.  You develop redness or severe swelling of your penis.  You have redness spreading up into your groin or lower abdomen.  You are unable to urinate.  You experience chest pain or a rapid heart beat (palpitations) after taking oral medicines. Summary  Erectile dysfunction (ED) is the inability to get or keep an erection during sexual intercourse. This problem can usually be treated successfully.  This condition is diagnosed based on a physical exam, your symptoms, and tests to determine the cause. Treatment varies depending on the cause, and may include medicines, hormone therapy, surgery, or vacuum pump.  You may need follow-up visits to make sure that you  are using your medicines or devices correctly.  Get help right away if you are taking or injecting medicines and you have an erection that lasts longer than 4 hours. This information is not intended to replace advice given to you by your health care provider. Make sure you discuss any questions you have with your health care provider. Document Released: 12/16/1999 Document Revised: 01/04/2016 Document Reviewed: 01/04/2016 Elsevier Interactive Patient Education  2017 [employer].   IF you received an x-ray today, you will receive an invoice from [facility]. Please contact [facility] at [phone] with questions or concerns regarding your invoice.   IF you received labwork today, you will receive an invoice from [facility]. Please contact [facility] at [phone] with questions or concerns regarding your invoice.   Our billing staff will not be able to assist you with questions regarding bills from these companies.  You will be contacted with the lab results as soon as they are available. The fastest way to get your results is to activate your My Chart account. Instructions are located on the last page of this paperwork. If you have not heard from [name] regarding the results in 2 weeks, please contact this office.

## 2016-12-13 NOTE — Progress Notes (Signed)
Subjective:  By signing my name below, I, [name], attest that this documentation has been prepared under the direction and in the presence of [name], MD. Electronically Signed: [name], [place]. 12/13/2016 , 3:56 PM .  Patient was seen in Room 11 .   Patient ID: Matthew Garner, male    DOB: [birth year], 56 y.o.   MRN: [id number] Chief Complaint  Patient presents with  . Diabetes    follow up   . Medication Refill    lipitor, cozaar, and zantac    HPI Matthew Garner is a 56 y.o. male Here for follow up and medication refill.   Diabetes Lab Results  Component Value Date   HGBA1C 7.1 (H) 09/13/2016   He had Microalbumin of 38.5 on Sept 13th.   He's taking metformin 500mg  QD. He denies abdominal pain, distension or diarrhea. He denies chest pain, shortness of breath, blood in stool, dark tarry stools, lightheadedness, or dizziness.   Vision: Last saw eye doctor in Sept 2018; was given reading glasses.   Wt Readings from Last 3 Encounters:  12/13/16 254 lb 3.2 oz (115.3 kg)  09/13/16 256 lb (116.1 kg)  06/07/16 248 lb (112.5 kg)   Diet: He states he's been watching his diet. He was referred to bariatrics, but they informed unable to see him until February.   Exericse: He denies regular exercise, as he works all day. He lifts boxes for work, and stands all day.   Hyperlipidemia Lab Results  Component Value Date   CHOL 211 (H) 09/13/2016   HDL 70 09/13/2016   LDLCALC 126 (H) 09/13/2016   TRIG 73 09/13/2016   CHOLHDL 3.0 09/13/2016   He takes Lipitor 10mg  QD. He denies any new muscle aches.   Erectile dysfunction He's noticed for the past month and a half. He mentions some stress at home, but marriage is going okay. He's able to obtain erection, but difficulty maintaining sufficiently for intercourse. He denies any incontinence, saddle anesthesia, leg weakness; but he does have history of spinal stenosis.   Health Maintenance Colonoscopy: He called for an  appointment, but hasn't set it up yet because he doesn't have someone to drive him home after procedure.   LLE Vein surgery: He informs having vein surgery done in his left leg. He is starting to taking medication for his right leg next week.   Patient Active Problem List   Diagnosis Date Noted  . Knee pain, bilateral 03/29/2015  . Nonspecific abnormal electrocardiogram (ECG) (EKG) 04/21/2014  . Spinal stenosis of lumbar region 10/27/2013  . Pain of left calf 06/16/2013  . Diabetes ([place]) 11/27/2011  . Hyperlipidemia 11/27/2011  . Hypertension 11/27/2011   Past Medical History:  Diagnosis Date  . Anemia   . Diabetes mellitus   . Environmental allergies 11/27/2011  . GERD (gastroesophageal reflux disease)   . Hyperlipidemia   . Hypertension   . Sebaceous cyst 11/10/2012  . Snores    Past Surgical History:  Procedure Laterality Date  . APPENDECTOMY    . MASS EXCISION Bilateral 11/24/2012   Procedure: EXCISION SCALP MASS X2;  Surgeon: [name], MD;  Location: [facility];  Service: General;  Laterality: Bilateral;  . SPINE SURGERY  2004   lumb   No Known Allergies Prior to Admission medications   Medication Sig Start Date End Date Taking? Authorizing Provider  atorvastatin (LIPITOR) 10 MG tablet Take 1 tablet (10 mg total) by mouth daily at 6 PM. 09/13/16  Yes  Wendie Agreste, MD  losartan (COZAAR) 50 MG tablet Take 1 tablet (50 mg total) by mouth every morning. 09/13/16  Yes Wendie Agreste, MD  metFORMIN (GLUCOPHAGE) 500 MG tablet Take 1 tablet (500 mg total) by mouth daily with breakfast. 09/13/16  Yes Wendie Agreste, MD  ranitidine (ZANTAC) 300 MG tablet Take 1 tablet (300 mg total) by mouth at bedtime. 09/25/16  Yes Wendie Agreste, MD  traMADol (ULTRAM) 50 MG tablet Take 50 mg by mouth every 6 (six) hours as needed.   Yes [provider]   Social History   Socioeconomic History  . Marital status: Married    Spouse name: Not on file   . Number of children: Not on file  . Years of education: Not on file  . Highest education level: Not on file  Social Needs  . Financial resource strain: Not on file  . Food insecurity - worry: Not on file  . Food insecurity - inability: Not on file  . Transportation needs - medical: Not on file  . Transportation needs - non-medical: Not on file  Occupational History  . Not on file  Tobacco Use  . Smoking status: Never Smoker  . Smokeless tobacco: Never Used  Substance and Sexual Activity  . Alcohol use: No  . Drug use: No  . Sexual activity: Not on file  Other Topics Concern  . Not on file  Social History Narrative  . Not on file   Review of Systems  Constitutional: Negative for fatigue and unexpected weight change.  Eyes: Negative for visual disturbance.  Respiratory: Negative for cough, chest tightness and shortness of breath.   Cardiovascular: Negative for chest pain, palpitations and leg swelling.  Gastrointestinal: Negative for abdominal distention, abdominal pain and blood in stool.  Musculoskeletal: Negative for myalgias.  Neurological: Negative for dizziness, light-headedness and headaches.       Objective:   Physical Exam  Constitutional: He is oriented to person, place, and time. He appears well-developed and well-nourished.  HENT:  Head: Normocephalic and atraumatic.  Eyes: EOM are normal. Pupils are equal, round, and reactive to light.  Neck: No JVD present. Carotid bruit is not present.  Cardiovascular: Normal rate, regular rhythm and normal heart sounds.  No murmur heard. Pulmonary/Chest: Effort normal and breath sounds normal. He has no rales.  Musculoskeletal: He exhibits no edema.  Neurological: He is alert and oriented to person, place, and time.  Skin: Skin is warm and dry.  Psychiatric: He has a normal mood and affect.  Vitals reviewed.   Vitals:   12/13/16 1519  BP: 130/72  Pulse: 72  Resp: 18  Temp: 98.3 F (36.8 C)  TempSrc: Oral    SpO2: 97%  Weight: 254 lb 3.2 oz (115.3 kg)  Height: 5\' 4"  (1.626 m)      Assessment & Plan:    Matthew Garner is a 56 y.o. male Hyperlipidemia, unspecified hyperlipidemia type - Plan: Hemoglobin A1c, Comprehensive metabolic panel, atorvastatin (LIPITOR) 10 MG tablet  - Tolerating Lipitor, check CMP, lipid panel on current dose.  Diabetes mellitus without complication ([place]) - Plan: metFORMIN (GLUCOPHAGE) 500 MG tablet Type 2 diabetes mellitus without complication, without long-term current use of insulin (HCC) - Plan: Lipid panel, metFORMIN (GLUCOPHAGE) 500 MG tablet  -Borderline control previously. Recheck A1c, continue to watch diet, exercise has been limited due to work schedule. Other resources provided last visit including meeting with bariatric specialist.  Essential hypertension - Plan: losartan (COZAAR) 50 MG tablet  -  Stable, continue same dose losartan as tolerating  Erectile dysfunction, unspecified erectile dysfunction type - Plan: sildenafil (VIAGRA) 100 MG tablet  -History of spinal stenosis, but denies any other red flag symptoms including weakness in lower extremities, saddle anesthesia, or incontinence. Some possible marital stress/stress at home. Also with diabetes and hypertension, may also be contributory.  -Decided to try Viagra initially, follow-up in 6-8 weeks if still requiring that medication to consider testosterone testing or other evaluation  - viagra Rx given - use lowest effective dose. Side effects discussed (including but not limited to headache/flushing, blue discoloration of vision, possible vascular steal and risk of cardiac effects if underlying unknown coronary artery disease, and permanent sensorineural hearing loss). Understanding expressed.   Meds ordered this encounter  Medications  . atorvastatin (LIPITOR) 10 MG tablet    Sig: Take 1 tablet (10 mg total) by mouth daily at 6 PM.    Dispense:  90 tablet    Refill:  1  . metFORMIN (GLUCOPHAGE) 500 MG  tablet    Sig: Take 1 tablet (500 mg total) by mouth daily with breakfast.    Dispense:  90 tablet    Refill:  1  . losartan (COZAAR) 50 MG tablet    Sig: Take 1 tablet (50 mg total) by mouth every morning.    Dispense:  90 tablet    Refill:  1  . sildenafil (VIAGRA) 100 MG tablet    Sig: Take 0.5 tablets (50 mg total) by mouth daily as needed for erectile dysfunction.    Dispense:  5 tablet    Refill:  2   Patient Instructions   Continue to watch diet. I will check diabetes test, and cholesterol levels.   Try 1/4 -1/2 pill of Viagra if needed. If you continue to need that medication in next 6-8 weeks, return to discuss other causes.  Return to the clinic or go to the nearest emergency room if any of your symptoms worsen or new symptoms occur.  Erectile Dysfunction Erectile dysfunction (ED) is the inability to get or keep an erection in order to have sexual intercourse. Erectile dysfunction may include:  Inability to get an erection.  Lack of enough hardness of the erection to allow penetration.  Loss of the erection before sex is finished.  What are the causes? This condition may be caused by:  Certain medicines, such as: ? Pain relievers. ? Antihistamines. ? Antidepressants. ? Blood pressure medicines. ? Water pills (diuretics). ? Ulcer medicines. ? Muscle relaxants. ? Drugs.  Excessive drinking.  Psychological causes, such as: ? Anxiety. ? Depression. ? Sadness. ? Exhaustion. ? Performance fear. ? Stress.  Physical causes, such as: ? Artery problems. This may include diabetes, smoking, liver disease, or atherosclerosis. ? High blood pressure. ? Hormonal problems, such as low testosterone. ? Obesity. ? Nerve problems. This may include back or pelvic injuries, diabetes mellitus, multiple sclerosis, or Parkinson disease.  What are the signs or symptoms? Symptoms of this condition include:  Inability to get an erection.  Lack of enough hardness of the  erection to allow penetration.  Loss of the erection before sex is finished.  Normal erections at some times, but with frequent unsatisfactory episodes.  Low sexual satisfaction in either partner due to erection problems.  A curved penis occurring with erection. The curve may cause pain or the penis may be too curved to allow for intercourse.  Never having nighttime erections.  How is this diagnosed? This condition is often diagnosed by:  Performing a physical exam to find other diseases or specific problems with the penis.  Asking you detailed questions about the problem.  Performing blood tests to check for diabetes mellitus or to measure hormone levels.  Performing other tests to check for underlying health conditions.  Performing an ultrasound exam to check for scarring.  Performing a test to check blood flow to the penis.  Doing a sleep study at home to measure nighttime erections.  How is this treated? This condition may be treated by:  Medicine taken by mouth to help you achieve an erection (oral medicine).  Hormone replacement therapy to replace low testosterone levels.  Medicine that is injected into the penis. Your health care provider may instruct you how to give yourself these injections at home.  Vacuum pump. This is a pump with a ring on it. The pump and ring are placed on the penis and used to create pressure that helps the penis become erect.  Penile implant surgery. In this procedure, you may receive: ? An inflatable implant. This consists of cylinders, a pump, and a reservoir. The cylinders can be inflated with a fluid that helps to create an erection, and they can be deflated after intercourse. ? A semi-rigid implant. This consists of two silicone rubber rods. The rods provide some rigidity. They are also flexible, so the penis can both curve downward in its normal position and become straight for sexual intercourse.  Blood vessel surgery, to improve  blood flow to the penis. During this procedure, a blood vessel from a different part of the body is placed into the penis to allow blood to flow around (bypass) damaged or blocked blood vessels.  Lifestyle changes, such as exercising more, losing weight, and quitting smoking.  Follow these instructions at home: Medicines  Take over-the-counter and prescription medicines only as told by your health care provider. Do not increase the dosage without first discussing it with your health care provider.  If you are using self-injections, perform injections as directed by your health care provider. Make sure to avoid any veins that are on the surface of the penis. After giving an injection, apply pressure to the injection site for 5 minutes. General instructions  Exercise regularly, as directed by your health care provider. Work with your health care provider to lose weight, if needed.  Do not use any products that contain nicotine or tobacco, such as cigarettes and e-cigarettes. If you need help quitting, ask your health care provider.  Before using a vacuum pump, read the instructions that come with the pump and discuss any questions with your health care provider.  Keep all follow-up visits as told by your health care provider. This is important. Contact a health care provider if:  You feel nauseous.  You vomit. Get help right away if:  You are taking oral or injectable medicines and you have an erection that lasts longer than 4 hours. If your health care provider is unavailable, go to the nearest emergency room for evaluation. An erection that lasts much longer than 4 hours can result in permanent damage to your penis.  You have severe pain in your groin or abdomen.  You develop redness or severe swelling of your penis.  You have redness spreading up into your groin or lower abdomen.  You are unable to urinate.  You experience chest pain or a rapid heart beat (palpitations) after  taking oral medicines. Summary  Erectile dysfunction (ED) is the inability to get or keep an  erection during sexual intercourse. This problem can usually be treated successfully.  This condition is diagnosed based on a physical exam, your symptoms, and tests to determine the cause. Treatment varies depending on the cause, and may include medicines, hormone therapy, surgery, or vacuum pump.  You may need follow-up visits to make sure that you are using your medicines or devices correctly.  Get help right away if you are taking or injecting medicines and you have an erection that lasts longer than 4 hours. This information is not intended to replace advice given to you by your health care provider. Make sure you discuss any questions you have with your health care provider. Document Released: 12/16/1999 Document Revised: 01/04/2016 Document Reviewed: 01/04/2016 Elsevier Interactive Patient Education  2017 [employer].   IF you received an x-ray today, you will receive an invoice from [facility]. Please contact [facility] at [phone] with questions or concerns regarding your invoice.   IF you received labwork today, you will receive an invoice from [facility]. Please contact [facility] at [phone] with questions or concerns regarding your invoice.   Our billing staff will not be able to assist you with questions regarding bills from these companies.  You will be contacted with the lab results as soon as they are available. The fastest way to get your results is to activate your My Chart account. Instructions are located on the last page of this paperwork. If you have not heard from [name] regarding the results in 2 weeks, please contact this office.      I personally performed the services described in this documentation, which was scribed in my presence. The recorded information has been reviewed and considered for accuracy and completeness, addended by me as needed, and  agree with information above.  Signed,   [name], MD [facility].  12/13/16 4:16 PM

## 2016-12-14 LAB — COMPREHENSIVE METABOLIC PANEL
A/G RATIO: 1.4 (ref 1.2–2.2)
ALT: 24 IU/L (ref 0–44)
AST: 43 IU/L — ABNORMAL HIGH (ref 0–40)
Albumin: 4.2 g/dL (ref 3.5–5.5)
Alkaline Phosphatase: 86 IU/L (ref 39–117)
BILIRUBIN TOTAL: 0.4 mg/dL (ref 0.0–1.2)
BUN / CREAT RATIO: 21 — AB (ref 9–20)
BUN: 16 mg/dL (ref 6–24)
CHLORIDE: 99 mmol/L (ref 96–106)
CO2: 26 mmol/L (ref 20–29)
Calcium: 9.4 mg/dL (ref 8.7–10.2)
Creatinine, Ser: 0.75 mg/dL — ABNORMAL LOW (ref 0.76–1.27)
GFR calc non Af Amer: 104 mL/min/{1.73_m2} (ref >59)
GFR, EST AFRICAN AMERICAN: 120 mL/min/{1.73_m2} (ref >59)
GLOBULIN, TOTAL: 3 g/dL (ref 1.5–4.5)
Glucose: 110 mg/dL — ABNORMAL HIGH (ref 65–99)
POTASSIUM: 4.6 mmol/L (ref 3.5–5.2)
SODIUM: 140 mmol/L (ref 134–144)
TOTAL PROTEIN: 7.2 g/dL (ref 6.0–8.5)

## 2016-12-14 LAB — LIPID PANEL
Chol/HDL Ratio: 2.6 ratio (ref 0.0–5.0)
Cholesterol, Total: 161 mg/dL (ref 100–199)
HDL: 63 mg/dL (ref >39)
LDL Calculated: 89 mg/dL (ref 0–99)
Triglycerides: 45 mg/dL (ref 0–149)
VLDL Cholesterol Cal: 9 mg/dL (ref 5–40)

## 2016-12-14 LAB — HEMOGLOBIN A1C
ESTIMATED AVERAGE GLUCOSE: 148 mg/dL
Hgb A1c MFr Bld: 6.8 % — ABNORMAL HIGH (ref 4.8–5.6)

## 2016-12-18 DIAGNOSIS — I8311 Varicose veins of right lower extremity with inflammation: Secondary | ICD-10-CM | POA: Diagnosis not present

## 2016-12-24 ENCOUNTER — Encounter: Payer: Self-pay | Admitting: *Deleted

## 2016-12-27 DIAGNOSIS — I8311 Varicose veins of right lower extremity with inflammation: Secondary | ICD-10-CM | POA: Diagnosis not present

## 2016-12-28 ENCOUNTER — Telehealth: Payer: Self-pay | Admitting: Family Medicine

## 2016-12-28 NOTE — Telephone Encounter (Unsigned)
Copied from [place] [phone]. Topic: Inquiry >> Dec 28, 2016  2:11 PM Neva Seat wrote: Pt is wanting results from blood sugar test on 12-13-16

## 2017-01-02 NOTE — Telephone Encounter (Signed)
IC pt. LMOVM - letter was sent with all results

## 2017-01-25 ENCOUNTER — Other Ambulatory Visit: Payer: Self-pay | Admitting: Family Medicine

## 2017-01-25 DIAGNOSIS — K219 Gastro-esophageal reflux disease without esophagitis: Secondary | ICD-10-CM

## 2017-01-31 ENCOUNTER — Encounter (INDEPENDENT_AMBULATORY_CARE_PROVIDER_SITE_OTHER): Payer: 59

## 2017-02-04 ENCOUNTER — Ambulatory Visit (INDEPENDENT_AMBULATORY_CARE_PROVIDER_SITE_OTHER): Payer: 59 | Admitting: Family Medicine

## 2017-02-04 ENCOUNTER — Encounter (INDEPENDENT_AMBULATORY_CARE_PROVIDER_SITE_OTHER): Payer: Self-pay | Admitting: Family Medicine

## 2017-02-04 VITALS — BP 136/79 | HR 58 | Temp 98.1°F | Ht 64.0 in | Wt 247.0 lb

## 2017-02-04 DIAGNOSIS — Z0289 Encounter for other administrative examinations: Secondary | ICD-10-CM

## 2017-02-04 DIAGNOSIS — I10 Essential (primary) hypertension: Secondary | ICD-10-CM

## 2017-02-04 DIAGNOSIS — R5383 Other fatigue: Secondary | ICD-10-CM | POA: Diagnosis not present

## 2017-02-04 DIAGNOSIS — Z1331 Encounter for screening for depression: Secondary | ICD-10-CM

## 2017-02-04 DIAGNOSIS — Z6841 Body Mass Index (BMI) 40.0 and over, adult: Secondary | ICD-10-CM

## 2017-02-04 DIAGNOSIS — E7849 Other hyperlipidemia: Secondary | ICD-10-CM | POA: Diagnosis not present

## 2017-02-04 DIAGNOSIS — E119 Type 2 diabetes mellitus without complications: Secondary | ICD-10-CM

## 2017-02-04 DIAGNOSIS — R0602 Shortness of breath: Secondary | ICD-10-CM

## 2017-02-04 DIAGNOSIS — Z9189 Other specified personal risk factors, not elsewhere classified: Secondary | ICD-10-CM | POA: Diagnosis not present

## 2017-02-05 LAB — COMPREHENSIVE METABOLIC PANEL
A/G RATIO: 1.7 (ref 1.2–2.2)
ALBUMIN: 4.6 g/dL (ref 3.5–5.5)
ALT: 21 IU/L (ref 0–44)
AST: 17 IU/L (ref 0–40)
Alkaline Phosphatase: 84 IU/L (ref 39–117)
BUN / CREAT RATIO: 15 (ref 9–20)
BUN: 12 mg/dL (ref 6–24)
Bilirubin Total: 0.4 mg/dL (ref 0.0–1.2)
CALCIUM: 9.7 mg/dL (ref 8.7–10.2)
CO2: 25 mmol/L (ref 20–29)
Chloride: 97 mmol/L (ref 96–106)
Creatinine, Ser: 0.79 mg/dL (ref 0.76–1.27)
GFR, EST AFRICAN AMERICAN: 118 mL/min/{1.73_m2} (ref >59)
GFR, EST NON AFRICAN AMERICAN: 102 mL/min/{1.73_m2} (ref >59)
GLOBULIN, TOTAL: 2.7 g/dL (ref 1.5–4.5)
Glucose: 113 mg/dL — ABNORMAL HIGH (ref 65–99)
POTASSIUM: 4.6 mmol/L (ref 3.5–5.2)
SODIUM: 139 mmol/L (ref 134–144)
TOTAL PROTEIN: 7.3 g/dL (ref 6.0–8.5)

## 2017-02-05 LAB — CBC WITH DIFFERENTIAL
BASOS: 1 %
Basophils Absolute: 0.1 10*3/uL (ref 0.0–0.2)
EOS (ABSOLUTE): 0.1 10*3/uL (ref 0.0–0.4)
EOS: 2 %
HEMATOCRIT: 46.1 % (ref 37.5–51.0)
HEMOGLOBIN: 16 g/dL (ref 13.0–17.7)
Immature Grans (Abs): 0 10*3/uL (ref 0.0–0.1)
Immature Granulocytes: 0 %
Lymphocytes Absolute: 1.5 10*3/uL (ref 0.7–3.1)
Lymphs: 25 %
MCH: 30.4 pg (ref 26.6–33.0)
MCHC: 34.7 g/dL (ref 31.5–35.7)
MCV: 88 fL (ref 79–97)
Monocytes Absolute: 0.4 10*3/uL (ref 0.1–0.9)
Monocytes: 7 %
Neutrophils Absolute: 4 10*3/uL (ref 1.4–7.0)
Neutrophils: 65 %
RBC: 5.27 x10E6/uL (ref 4.14–5.80)
RDW: 13.4 % (ref 12.3–15.4)
WBC: 6.2 10*3/uL (ref 3.4–10.8)

## 2017-02-05 LAB — LIPID PANEL WITH LDL/HDL RATIO
Cholesterol, Total: 145 mg/dL (ref 100–199)
HDL: 61 mg/dL (ref >39)
LDL CALC: 69 mg/dL (ref 0–99)
LDL/HDL RATIO: 1.1 ratio (ref 0.0–3.6)
TRIGLYCERIDES: 77 mg/dL (ref 0–149)
VLDL Cholesterol Cal: 15 mg/dL (ref 5–40)

## 2017-02-05 LAB — HEMOGLOBIN A1C
Est. average glucose Bld gHb Est-mCnc: 148 mg/dL
Hgb A1c MFr Bld: 6.8 % — ABNORMAL HIGH (ref 4.8–5.6)

## 2017-02-05 LAB — FOLATE: Folate: 19.6 ng/mL (ref >3.0)

## 2017-02-05 LAB — MICROALBUMIN / CREATININE URINE RATIO
CREATININE, UR: 289 mg/dL
MICROALBUM., U, RANDOM: 39.4 ug/mL
Microalb/Creat Ratio: 13.6 mg/g creat (ref 0.0–30.0)

## 2017-02-05 LAB — T4, FREE: Free T4: 1.4 ng/dL (ref 0.82–1.77)

## 2017-02-05 LAB — VITAMIN D 25 HYDROXY (VIT D DEFICIENCY, FRACTURES): VIT D 25 HYDROXY: 14.2 ng/mL — AB (ref 30.0–100.0)

## 2017-02-05 LAB — TSH: TSH: 1.62 u[IU]/mL (ref 0.450–4.500)

## 2017-02-05 LAB — INSULIN, RANDOM: INSULIN: 15.5 u[IU]/mL (ref 2.6–24.9)

## 2017-02-05 LAB — VITAMIN B12: Vitamin B-12: 453 pg/mL (ref 232–1245)

## 2017-02-05 LAB — T3: T3, Total: 135 ng/dL (ref 71–180)

## 2017-02-05 NOTE — Progress Notes (Signed)
Office: [phone]  /  Fax: [phone]   Dear Dr. Carlota Raspberry,   Thank you for referring Jerauld Bostwick to our clinic. The following note includes my evaluation and treatment recommendations.  HPI:   Chief Complaint: OBESITY    Elroy Schembri has been referred by Ranell Patrick. Carlota Raspberry, MD for consultation regarding his obesity and obesity related comorbidities.    Akshay Spang (MR# [id number]) is a 57 y.o. male who presents on 02/04/2017 for obesity evaluation and treatment. Current BMI is Body mass index is 42.4 kg/m.[name] has been struggling with his weight for many years and has been unsuccessful in either losing weight, maintaining weight loss, or reaching his healthy weight goal.     Ledford attended our information session and states he is currently in the action stage of change and ready to dedicate time achieving and maintaining a healthier weight. Riggin is interested in becoming our patient and working on intensive lifestyle modifications including (but not limited to) diet, exercise and weight loss.    Sire states his family eats meals together he thinks his family will eat healthier with  him his desired weight loss is 67 lbs he has been heavy most of  his life he has significant food cravings issues  he skips meals frequently he is frequently drinking liquids with calories he frequently eats larger portions than normal  he struggles with emotional eating    Fatigue Arael feels his energy is lower than it should be. This has worsened with weight gain and has not worsened recently. Jahkari admits to daytime somnolence and  denies waking up still tired. Patient is at risk for obstructive sleep apnea. Patent has a history of symptoms of daytime fatigue and morning headache. Patient generally gets 5 hours of sleep per night, and states they generally have generally restful sleep. Snoring is present. Apneic episodes are not present. Epworth Sleepiness Score is 4.  Dyspnea on exertion Kiptyn notes  increasing shortness of breath with exercising and seems to be worsening over time with weight gain. He notes getting out of breath sooner with activity than he used to. This has not gotten worse recently. Lang denies orthopnea.  Diabetes II Fabrizio has a diagnosis of diabetes type II. Dondre is on metformin, last A1c was 6.8. He notes polyphagia and denies any hypoglycemic episodes. He has been working on intensive lifestyle modifications including diet, exercise, and weight loss to help control his blood glucose levels.  Hyperlipidemia Nieves has hyperlipidemia and has been trying to improve his cholesterol levels with intensive lifestyle modification including a low saturated fat diet, exercise and weight loss. He is on Lipitor and denies any chest pain, claudication or myalgias. Due for labs.  Hypertension Phelix Fudala is a 57 y.o. male with hypertension. Omario is on losartan. His blood pressure is stable and he denies chest pain or headaches. He is working weight loss to help control his blood pressure with the goal of decreasing his risk of heart attack and stroke. Cam's blood pressure is currently controlled.  At risk for cardiovascular disease Ranson is at a higher than average risk for cardiovascular disease due to obesity, hyperlipidemia, and hypertension. He currently denies any chest pain.  Depression Screen Khyson's Food and Mood (modified PHQ-9) score was  Depression screen PHQ 2/9 02/04/2017  Decreased Interest 1  Down, Depressed, Hopeless 3  PHQ - 2 Score 4  Altered sleeping 3  Tired, decreased energy 3  Change in appetite 3  Feeling bad or failure about yourself  3  Trouble concentrating 3  Moving slowly or fidgety/restless 3  Suicidal thoughts 3  PHQ-9 Score 25  Difficult doing work/chores Extremely dIfficult    ALLERGIES: No Known Allergies  MEDICATIONS: Current Outpatient Medications on File Prior to Visit  Medication Sig Dispense Refill  . atorvastatin (LIPITOR) 10  MG tablet Take 1 tablet (10 mg total) by mouth daily at 6 PM. 90 tablet 1  . losartan (COZAAR) 50 MG tablet Take 1 tablet (50 mg total) by mouth every morning. 90 tablet 1  . metFORMIN (GLUCOPHAGE) 500 MG tablet Take 1 tablet (500 mg total) by mouth daily with breakfast. 90 tablet 1  . ranitidine (ZANTAC) 300 MG tablet TAKE 1 TABLET BY MOUTH AT  BEDTIME 90 tablet 0  . sildenafil (VIAGRA) 100 MG tablet Take 0.5 tablets (50 mg total) by mouth daily as needed for erectile dysfunction. 5 tablet 2   No current facility-administered medications on file prior to visit.     PAST MEDICAL HISTORY: Past Medical History:  Diagnosis Date  . Anemia   . Back pain   . Diabetes mellitus   . Environmental allergies 11/27/2011  . GERD (gastroesophageal reflux disease)   . Hyperlipidemia   . Hypertension   . Joint pain   . Knee pain, bilateral   . Sebaceous cyst 11/10/2012  . Snores     PAST SURGICAL HISTORY: Past Surgical History:  Procedure Laterality Date  . APPENDECTOMY    . KNEE SURGERY     right  . MASS EXCISION Bilateral 11/24/2012   Procedure: EXCISION SCALP MASS X2;  Surgeon: [name], MD;  Location: [facility];  Service: General;  Laterality: Bilateral;  . SPINE SURGERY  2004   lumb    SOCIAL HISTORY: Social History   Tobacco Use  . Smoking status: Never Smoker  . Smokeless tobacco: Never Used  Substance Use Topics  . Alcohol use: No  . Drug use: No    FAMILY HISTORY: Family History  Family history unknown: Yes    ROS: Review of Systems  Constitutional: Positive for malaise/fatigue. Negative for weight loss.  Respiratory: Positive for shortness of breath (with exertion).   Cardiovascular: Negative for chest pain, orthopnea and claudication.  Musculoskeletal: Negative for myalgias.  Neurological: Positive for headaches.  Psychiatric/Behavioral: Positive for depression. Negative for suicidal ideas.    PHYSICAL EXAM: Blood pressure 136/79,  pulse (!) 58, temperature 98.1 F (36.7 C), temperature source Oral, height 5\' 4"  (1.626 m), weight 247 lb (112 kg), SpO2 98 %. Body mass index is 42.4 kg/m. Physical Exam  Constitutional: He is oriented to person, place, and time. He appears well-developed and well-nourished.  HENT:  Head: Normocephalic and atraumatic.  Nose: Nose normal.  Eyes: EOM are normal. No scleral icterus.  Neck: Normal range of motion. Neck supple. No thyromegaly present.  Cardiovascular: Normal rate and regular rhythm.  Pulmonary/Chest: Effort normal. No respiratory distress.  Abdominal: Soft. There is no tenderness.  + Obesity  Musculoskeletal:  Range of Motion normal in all 4 extremities Trace edema noted in bilateral lower extremities  Neurological: He is alert and oriented to person, place, and time. Coordination normal.  Skin: Skin is warm and dry.  Psychiatric: He has a normal mood and affect. His behavior is normal.  Vitals reviewed.   RECENT LABS AND TESTS: BMET    Component Value Date/Time   NA 139 02/04/2017 1035   K 4.6 02/04/2017 1035   CL 97 02/04/2017 1035   CO2  25 02/04/2017 1035   GLUCOSE 113 (H) 02/04/2017 1035   GLUCOSE 130 (H) 08/20/2016 0926   BUN 12 02/04/2017 1035   CREATININE 0.79 02/04/2017 1035   CREATININE 0.76 09/15/2015 1525   CALCIUM 9.7 02/04/2017 1035   GFRNONAA 102 02/04/2017 1035   GFRNONAA >89 09/15/2015 1525   GFRAA 118 02/04/2017 1035   GFRAA >89 09/15/2015 1525   Lab Results  Component Value Date   HGBA1C 6.8 (H) 02/04/2017   Lab Results  Component Value Date   INSULIN 15.5 02/04/2017   CBC    Component Value Date/Time   WBC 6.2 02/04/2017 1035   WBC 7.8 08/20/2016 0926   RBC 5.27 02/04/2017 1035   RBC 5.13 08/20/2016 0926   HGB 16.0 02/04/2017 1035   HCT 46.1 02/04/2017 1035   PLT 279 08/20/2016 0926   MCV 88 02/04/2017 1035   MCH 30.4 02/04/2017 1035   MCH 30.0 08/20/2016 0926   MCHC 34.7 02/04/2017 1035   MCHC 35.5 08/20/2016 0926    RDW 13.4 02/04/2017 1035   LYMPHSABS 1.5 02/04/2017 1035   MONOABS 0.7 08/20/2016 0926   EOSABS 0.1 02/04/2017 1035   BASOSABS 0.1 02/04/2017 1035   Iron/TIBC/Ferritin/ %Sat No results found for: IRON, TIBC, FERRITIN, IRONPCTSAT Lipid Panel     Component Value Date/Time   CHOL 145 02/04/2017 1035   TRIG 77 02/04/2017 1035   HDL 61 02/04/2017 1035   CHOLHDL 2.6 12/13/2016 1632   CHOLHDL 3.0 09/15/2015 1525   VLDL 13 09/15/2015 1525   LDLCALC 69 02/04/2017 1035   Hepatic Function Panel     Component Value Date/Time   PROT 7.3 02/04/2017 1035   ALBUMIN 4.6 02/04/2017 1035   AST 17 02/04/2017 1035   ALT 21 02/04/2017 1035   ALKPHOS 84 02/04/2017 1035   BILITOT 0.4 02/04/2017 1035      Component Value Date/Time   TSH 1.620 02/04/2017 1035    ECG  shows NSR with a rate of 53 BPM INDIRECT CALORIMETER done today shows a VO2 of 229 and a REE of 1594.  His calculated basal metabolic rate is 6606 thus his basal metabolic rate is worse than expected.    ASSESSMENT AND PLAN: Other fatigue - Plan: EKG 12-Lead, Vitamin B12, CBC With Differential, Comprehensive metabolic panel, Folate, T3, T4, free, TSH, VITAMIN D 25 Hydroxy (Vit-D Deficiency, Fractures)  Shortness of breath on exertion - Plan: CBC With Differential  Type 2 diabetes mellitus without complication, without long-term current use of insulin (HCC) - Plan: Hemoglobin A1c, Insulin, random, Microalbumin / creatinine urine ratio  Other hyperlipidemia - Plan: Lipid Panel With LDL/HDL Ratio  Essential hypertension  Depression screening  At risk for heart disease  Class 3 severe obesity with serious comorbidity and body mass index (BMI) of 40.0 to 44.9 in adult, unspecified obesity type (HCC)  PLAN:  Fatigue Treyvon was informed that his fatigue may be related to obesity, depression or many other causes. Labs will be ordered, and in the meanwhile Alize has agreed to work on diet, exercise and weight loss to help with  fatigue. Proper sleep hygiene was discussed including the need for 7-8 hours of quality sleep each night. A sleep study was not ordered based on symptoms and Epworth score.  Dyspnea on exertion Demarqus's shortness of breath appears to be obesity related and exercise induced. He has agreed to work on weight loss and gradually increase exercise to treat his exercise induced shortness of breath. If Detrich follows our instructions and  loses weight without improvement of his shortness of breath, we will plan to refer to pulmonology. We will monitor this condition regularly. Yonis agrees to this plan.  Diabetes II Loren has been given extensive diabetes education by myself today including ideal fasting and post-prandial blood glucose readings, individual ideal Hgb A1c goals and hypoglycemia prevention. We discussed the importance of good blood sugar control to decrease the likelihood of diabetic complications such as nephropathy, neuropathy, limb loss, blindness, coronary artery disease, and death. We discussed the importance of intensive lifestyle modification including diet, exercise and weight loss as the first line treatment for diabetes. Jaymeson agrees to continue his diabetes medications as prescribed. We will check labs and Skye agrees to follow up with our clinic in 2 weeks.  Hyperlipidemia Rayshard was informed of the American Heart Association Guidelines emphasizing intensive lifestyle modifications as the first line treatment for hyperlipidemia. We discussed many lifestyle modifications today in depth, and Jaymz will continue to work on decreasing saturated fats such as fatty red meat, butter and many fried foods. He will also increase vegetables and lean protein in his diet and continue to work on diet, exercise, and weight loss efforts. He will continue his medications as prescribed. We will check labs and Zarian agrees to follow up with our clinic in 2 weeks.  Hypertension We discussed sodium  restriction, working on healthy weight loss, diet, and a regular exercise program as the means to achieve improved blood pressure control. Delphin agreed with this plan and agreed to follow up as directed. We will continue to monitor his blood pressure as well as his progress with the above lifestyle modifications. He will continue his medications as prescribed and will watch for signs of hypotension as he continues his lifestyle modifications. We will check labs and Aniruddh agrees to follow up with our clinic in 2 weeks.  Cardiovascular risk counselling Cheo was given extended (15 minutes) coronary artery disease prevention counseling today. He is 57 y.o. male and has risk factors for heart disease including obesity, hyperlipidemia, and hypertension. We discussed intensive lifestyle modifications today with an emphasis on specific weight loss instructions and strategies. Pt was also informed of the importance of increasing exercise and decreasing saturated fats to help prevent heart disease.  Depression Screen Eyob had a strongly positive depression screening. Depression is commonly associated with obesity and often results in emotional eating behaviors. We will monitor this closely and work on CBT to help improve the non-hunger eating patterns. Referral to Psychology may be required if no improvement is seen as he continues in our clinic.  Obesity Thurmon is currently in the action stage of change and his goal is to continue with weight loss efforts. I recommend Stephan begin the structured treatment plan as follows:  He has agreed to follow the Category 2 plan Saleem has been instructed to eventually work up to a goal of 150 minutes of combined cardio and strengthening exercise per week for weight loss and overall health benefits. We discussed the following Behavioral Modification Strategies today: increasing lean protein intake, decreasing simple carbohydrates, decrease eating out, and no skipping  meals   He was informed of the importance of frequent follow up visits to maximize his success with intensive lifestyle modifications for his multiple health conditions. He was informed we would discuss his lab results at his next visit unless there is a critical issue that needs to be addressed sooner. Conor agreed to keep his next visit at the agreed upon time to discuss  these results.    OBESITY BEHAVIORAL INTERVENTION VISIT  Today's visit was # 1 out of 22.  Starting weight: 247 lbs Starting date: 02/04/17 Today's weight : 247 lbs  Today's date: 02/04/2017 Total lbs lost to date: 0 (Patients must lose 7 lbs in the first 6 months to continue with counseling)   ASK: We discussed the diagnosis of obesity with Wallace Keller today and Nuno agreed to give [name] permission to discuss obesity behavioral modification therapy today.  ASSESS: Jaydon has the diagnosis of obesity and his BMI today is 42.38 Eeshan is in the action stage of change   ADVISE: Kaylor was educated on the multiple health risks of obesity as well as the benefit of weight loss to improve his health. He was advised of the need for long term treatment and the importance of lifestyle modifications.  AGREE: Multiple dietary modification options and treatment options were discussed and  Taurus agreed to the above obesity treatment plan.   Wilhemena Durie, am acting as transcriptionist for  Dennard Nip, MD

## 2017-02-08 NOTE — Addendum Note (Signed)
Addended by: Dennard Nip D on: 02/08/2017 09:00 AM   Modules accepted: Level of Service

## 2017-02-14 DIAGNOSIS — M170 Bilateral primary osteoarthritis of knee: Secondary | ICD-10-CM | POA: Diagnosis not present

## 2017-02-18 ENCOUNTER — Ambulatory Visit (INDEPENDENT_AMBULATORY_CARE_PROVIDER_SITE_OTHER): Payer: 59 | Admitting: Family Medicine

## 2017-02-18 VITALS — BP 128/74 | HR 52 | Temp 98.1°F | Ht 64.0 in | Wt 242.0 lb

## 2017-02-18 DIAGNOSIS — Z9189 Other specified personal risk factors, not elsewhere classified: Secondary | ICD-10-CM | POA: Diagnosis not present

## 2017-02-18 DIAGNOSIS — E119 Type 2 diabetes mellitus without complications: Secondary | ICD-10-CM

## 2017-02-18 DIAGNOSIS — E66813 Obesity, class 3: Secondary | ICD-10-CM

## 2017-02-18 DIAGNOSIS — E559 Vitamin D deficiency, unspecified: Secondary | ICD-10-CM

## 2017-02-18 DIAGNOSIS — Z6841 Body Mass Index (BMI) 40.0 and over, adult: Secondary | ICD-10-CM

## 2017-02-18 MED ORDER — VITAMIN D3 125 MCG (5000 UT) PO CAPS: 1.0000 | ORAL_CAPSULE | Freq: Once | ORAL | Status: AC

## 2017-02-18 MED ORDER — VITAMIN D (ERGOCALCIFEROL) 1.25 MG (50000 UNIT) PO CAPS: 50000.0000 [IU] | ORAL_CAPSULE | ORAL | 0 refills | Status: DC

## 2017-02-18 NOTE — Progress Notes (Signed)
Office: [phone]  /  Fax: [phone]   HPI:   Chief Complaint: OBESITY Matthew Garner is here to discuss his progress with his obesity treatment plan. He is on the Category 2 plan and is following his eating plan approximately 99.9 % of the time. He states he is exercising 0 minutes 0 times per week. [name] did very well with weight loss on his Category 2 plan. His family did not eat healthy with him. Hunger was better controlled on his diet prescription but still had evening hunger. He misses certain foods like couscous.  His weight is 242 lb (109.8 kg) today and has had a weight loss of 5 pounds over a period of 2 weeks since his last visit. He has lost 5 lbs since starting treatment with [name].  Vitamin D Deficiency Matthew Garner has a diagnosis of vitamin D deficiency. He is not on Vit D, he notes fatigue and denies nausea, vomiting or muscle weakness. We discussed Vit D prescription but he cannot eat gelatin and thinks he can find some gelatin free Vit D OTC.  At risk for osteopenia and osteoporosis Matthew Garner is at higher risk of osteopenia and osteoporosis due to vitamin D deficiency.   Diabetes II Matthew Garner has a diagnosis of diabetes type II. Matthew Garner is on metformin, last A1c at 6.8, he notes polyphagia. He didn't bring in BGs log today. He denies any hypoglycemic episodes. He has been working on intensive lifestyle modifications including diet, exercise, and weight loss to help control his blood glucose levels.  ALLERGIES: No Known Allergies  MEDICATIONS: Current Outpatient Medications on File Prior to Visit  Medication Sig Dispense Refill  . atorvastatin (LIPITOR) 10 MG tablet Take 1 tablet (10 mg total) by mouth daily at 6 PM. 90 tablet 1  . losartan (COZAAR) 50 MG tablet Take 1 tablet (50 mg total) by mouth every morning. 90 tablet 1  . metFORMIN (GLUCOPHAGE) 500 MG tablet Take 1 tablet (500 mg total) by mouth daily with breakfast. 90 tablet 1  . ranitidine (ZANTAC) 300 MG tablet TAKE 1 TABLET BY  MOUTH AT  BEDTIME 90 tablet 0  . sildenafil (VIAGRA) 100 MG tablet Take 0.5 tablets (50 mg total) by mouth daily as needed for erectile dysfunction. 5 tablet 2   No current facility-administered medications on file prior to visit.     PAST MEDICAL HISTORY: Past Medical History:  Diagnosis Date  . Anemia   . Back pain   . Diabetes mellitus   . Environmental allergies 11/27/2011  . GERD (gastroesophageal reflux disease)   . Hyperlipidemia   . Hypertension   . Joint pain   . Knee pain, bilateral   . Sebaceous cyst 11/10/2012  . Snores     PAST SURGICAL HISTORY: Past Surgical History:  Procedure Laterality Date  . APPENDECTOMY    . KNEE SURGERY     right  . MASS EXCISION Bilateral 11/24/2012   Procedure: EXCISION SCALP MASS X2;  Surgeon: [name], MD;  Location: [facility];  Service: General;  Laterality: Bilateral;  . SPINE SURGERY  2004   lumb    SOCIAL HISTORY: Social History   Tobacco Use  . Smoking status: Never Smoker  . Smokeless tobacco: Never Used  Substance Use Topics  . Alcohol use: No  . Drug use: No    FAMILY HISTORY: Family History  Family history unknown: Yes    ROS: Review of Systems  Constitutional: Positive for weight loss.  Gastrointestinal: Negative for nausea and  vomiting.  Musculoskeletal:       Negative muscle weakness  Endo/Heme/Allergies:       Positive polyphagia Negative hypoglycemia    PHYSICAL EXAM: Blood pressure 128/74, pulse (!) 52, temperature 98.1 F (36.7 C), temperature source Oral, height 5\' 4"  (1.626 m), weight 242 lb (109.8 kg), SpO2 98 %. Body mass index is 41.54 kg/m. Physical Exam  Constitutional: He is oriented to person, place, and time. He appears well-developed and well-nourished.  Cardiovascular: Normal rate.  Pulmonary/Chest: Effort normal.  Musculoskeletal: Normal range of motion.  Neurological: He is oriented to person, place, and time.  Skin: Skin is warm and dry.    Psychiatric: He has a normal mood and affect. His behavior is normal.  Vitals reviewed.   RECENT LABS AND TESTS: BMET    Component Value Date/Time   NA 139 02/04/2017 1035   K 4.6 02/04/2017 1035   CL 97 02/04/2017 1035   CO2 25 02/04/2017 1035   GLUCOSE 113 (H) 02/04/2017 1035   GLUCOSE 130 (H) 08/20/2016 0926   BUN 12 02/04/2017 1035   CREATININE 0.79 02/04/2017 1035   CREATININE 0.76 09/15/2015 1525   CALCIUM 9.7 02/04/2017 1035   GFRNONAA 102 02/04/2017 1035   GFRNONAA >89 09/15/2015 1525   GFRAA 118 02/04/2017 1035   GFRAA >89 09/15/2015 1525   Lab Results  Component Value Date   HGBA1C 6.8 (H) 02/04/2017   HGBA1C 6.8 (H) 12/13/2016   HGBA1C 7.1 (H) 09/13/2016   HGBA1C 7.0 (H) 06/07/2016   HGBA1C 6.5 (H) 12/22/2015   Lab Results  Component Value Date   INSULIN 15.5 02/04/2017   CBC    Component Value Date/Time   WBC 6.2 02/04/2017 1035   WBC 7.8 08/20/2016 0926   RBC 5.27 02/04/2017 1035   RBC 5.13 08/20/2016 0926   HGB 16.0 02/04/2017 1035   HCT 46.1 02/04/2017 1035   PLT 279 08/20/2016 0926   MCV 88 02/04/2017 1035   MCH 30.4 02/04/2017 1035   MCH 30.0 08/20/2016 0926   MCHC 34.7 02/04/2017 1035   MCHC 35.5 08/20/2016 0926   RDW 13.4 02/04/2017 1035   LYMPHSABS 1.5 02/04/2017 1035   MONOABS 0.7 08/20/2016 0926   EOSABS 0.1 02/04/2017 1035   BASOSABS 0.1 02/04/2017 1035   Iron/TIBC/Ferritin/ %Sat No results found for: IRON, TIBC, FERRITIN, IRONPCTSAT Lipid Panel     Component Value Date/Time   CHOL 145 02/04/2017 1035   TRIG 77 02/04/2017 1035   HDL 61 02/04/2017 1035   CHOLHDL 2.6 12/13/2016 1632   CHOLHDL 3.0 09/15/2015 1525   VLDL 13 09/15/2015 1525   LDLCALC 69 02/04/2017 1035   Hepatic Function Panel     Component Value Date/Time   PROT 7.3 02/04/2017 1035   ALBUMIN 4.6 02/04/2017 1035   AST 17 02/04/2017 1035   ALT 21 02/04/2017 1035   ALKPHOS 84 02/04/2017 1035   BILITOT 0.4 02/04/2017 1035      Component Value Date/Time    TSH 1.620 02/04/2017 1035  Results for JANARD, CULP (MRN [id number]) as of 02/18/2017 17:08  Ref. Range 02/04/2017 10:35  Vitamin D, 25-Hydroxy Latest Ref Range: 30.0 - 100.0 ng/mL 14.2 (L)    ASSESSMENT AND PLAN: Vitamin D deficiency  Type 2 diabetes mellitus without complication, without long-term current use of insulin (HCC)  At risk for osteoporosis  Class 3 severe obesity with serious comorbidity and body mass index (BMI) of 40.0 to 44.9 in adult, unspecified obesity type ([place])  PLAN:  Vitamin D  Deficiency Katie was informed that low vitamin D levels contributes to fatigue and are associated with obesity, breast, and colon cancer. Heaven agrees to start OTC Vit D3 5,000 daily #30 and will follow up for routine testing of vitamin D, at least 2-3 times per year. He was informed of the risk of over-replacement of vitamin D and agrees to not increase his dose unless he discusses this with [name] first. Journey agrees to follow up with our clinic in 2 weeks.  At risk for osteopenia and osteoporosis Javarie is at risk for osteopenia and osteoporsis due to his vitamin D deficiency. He was encouraged to take his vitamin D and follow his higher calcium diet and increase strengthening exercise to help strengthen his bones and decrease his risk of osteopenia and osteoporosis.  Diabetes II Gaje has been given extensive diabetes education by myself today including ideal fasting and post-prandial blood glucose readings, individual ideal Hgb A1c goals and hypoglycemia prevention. We discussed the importance of good blood sugar control to decrease the likelihood of diabetic complications such as nephropathy, neuropathy, limb loss, blindness, coronary artery disease, and death. We discussed the importance of intensive lifestyle modification including diet, exercise and weight loss as the first line treatment for diabetes. He will continue diet and exercise and Torez agrees to continue metformin as prescribed and  check BGs BID. Tre agrees to follow up with our clinic in 2 weeks.  Obesity Kohler is currently in the action stage of change. As such, his goal is to continue with weight loss efforts He has agreed to follow the Category 2 plan Haitham has been instructed to work up to a goal of 150 minutes of combined cardio and strengthening exercise per week for weight loss and overall health benefits. We discussed the following Behavioral Modification Strategies today: increasing lean protein intake, decreasing simple carbohydrates  and work on meal planning and easy cooking plans Cameryn is encouraged to use his free 200 calories for foods he misses. We discussed with Thelma Comp, RD appropriate portion sizes.  Dereke has agreed to follow up with our clinic in 2 weeks. He was informed of the importance of frequent follow up visits to maximize his success with intensive lifestyle modifications for his multiple health conditions.   OBESITY BEHAVIORAL INTERVENTION VISIT  Today's visit was # 2 out of 22.  Starting weight: 247 lbs Starting date: 02/04/17 Today's weight : 242 lbs  Today's date: 02/18/2017 Total lbs lost to date: 5 (Patients must lose 7 lbs in the first 6 months to continue with counseling)   ASK: We discussed the diagnosis of obesity with Wallace Keller today and Lennin agreed to give [name] permission to discuss obesity behavioral modification therapy today.  ASSESS: Kalif has the diagnosis of obesity and his BMI today is 41.52 Margues is in the action stage of change   ADVISE: Tejuan was educated on the multiple health risks of obesity as well as the benefit of weight loss to improve his health. He was advised of the need for long term treatment and the importance of lifestyle modifications.  AGREE: Multiple dietary modification options and treatment options were discussed and  Nichola agreed to the above obesity treatment plan.  Wilhemena Durie, am acting as transcriptionist for Dennard Nip, MD

## 2017-02-28 ENCOUNTER — Encounter: Payer: Self-pay | Admitting: Gastroenterology

## 2017-03-04 ENCOUNTER — Ambulatory Visit (INDEPENDENT_AMBULATORY_CARE_PROVIDER_SITE_OTHER): Payer: 59 | Admitting: Family Medicine

## 2017-03-04 ENCOUNTER — Encounter (INDEPENDENT_AMBULATORY_CARE_PROVIDER_SITE_OTHER): Payer: Self-pay | Admitting: Family Medicine

## 2017-03-04 VITALS — BP 123/76 | HR 63 | Temp 98.0°F | Ht 64.0 in | Wt 237.0 lb

## 2017-03-04 DIAGNOSIS — Z9189 Other specified personal risk factors, not elsewhere classified: Secondary | ICD-10-CM

## 2017-03-04 DIAGNOSIS — E119 Type 2 diabetes mellitus without complications: Secondary | ICD-10-CM | POA: Insufficient documentation

## 2017-03-04 DIAGNOSIS — E559 Vitamin D deficiency, unspecified: Secondary | ICD-10-CM

## 2017-03-04 DIAGNOSIS — Z6841 Body Mass Index (BMI) 40.0 and over, adult: Secondary | ICD-10-CM | POA: Diagnosis not present

## 2017-03-04 NOTE — Progress Notes (Signed)
Office: [phone]  /  Fax: [phone]   HPI:   Chief Complaint: OBESITY Matthew Garner is here to discuss his progress with his obesity treatment plan. He is on the Category 2 plan and is following his eating plan approximately 97 % of the time. He states he is exercising 0 minutes 0 times per week. Matthew Garner continues to do well with weight loss on his category 2 plan. Hunger is controlled and he likes his food options. His weight is 237 lb (107.5 kg) today and has had a weight loss of 5 pounds over a period of 2 weeks since his last visit. He has lost 10 lbs since starting treatment with [name].  Vitamin D deficiency Matthew Garner has a diagnosis of vitamin D deficiency and is low in vitamin D. He can't take prescription vit D due to it containing gelatin. Matthew Garner denies nausea, vomiting or muscle weakness.   Ref. Range 02/04/2017 10:35  Vitamin D, 25-Hydroxy Latest Ref Range: 30.0 - 100.0 ng/mL 14.2 (L)   Diabetes II Matthew Garner has a diagnosis of diabetes type II. Oden states fasting BGs range between 90 and 138, 2 hour post prandial between 94 and 165 and has improved over the last month with diet prescription. Matthew Garner denies any hypoglycemic episodes. He has been working on intensive lifestyle modifications including diet, exercise, and weight loss to help control his blood glucose levels.  At risk for cardiovascular disease Matthew Garner is at a higher than average risk for cardiovascular disease due to obesity and diabetes. He currently denies any chest pain.  ALLERGIES: No Known Allergies  MEDICATIONS: Current Outpatient Medications on File Prior to Visit  Medication Sig Dispense Refill  . atorvastatin (LIPITOR) 10 MG tablet Take 1 tablet (10 mg total) by mouth daily at 6 PM. 90 tablet 1  . losartan (COZAAR) 50 MG tablet Take 1 tablet (50 mg total) by mouth every morning. 90 tablet 1  . metFORMIN (GLUCOPHAGE) 500 MG tablet Take 1 tablet (500 mg total) by mouth daily with breakfast. 90 tablet 1  . ranitidine  (ZANTAC) 300 MG tablet TAKE 1 TABLET BY MOUTH AT  BEDTIME 90 tablet 0  . sildenafil (VIAGRA) 100 MG tablet Take 0.5 tablets (50 mg total) by mouth daily as needed for erectile dysfunction. 5 tablet 2   No current facility-administered medications on file prior to visit.     PAST MEDICAL HISTORY: Past Medical History:  Diagnosis Date  . Anemia   . Back pain   . Diabetes mellitus   . Environmental allergies 11/27/2011  . GERD (gastroesophageal reflux disease)   . Hyperlipidemia   . Hypertension   . Joint pain   . Knee pain, bilateral   . Sebaceous cyst 11/10/2012  . Snores     PAST SURGICAL HISTORY: Past Surgical History:  Procedure Laterality Date  . APPENDECTOMY    . KNEE SURGERY     right  . MASS EXCISION Bilateral 11/24/2012   Procedure: EXCISION SCALP MASS X2;  Surgeon: [name], MD;  Location: [facility];  Service: General;  Laterality: Bilateral;  . SPINE SURGERY  2004   lumb    SOCIAL HISTORY: Social History   Tobacco Use  . Smoking status: Never Smoker  . Smokeless tobacco: Never Used  Substance Use Topics  . Alcohol use: No  . Drug use: No    FAMILY HISTORY: Family History  Family history unknown: Yes    ROS: Review of Systems  Constitutional: Positive for weight loss.  Cardiovascular:  Negative for chest pain.  Gastrointestinal: Negative for nausea and vomiting.  Musculoskeletal:       Negative for muscle weakness  Endo/Heme/Allergies:       Negative for hypoglycemia    PHYSICAL EXAM: Blood pressure 123/76, pulse 63, temperature 98 F (36.7 C), temperature source Oral, height 5\' 4"  (1.626 m), weight 237 lb (107.5 kg), SpO2 98 %. Body mass index is 40.68 kg/m. Physical Exam  Constitutional: He is oriented to person, place, and time. He appears well-developed and well-nourished.  Cardiovascular: Normal rate.  Pulmonary/Chest: Effort normal.  Musculoskeletal: Normal range of motion.  Neurological: He is oriented to  person, place, and time.  Skin: Skin is warm and dry.  Psychiatric: He has a normal mood and affect. His behavior is normal.  Vitals reviewed.   RECENT LABS AND TESTS: BMET    Component Value Date/Time   NA 139 02/04/2017 1035   K 4.6 02/04/2017 1035   CL 97 02/04/2017 1035   CO2 25 02/04/2017 1035   GLUCOSE 113 (H) 02/04/2017 1035   GLUCOSE 130 (H) 08/20/2016 0926   BUN 12 02/04/2017 1035   CREATININE 0.79 02/04/2017 1035   CREATININE 0.76 09/15/2015 1525   CALCIUM 9.7 02/04/2017 1035   GFRNONAA 102 02/04/2017 1035   GFRNONAA >89 09/15/2015 1525   GFRAA 118 02/04/2017 1035   GFRAA >89 09/15/2015 1525   Lab Results  Component Value Date   HGBA1C 6.8 (H) 02/04/2017   HGBA1C 6.8 (H) 12/13/2016   HGBA1C 7.1 (H) 09/13/2016   HGBA1C 7.0 (H) 06/07/2016   HGBA1C 6.5 (H) 12/22/2015   Lab Results  Component Value Date   INSULIN 15.5 02/04/2017   CBC    Component Value Date/Time   WBC 6.2 02/04/2017 1035   WBC 7.8 08/20/2016 0926   RBC 5.27 02/04/2017 1035   RBC 5.13 08/20/2016 0926   HGB 16.0 02/04/2017 1035   HCT 46.1 02/04/2017 1035   PLT 279 08/20/2016 0926   MCV 88 02/04/2017 1035   MCH 30.4 02/04/2017 1035   MCH 30.0 08/20/2016 0926   MCHC 34.7 02/04/2017 1035   MCHC 35.5 08/20/2016 0926   RDW 13.4 02/04/2017 1035   LYMPHSABS 1.5 02/04/2017 1035   MONOABS 0.7 08/20/2016 0926   EOSABS 0.1 02/04/2017 1035   BASOSABS 0.1 02/04/2017 1035   Iron/TIBC/Ferritin/ %Sat No results found for: IRON, TIBC, FERRITIN, IRONPCTSAT Lipid Panel     Component Value Date/Time   CHOL 145 02/04/2017 1035   TRIG 77 02/04/2017 1035   HDL 61 02/04/2017 1035   CHOLHDL 2.6 12/13/2016 1632   CHOLHDL 3.0 09/15/2015 1525   VLDL 13 09/15/2015 1525   LDLCALC 69 02/04/2017 1035   Hepatic Function Panel     Component Value Date/Time   PROT 7.3 02/04/2017 1035   ALBUMIN 4.6 02/04/2017 1035   AST 17 02/04/2017 1035   ALT 21 02/04/2017 1035   ALKPHOS 84 02/04/2017 1035   BILITOT  0.4 02/04/2017 1035      Component Value Date/Time   TSH 1.620 02/04/2017 1035    Ref. Range 02/04/2017 10:35  Vitamin D, 25-Hydroxy Latest Ref Range: 30.0 - 100.0 ng/mL 14.2 (L)   ASSESSMENT AND PLAN: Type 2 diabetes mellitus without complication, without long-term current use of insulin (HCC)  Vitamin D deficiency  At risk for heart disease  Class 3 severe obesity with serious comorbidity and body mass index (BMI) of 40.0 to 44.9 in adult, unspecified obesity type (Matthew Garner)  PLAN:  Vitamin D Deficiency  Matthew Garner was informed that low vitamin D levels contributes to fatigue and are associated with obesity, breast, and colon cancer. He agrees to take OTC Vit D @5 ,000 IU every day (gelatin free) and we will recheck labs in 2 months. He will follow up for routine testing of vitamin D, at least 2-3 times per year. He was informed of the risk of over-replacement of vitamin D and agrees to not increase his dose unless he discusses this with [name] first. Matthew Garner agrees to follow up with our clinic in 2 weeks.  Diabetes II Matthew Garner has been given extensive diabetes education by myself today including ideal fasting and post-prandial blood glucose readings, individual ideal Hgb A1c goals and hypoglycemia prevention. We discussed the importance of good blood sugar control to decrease the likelihood of diabetic complications such as nephropathy, neuropathy, limb loss, blindness, coronary artery disease, and death. We discussed the importance of intensive lifestyle modification including diet, exercise and weight loss as the first line treatment for diabetes. Matthew Garner agrees to continue to work on diet and exercise and we will recheck labs in 2 months. Earnestine agrees to continue metformin and follow up at the agreed upon time.  Cardiovascular risk counseling Matthew Garner was given extended (15 minutes) coronary artery disease prevention counseling today. He is 57 y.o. male and has risk factors for heart disease including obesity  and diabetes. We discussed intensive lifestyle modifications today with an emphasis on specific weight loss instructions and strategies. Pt was also informed of the importance of increasing exercise and decreasing saturated fats to help prevent heart disease.  Obesity Matthew Garner is currently in the action stage of change. As such, his goal is to continue with weight loss efforts He has agreed to follow the Category 2 plan Matthew Garner has been instructed to work up to a goal of 150 minutes of combined cardio and strengthening exercise per week or start cardio 15 to 30 minutes 2 times per week for weight loss and overall health benefits. We discussed the following Behavioral Modification Strategies today: increasing lean protein intake and decrease eating out  Bryton has agreed to follow up with our clinic in 2 weeks. He was informed of the importance of frequent follow up visits to maximize his success with intensive lifestyle modifications for his multiple health conditions.   OBESITY BEHAVIORAL INTERVENTION VISIT  Today's visit was # 3 out of 22.  Starting weight: 247 lbs Starting date: 02/04/17 Today's weight : 237 lbs  Today's date: 03/04/2017 Total lbs lost to date: 10 (Patients must lose 7 lbs in the first 6 months to continue with counseling)   ASK: We discussed the diagnosis of obesity with Matthew Garner today and Matthew Garner agreed to give [name] permission to discuss obesity behavioral modification therapy today.  ASSESS: Matthew Garner has the diagnosis of obesity and his BMI today is 40.66 Teddie is in the action stage of change   ADVISE: Demtrius was educated on the multiple health risks of obesity as well as the benefit of weight loss to improve his health. He was advised of the need for long term treatment and the importance of lifestyle modifications.  AGREE: Multiple dietary modification options and treatment options were discussed and  Elisah agreed to the above obesity treatment plan.  I, Doreene Nest,  am acting as transcriptionist for Dennard Nip, MD  I have reviewed the above documentation for accuracy and completeness, and I agree with the above. -Dennard Nip, MD

## 2017-03-05 ENCOUNTER — Encounter: Payer: Self-pay | Admitting: Family Medicine

## 2017-03-05 ENCOUNTER — Ambulatory Visit (INDEPENDENT_AMBULATORY_CARE_PROVIDER_SITE_OTHER): Payer: 59 | Admitting: Family Medicine

## 2017-03-05 VITALS — BP 139/80 | HR 60 | Temp 97.9°F | Resp 16 | Ht 64.0 in | Wt 240.0 lb

## 2017-03-05 DIAGNOSIS — I10 Essential (primary) hypertension: Secondary | ICD-10-CM

## 2017-03-05 DIAGNOSIS — E785 Hyperlipidemia, unspecified: Secondary | ICD-10-CM | POA: Diagnosis not present

## 2017-03-05 DIAGNOSIS — E119 Type 2 diabetes mellitus without complications: Secondary | ICD-10-CM

## 2017-03-05 NOTE — Patient Instructions (Addendum)
I do not see an issue with current medication during the fast if you are eating a meal early in the morning and late at night. However, this can be discussed with Dr. Leafy Ro if you would like.   No change in blood pressure meds with current readings.   Cholesterol recheck in 3 months at time of a physical.   Keep up the good work with weight loss!    IF you received an x-ray today, you will receive an invoice from [facility]. Please contact [facility] at [phone] with questions or concerns regarding your invoice.   IF you received labwork today, you will receive an invoice from [facility]. Please contact [facility] at [phone] with questions or concerns regarding your invoice.   Our billing staff will not be able to assist you with questions regarding bills from these companies.  You will be contacted with the lab results as soon as they are available. The fastest way to get your results is to activate your My Chart account. Instructions are located on the last page of this paperwork. If you have not heard from [name] regarding the results in 2 weeks, please contact this office.

## 2017-03-05 NOTE — Progress Notes (Signed)
Subjective:  By signing my name below, I, [name], attest that this documentation has been prepared under the direction and in the presence of Wendie Agreste, MD Electronically Signed: Ladene Artist, ED Scribe 03/05/2017 at 5:12 PM.   Patient ID: Matthew Garner, male    DOB: 1960-03-11, 57 y.o.   MRN: [id number]  Chief Complaint  Patient presents with  . Diabetes    Follow up   HPI Matthew Garner is a 58 y.o. male who presents to Primary Care at [facility] for a f/u on DM. Pt is fasting at this visit.  TIIDM Metformin 500 mg qd. Followed by Dr. Leafy Ro at [facility]. Pt states that he will be fasting for 30 days in May for religious reasons, however, he states Dr. Leafy Ro told him he doesn't have to fast. Pt states that he would take Metformin and eat breakfast in the morning which is before sunset, skip lunch and eat dinner later during the fast. Denies any side-effects or symptomatic lows, even with fasting in the past. Did review notes from Dr. Leafy Ro 2/18 and yesterday.  Wt Readings from Last 3 Encounters:  03/05/17 240 lb (108.9 kg)  03/04/17 237 lb (107.5 kg)  02/18/17 242 lb (109.8 kg)   HTN Losartan 50 mg qd. Denies any symptoms or side-effects. BP Readings from Last 3 Encounters:  03/05/17 139/80  03/04/17 123/76  02/18/17 128/74   Vitamin D Deficiency Level was 14 when checked 2/4. Currently on 5, 000 units.  Hyperlipidemia Lab Results  Component Value Date   CHOL 145 02/04/2017   HDL 61 02/04/2017   LDLCALC 69 02/04/2017   TRIG 77 02/04/2017   CHOLHDL 2.6 12/13/2016   Lab Results  Component Value Date   ALT 21 02/04/2017   AST 17 02/04/2017   ALKPHOS 84 02/04/2017   BILITOT 0.4 02/04/2017  Lipitor 10 mg qd. Normal liver test 2/4.  Patient Active Problem List   Diagnosis Date Noted  . Type 2 diabetes mellitus without complication, without long-term current use of insulin ([place]) 03/04/2017  . Vitamin D deficiency 03/04/2017  . Other fatigue  02/04/2017  . Shortness of breath on exertion 02/04/2017  . Knee pain, bilateral 03/29/2015  . Nonspecific abnormal electrocardiogram (ECG) (EKG) 04/21/2014  . Spinal stenosis of lumbar region 10/27/2013  . Pain of left calf 06/16/2013  . Diabetes ([place]) 11/27/2011  . Hyperlipidemia 11/27/2011  . Hypertension 11/27/2011   Past Medical History:  Diagnosis Date  . Anemia   . Back pain   . Diabetes mellitus   . Environmental allergies 11/27/2011  . GERD (gastroesophageal reflux disease)   . Hyperlipidemia   . Hypertension   . Joint pain   . Knee pain, bilateral   . Sebaceous cyst 11/10/2012  . Snores    Past Surgical History:  Procedure Laterality Date  . APPENDECTOMY    . KNEE SURGERY     right  . MASS EXCISION Bilateral 11/24/2012   Procedure: EXCISION SCALP MASS X2;  Surgeon: [name], MD;  Location: [place];  Service: General;  Laterality: Bilateral;  . SPINE SURGERY  2004   lumb   No Known Allergies Prior to Admission medications   Medication Sig Start Date End Date Taking? Authorizing Provider  atorvastatin (LIPITOR) 10 MG tablet Take 1 tablet (10 mg total) by mouth daily at 6 PM. 12/13/16  Yes Wendie Agreste, MD  losartan (COZAAR) 50 MG tablet Take 1 tablet (50 mg total) by mouth every  morning. 12/13/16  Yes Wendie Agreste, MD  metFORMIN (GLUCOPHAGE) 500 MG tablet Take 1 tablet (500 mg total) by mouth daily with breakfast. 12/13/16  Yes Wendie Agreste, MD  ranitidine (ZANTAC) 300 MG tablet TAKE 1 TABLET BY MOUTH AT  BEDTIME 01/25/17  Yes Wendie Agreste, MD  sildenafil (VIAGRA) 100 MG tablet Take 0.5 tablets (50 mg total) by mouth daily as needed for erectile dysfunction. 12/13/16  Yes Wendie Agreste, MD   Social History   Socioeconomic History  . Marital status: Married    Spouse name: [name]  . Number of children: 3  . Years of education: Not on file  . Highest education level: Not on file  Social Needs  .  Financial resource strain: Not on file  . Food insecurity - worry: Not on file  . Food insecurity - inability: Not on file  . Transportation needs - medical: Not on file  . Transportation needs - non-medical: Not on file  Occupational History  . Occupation: Works in Human resources officer  . Smoking status: Never Smoker  . Smokeless tobacco: Never Used  Substance and Sexual Activity  . Alcohol use: No  . Drug use: No  . Sexual activity: Not on file  Other Topics Concern  . Not on file  Social History Narrative  . Not on file   Review of Systems  Constitutional: Negative for fatigue and unexpected weight change.  Eyes: Negative for visual disturbance.  Respiratory: Negative for cough, chest tightness and shortness of breath.   Cardiovascular: Negative for chest pain, palpitations and leg swelling.  Gastrointestinal: Negative for abdominal pain and blood in stool.  Neurological: Negative for dizziness, light-headedness and headaches.      Objective:   Physical Exam  Constitutional: He is oriented to person, place, and time. He appears well-developed and well-nourished.  HENT:  Head: Normocephalic and atraumatic.  Eyes: EOM are normal. Pupils are equal, round, and reactive to light.  Neck: No JVD present. Carotid bruit is not present.  Cardiovascular: Normal rate, regular rhythm and normal heart sounds.  No murmur heard. Pulmonary/Chest: Effort normal and breath sounds normal. He has no rales.  Musculoskeletal: He exhibits no edema.  Neurological: He is alert and oriented to person, place, and time.  Skin: Skin is warm and dry.  Psychiatric: He has a normal mood and affect.  Vitals reviewed.  Vitals:   03/05/17 1643  BP: 139/80  Pulse: 60  Resp: 16  Temp: 97.9 F (36.6 C)  TempSrc: Oral  SpO2: 96%  Weight: 240 lb (108.9 kg)  Height: 5\' 4"  (1.626 m)      Assessment & Plan:  Matthew Garner is a 57 y.o. male Essential hypertension  -Borderline high, but controlled at  outside visits. Suspect that will continue improve as we improves. Commended on his efforts and continued follow-up with medical bariatrics  -Continue same regimen  Type 2 diabetes mellitus without complication, without long-term current use of insulin (HCC)  -Recent A1c stable. Suspect he will continue to have improvement with weight loss. No change in regimen for now. Doubt he would have a problem continuing metformin during fast in few months as he will be eating breakfast and dinner - just earlier and later than usual. Can discuss with Dr. Leafy Ro as well  Hyperlipidemia, unspecified hyperlipidemia type  -Recent lipids reviewed with LDL less than 70. With diabetes would continue Lipitor same dose as currently tolerating.  Recheck in 3 months for physical  No orders of the defined types were placed in this encounter.  Patient Instructions   I do not see an issue with current medication during the fast if you are eating a meal early in the morning and late at night. However, this can be discussed with Dr. Leafy Ro if you would like.   No change in blood pressure meds with current readings.   Cholesterol recheck in 3 months at time of a physical.   Keep up the good work with weight loss!    IF you received an x-ray today, you will receive an invoice from [facility]. Please contact [facility] at [phone] with questions or concerns regarding your invoice.   IF you received labwork today, you will receive an invoice from [facility]. Please contact [facility] at [phone] with questions or concerns regarding your invoice.   Our billing staff will not be able to assist you with questions regarding bills from these companies.  You will be contacted with the lab results as soon as they are available. The fastest way to get your results is to activate your My Chart account. Instructions are located on the last page of this paperwork. If you have not heard from [name] regarding  the results in 2 weeks, please contact this office.      I personally performed the services described in this documentation, which was scribed in my presence. The recorded information has been reviewed and considered for accuracy and completeness, addended by me as needed, and agree with information above.  Signed,   [name], MD Primary [facility] at [facility].  03/06/17 10:23 PM

## 2017-03-06 ENCOUNTER — Encounter: Payer: Self-pay | Admitting: Family Medicine

## 2017-03-07 MED ORDER — VITAMIN D3 125 MCG (5000 UT) PO TABS: 1.0000 | ORAL_TABLET | Freq: Every day | ORAL | Status: DC

## 2017-03-19 ENCOUNTER — Encounter (INDEPENDENT_AMBULATORY_CARE_PROVIDER_SITE_OTHER): Payer: Self-pay | Admitting: Family Medicine

## 2017-03-19 ENCOUNTER — Ambulatory Visit (INDEPENDENT_AMBULATORY_CARE_PROVIDER_SITE_OTHER): Payer: 59 | Admitting: Family Medicine

## 2017-03-19 VITALS — BP 118/74 | HR 84 | Temp 98.2°F | Ht 64.0 in | Wt 234.0 lb

## 2017-03-19 DIAGNOSIS — Z6841 Body Mass Index (BMI) 40.0 and over, adult: Secondary | ICD-10-CM | POA: Diagnosis not present

## 2017-03-19 DIAGNOSIS — I10 Essential (primary) hypertension: Secondary | ICD-10-CM | POA: Diagnosis not present

## 2017-03-19 DIAGNOSIS — E559 Vitamin D deficiency, unspecified: Secondary | ICD-10-CM

## 2017-03-19 DIAGNOSIS — E119 Type 2 diabetes mellitus without complications: Secondary | ICD-10-CM | POA: Diagnosis not present

## 2017-03-20 NOTE — Progress Notes (Signed)
Office: [phone]  /  Fax: [phone]   HPI:   Chief Complaint: OBESITY Matthew Garner is here to discuss his progress with his obesity treatment plan. He is on the Category 2 plan and is following his eating plan approximately 97 % of the time. He states he is running and cycling for 20 minutes 2 times per week. Matthew Garner is liking Category 2 plan. He finds saying no to cravings easier. Craving dates, wants to eat these for snack calories in the morning with coffee.  His weight is 234 lb (106.1 kg) today and has had a weight loss of 3 pounds over a period of 2 weeks since his last visit. He has lost 13 lbs since starting treatment with [name].  Hypertension Matthew Garner is a 57 y.o. male with hypertension. Matthew Garner's blood pressure is controlled today. He denies chest pain, chest pressure, or headache. He is working weight loss to help control his blood pressure with the goal of decreasing his risk of heart attack and stroke. Matthew Garner's blood pressure is currently controlled.  Diabetes II Matthew Garner has a diagnosis of diabetes type II. Matthew Garner is on metformin (PCP prescribes) and he denies any hypoglycemic episodes. Last A1c was 6.8 on 02/04/17. He has been working on intensive lifestyle modifications including diet, exercise, and weight loss to help control his blood glucose levels.  Vitamin D Deficiency Matthew Garner has a diagnosis of vitamin D deficiency. He is on 5,000 IU daily Vit D and denies nausea, vomiting or muscle weakness.  ALLERGIES: No Known Allergies  MEDICATIONS: Current Outpatient Medications on File Prior to Visit  Medication Sig Dispense Refill  . atorvastatin (LIPITOR) 10 MG tablet Take 1 tablet (10 mg total) by mouth daily at 6 PM. 90 tablet 1  . Cholecalciferol (VITAMIN D3) 5000 units TABS Take 1 tablet (5,000 Units total) by mouth daily at 12 noon. 30 tablet   . losartan (COZAAR) 50 MG tablet Take 1 tablet (50 mg total) by mouth every morning. 90 tablet 1  . metFORMIN (GLUCOPHAGE) 500 MG tablet  Take 1 tablet (500 mg total) by mouth daily with breakfast. 90 tablet 1  . ranitidine (ZANTAC) 300 MG tablet TAKE 1 TABLET BY MOUTH AT  BEDTIME 90 tablet 0  . sildenafil (VIAGRA) 100 MG tablet Take 0.5 tablets (50 mg total) by mouth daily as needed for erectile dysfunction. 5 tablet 2   No current facility-administered medications on file prior to visit.     PAST MEDICAL HISTORY: Past Medical History:  Diagnosis Date  . Anemia   . Back pain   . Diabetes mellitus   . Environmental allergies 11/27/2011  . GERD (gastroesophageal reflux disease)   . Hyperlipidemia   . Hypertension   . Joint pain   . Knee pain, bilateral   . Sebaceous cyst 11/10/2012  . Snores     PAST SURGICAL HISTORY: Past Surgical History:  Procedure Laterality Date  . APPENDECTOMY    . KNEE SURGERY     right  . MASS EXCISION Bilateral 11/24/2012   Procedure: EXCISION SCALP MASS X2;  Surgeon: [name], MD;  Location: [place];  Service: General;  Laterality: Bilateral;  . SPINE SURGERY  2004   lumb    SOCIAL HISTORY: Social History   Tobacco Use  . Smoking status: Never Smoker  . Smokeless tobacco: Never Used  Substance Use Topics  . Alcohol use: No  . Drug use: No    FAMILY HISTORY: Family History  Family history unknown: Yes  ROS: Review of Systems  Constitutional: Positive for weight loss.  Cardiovascular: Negative for chest pain.       Negative chest pressure  Gastrointestinal: Negative for nausea and vomiting.  Musculoskeletal:       Negative muscle weakness  Neurological: Negative for headaches.    PHYSICAL EXAM: Blood pressure 118/74, pulse 84, temperature 98.2 F (36.8 C), temperature source Oral, height 5\' 4"  (1.626 m), weight 234 lb (106.1 kg), SpO2 97 %. Body mass index is 40.17 kg/m. Physical Exam  RECENT LABS AND TESTS: BMET    Component Value Date/Time   NA 139 02/04/2017 1035   K 4.6 02/04/2017 1035   CL 97 02/04/2017 1035   CO2 25  02/04/2017 1035   GLUCOSE 113 (H) 02/04/2017 1035   GLUCOSE 130 (H) 08/20/2016 0926   BUN 12 02/04/2017 1035   CREATININE 0.79 02/04/2017 1035   CREATININE 0.76 09/15/2015 1525   CALCIUM 9.7 02/04/2017 1035   GFRNONAA 102 02/04/2017 1035   GFRNONAA >89 09/15/2015 1525   GFRAA 118 02/04/2017 1035   GFRAA >89 09/15/2015 1525   Lab Results  Component Value Date   HGBA1C 6.8 (H) 02/04/2017   HGBA1C 6.8 (H) 12/13/2016   HGBA1C 7.1 (H) 09/13/2016   HGBA1C 7.0 (H) 06/07/2016   HGBA1C 6.5 (H) 12/22/2015   Lab Results  Component Value Date   INSULIN 15.5 02/04/2017   CBC    Component Value Date/Time   WBC 6.2 02/04/2017 1035   WBC 7.8 08/20/2016 0926   RBC 5.27 02/04/2017 1035   RBC 5.13 08/20/2016 0926   HGB 16.0 02/04/2017 1035   HCT 46.1 02/04/2017 1035   PLT 279 08/20/2016 0926   MCV 88 02/04/2017 1035   MCH 30.4 02/04/2017 1035   MCH 30.0 08/20/2016 0926   MCHC 34.7 02/04/2017 1035   MCHC 35.5 08/20/2016 0926   RDW 13.4 02/04/2017 1035   LYMPHSABS 1.5 02/04/2017 1035   MONOABS 0.7 08/20/2016 0926   EOSABS 0.1 02/04/2017 1035   BASOSABS 0.1 02/04/2017 1035   Iron/TIBC/Ferritin/ %Sat No results found for: IRON, TIBC, FERRITIN, IRONPCTSAT Lipid Panel     Component Value Date/Time   CHOL 145 02/04/2017 1035   TRIG 77 02/04/2017 1035   HDL 61 02/04/2017 1035   CHOLHDL 2.6 12/13/2016 1632   CHOLHDL 3.0 09/15/2015 1525   VLDL 13 09/15/2015 1525   LDLCALC 69 02/04/2017 1035   Hepatic Function Panel     Component Value Date/Time   PROT 7.3 02/04/2017 1035   ALBUMIN 4.6 02/04/2017 1035   AST 17 02/04/2017 1035   ALT 21 02/04/2017 1035   ALKPHOS 84 02/04/2017 1035   BILITOT 0.4 02/04/2017 1035      Component Value Date/Time   TSH 1.620 02/04/2017 1035  Results for OSHA, ERRICO (MRN [id number]) as of 03/20/2017 09:07  Ref. Range 02/04/2017 10:35  Vitamin D, 25-Hydroxy Latest Ref Range: 30.0 - 100.0 ng/mL 14.2 (L)    ASSESSMENT AND PLAN: Essential  hypertension  Type 2 diabetes mellitus without complication, without long-term current use of insulin (HCC)  Vitamin D deficiency  Class 3 severe obesity with serious comorbidity and body mass index (BMI) of 40.0 to 44.9 in adult, unspecified obesity type ([place])  PLAN:  Hypertension We discussed sodium restriction, working on healthy weight loss, and a regular exercise program as the means to achieve improved blood pressure control. Matthew Garner agreed with this plan and agreed to follow up as directed. We will continue to monitor his blood pressure as  well as his progress with the above lifestyle modifications. Matthew Garner agrees to continue taking losartan as prescribed and will watch for signs of hypotension as he continues his lifestyle modifications. Matthew Garner agrees to follow up with our clinic in 2 weeks.  Diabetes II Matthew Garner has been given extensive diabetes education by myself today including ideal fasting and post-prandial blood glucose readings, individual ideal Hgb A1c goals and hypoglycemia prevention. We discussed the importance of good blood sugar control to decrease the likelihood of diabetic complications such as nephropathy, neuropathy, limb loss, blindness, coronary artery disease, and death. We discussed the importance of intensive lifestyle modification including diet, exercise and weight loss as the first line treatment for diabetes. Matthew Garner agrees to continue taking metformin 500 mg daily q AM and he agrees to follow up with our clinic in 2 weeks.  Vitamin D Deficiency Matthew Garner was informed that low vitamin D levels contributes to fatigue and are associated with obesity, breast, and colon cancer. Matthew Garner agrees to continue taking 5,000 gelatin free Vit D supplement and will follow up for routine testing of vitamin D, at least 2-3 times per year. He was informed of the risk of over-replacement of vitamin D and agrees to not increase his dose unless he discusses this with [name] first. Matthew Garner agrees to follow  up with our clinic in 2 weeks.  We spent > than 50% of the 15 minute visit on the counseling as documented in the note.  Obesity Matthew Garner is currently in the action stage of change. As such, his goal is to continue with weight loss efforts He has agreed to follow the Category 2 plan Matthew Garner has been instructed to work up to a goal of 150 minutes of combined cardio and strengthening exercise per week for weight loss and overall health benefits. We discussed the following Behavioral Modification Strategies today: increasing lean protein intake, increasing vegetables, work on meal planning and easy cooking plans, and planning for success   Matthew Garner has agreed to follow up with our clinic in 2 weeks. He was informed of the importance of frequent follow up visits to maximize his success with intensive lifestyle modifications for his multiple health conditions.   OBESITY BEHAVIORAL INTERVENTION VISIT  Today's visit was # 4 out of 22.  Starting weight: 247 lbs Starting date: 02/04/17 Today's weight : 234 lbs Today's date: 03/19/2017 Total lbs lost to date: 13 (Patients must lose 7 lbs in the first 6 months to continue with counseling)   ASK: We discussed the diagnosis of obesity with Matthew Garner today and Matthew Garner agreed to give [name] permission to discuss obesity behavioral modification therapy today.  ASSESS: Matthew Garner has the diagnosis of obesity and his BMI today is 40.15 Matthew Garner is in the action stage of change   ADVISE: Matthew Garner was educated on the multiple health risks of obesity as well as the benefit of weight loss to improve his health. He was advised of the need for long term treatment and the importance of lifestyle modifications.  AGREE: Multiple dietary modification options and treatment options were discussed and  Matthew Garner agreed to the above obesity treatment plan.  I, Trixie Dredge, am acting as transcriptionist for Ilene Qua, MD  I have reviewed the above documentation for accuracy  and completeness, and I agree with the above. -Dennard Nip, MD

## 2017-04-02 ENCOUNTER — Encounter (INDEPENDENT_AMBULATORY_CARE_PROVIDER_SITE_OTHER): Payer: Self-pay | Admitting: Physician Assistant

## 2017-04-02 ENCOUNTER — Ambulatory Visit (INDEPENDENT_AMBULATORY_CARE_PROVIDER_SITE_OTHER): Payer: 59 | Admitting: Physician Assistant

## 2017-04-02 VITALS — BP 121/74 | HR 57 | Temp 97.9°F | Ht 64.0 in | Wt 232.0 lb

## 2017-04-02 DIAGNOSIS — E559 Vitamin D deficiency, unspecified: Secondary | ICD-10-CM | POA: Diagnosis not present

## 2017-04-02 DIAGNOSIS — Z6839 Body mass index (BMI) 39.0-39.9, adult: Secondary | ICD-10-CM

## 2017-04-02 NOTE — Progress Notes (Signed)
Office: [phone]  /  Fax: [phone]   HPI:   Chief Complaint: OBESITY Matthew Garner is here to discuss Matthew Garner progress with Matthew Garner obesity treatment plan. Matthew Garner is on the Category 2 plan and is following Matthew Garner eating plan approximately 97 % of the time. Matthew Garner states Matthew Garner is exercising on the treadmill and bike for 30 minutes 4 times per week. Matthew Garner continues to do well with weight loss. Matthew Garner has had increased family sabotage and has challenges eating the recommended  protein. Matthew Garner weight is 232 lb (105.2 kg) today and has had a weight loss of 2 pounds over a period of 2 weeks since Matthew Garner last visit. Matthew Garner has lost 15 lbs since starting treatment with Matthew Garner.  Vitamin D deficiency Matthew Garner has a diagnosis of vitamin D deficiency. Matthew Garner is currently taking vit D and denies nausea, vomiting or muscle weakness.   ALLERGIES: No Known Allergies  MEDICATIONS: Current Outpatient Medications on File Prior to Visit  Medication Sig Dispense Refill  . atorvastatin (LIPITOR) 10 MG tablet Take 1 tablet (10 mg total) by mouth daily at 6 PM. 90 tablet 1  . Cholecalciferol (VITAMIN D3) 5000 units TABS Take 1 tablet (5,000 Units total) by mouth daily at 12 noon. 30 tablet   . losartan (COZAAR) 50 MG tablet Take 1 tablet (50 mg total) by mouth every morning. 90 tablet 1  . metFORMIN (GLUCOPHAGE) 500 MG tablet Take 1 tablet (500 mg total) by mouth daily with breakfast. 90 tablet 1  . ranitidine (ZANTAC) 300 MG tablet TAKE 1 TABLET BY MOUTH AT  BEDTIME 90 tablet 0  . sildenafil (VIAGRA) 100 MG tablet Take 0.5 tablets (50 mg total) by mouth daily as needed for erectile dysfunction. 5 tablet 2   No current facility-administered medications on file prior to visit.     PAST MEDICAL HISTORY: Past Medical History:  Diagnosis Date  . Anemia   . Back pain   . Diabetes mellitus   . Environmental allergies 11/27/2011  . GERD (gastroesophageal reflux disease)   . Hyperlipidemia   . Hypertension   . Joint pain   . Knee pain, bilateral    . Sebaceous cyst 11/10/2012  . Snores     PAST SURGICAL HISTORY: Past Surgical History:  Procedure Laterality Date  . APPENDECTOMY    . KNEE SURGERY     right  . MASS EXCISION Bilateral 11/24/2012   Procedure: EXCISION SCALP MASS X2;  Surgeon: [name], MD;  Location: [facility];  Service: General;  Laterality: Bilateral;  . SPINE SURGERY  2004   lumb    SOCIAL HISTORY: Social History   Tobacco Use  . Smoking status: Never Smoker  . Smokeless tobacco: Never Used  Substance Use Topics  . Alcohol use: No  . Drug use: No    FAMILY HISTORY: Family History  Family history unknown: Yes    ROS: Review of Systems  Constitutional: Positive for weight loss.  Gastrointestinal: Negative for nausea and vomiting.  Musculoskeletal:       Negative for muscle weakness    PHYSICAL EXAM: Blood pressure 121/74, pulse (!) 57, temperature 97.9 F (36.6 C), temperature source Oral, height 5\' 4"  (1.626 m), weight 232 lb (105.2 kg), SpO2 99 %. Body mass index is 39.82 kg/m. Physical Exam  Constitutional: Matthew Garner is oriented to person, place, and time. Matthew Garner appears well-developed and well-nourished.  Cardiovascular: Bradycardia present.  Pulmonary/Chest: Effort normal.  Musculoskeletal: Normal range of motion.  Neurological: Matthew Garner is oriented to person,  place, and time.  Skin: Skin is warm and dry.  Psychiatric: Matthew Garner has a normal mood and affect. Matthew Garner behavior is normal.  Vitals reviewed.   RECENT LABS AND TESTS: BMET    Component Value Date/Time   NA 139 02/04/2017 1035   K 4.6 02/04/2017 1035   CL 97 02/04/2017 1035   CO2 25 02/04/2017 1035   GLUCOSE 113 (H) 02/04/2017 1035   GLUCOSE 130 (H) 08/20/2016 0926   BUN 12 02/04/2017 1035   CREATININE 0.79 02/04/2017 1035   CREATININE 0.76 09/15/2015 1525   CALCIUM 9.7 02/04/2017 1035   GFRNONAA 102 02/04/2017 1035   GFRNONAA >89 09/15/2015 1525   GFRAA 118 02/04/2017 1035   GFRAA >89 09/15/2015 1525   Lab  Results  Component Value Date   HGBA1C 6.8 (H) 02/04/2017   HGBA1C 6.8 (H) 12/13/2016   HGBA1C 7.1 (H) 09/13/2016   HGBA1C 7.0 (H) 06/07/2016   HGBA1C 6.5 (H) 12/22/2015   Lab Results  Component Value Date   INSULIN 15.5 02/04/2017   CBC    Component Value Date/Time   WBC 6.2 02/04/2017 1035   WBC 7.8 08/20/2016 0926   RBC 5.27 02/04/2017 1035   RBC 5.13 08/20/2016 0926   HGB 16.0 02/04/2017 1035   HCT 46.1 02/04/2017 1035   PLT 279 08/20/2016 0926   MCV 88 02/04/2017 1035   MCH 30.4 02/04/2017 1035   MCH 30.0 08/20/2016 0926   MCHC 34.7 02/04/2017 1035   MCHC 35.5 08/20/2016 0926   RDW 13.4 02/04/2017 1035   LYMPHSABS 1.5 02/04/2017 1035   MONOABS 0.7 08/20/2016 0926   EOSABS 0.1 02/04/2017 1035   BASOSABS 0.1 02/04/2017 1035   Iron/TIBC/Ferritin/ %Sat No results found for: IRON, TIBC, FERRITIN, IRONPCTSAT Lipid Panel     Component Value Date/Time   CHOL 145 02/04/2017 1035   TRIG 77 02/04/2017 1035   HDL 61 02/04/2017 1035   CHOLHDL 2.6 12/13/2016 1632   CHOLHDL 3.0 09/15/2015 1525   VLDL 13 09/15/2015 1525   LDLCALC 69 02/04/2017 1035   Hepatic Function Panel     Component Value Date/Time   PROT 7.3 02/04/2017 1035   ALBUMIN 4.6 02/04/2017 1035   AST 17 02/04/2017 1035   ALT 21 02/04/2017 1035   ALKPHOS 84 02/04/2017 1035   BILITOT 0.4 02/04/2017 1035      Component Value Date/Time   TSH 1.620 02/04/2017 1035   Results for Matthew Garner, Matthew Garner (MRN [id number]) as of 04/02/2017 17:11  Ref. Range 02/04/2017 10:35  Vitamin D, 25-Hydroxy Latest Ref Range: 30.0 - 100.0 ng/mL 14.2 (L)   ASSESSMENT AND PLAN: Vitamin D deficiency  Class 2 severe obesity with serious comorbidity and body mass index (BMI) of 39.0 to 39.9 in adult, unspecified obesity type ([place])  PLAN:  Vitamin D Deficiency Matthew Garner was informed that low vitamin D levels contributes to fatigue and are associated with obesity, breast, and colon cancer. Matthew Garner agrees to continue to take prescription Vit D  @5 ,000 IU daily and will follow up for routine testing of vitamin D, at least 2-3 times per year. Matthew Garner was informed of the risk of over-replacement of vitamin D and agrees to not increase Matthew Garner dose unless Matthew Garner discusses this with Matthew Garner first.  We spent > than 50% of the 15 minute visit on the counseling as documented in the note.  Obesity Derward is currently in the action stage of change. As such, Matthew Garner goal is to continue with weight loss efforts Matthew Garner has agreed to follow  the Category 2 plan Doyt has been instructed to work up to a goal of 150 minutes of combined cardio and strengthening exercise per week for weight loss and overall health benefits. We discussed the following Behavioral Modification Strategies today: increasing lean protein intake and dealing with family or coworker sabotage  Wilborn has agreed to follow up with our clinic in 2 weeks. Matthew Garner was informed of the importance of frequent follow up visits to maximize Matthew Garner success with intensive lifestyle modifications for Matthew Garner multiple health conditions.   OBESITY BEHAVIORAL INTERVENTION VISIT  Today's visit was # 5 out of 22.  Starting weight: 247 lbs Starting date: 02/04/17 Today's weight : 232 lbs Today's date: 04/02/2017 Total lbs lost to date: 15 (Patients must lose 7 lbs in the first 6 months to continue with counseling)   ASK: We discussed the diagnosis of obesity with Wallace Keller today and Dago agreed to give Matthew Garner permission to discuss obesity behavioral modification therapy today.  ASSESS: Frandy has the diagnosis of obesity and Matthew Garner BMI today is 39.8 Lamont is in the action stage of change   ADVISE: Zakhari was educated on the multiple health risks of obesity as well as the benefit of weight loss to improve Matthew Garner health. Matthew Garner was advised of the need for long term treatment and the importance of lifestyle modifications.  AGREE: Multiple dietary modification options and treatment options were discussed and  Derrius agreed to the above obesity  treatment plan.   Corey Skains, am acting as transcriptionist for [employer], PA-C I, [facility], have reviewed this note and agree with its content

## 2017-04-03 ENCOUNTER — Ambulatory Visit (AMBULATORY_SURGERY_CENTER): Payer: Self-pay | Admitting: *Deleted

## 2017-04-03 ENCOUNTER — Other Ambulatory Visit: Payer: Self-pay

## 2017-04-03 VITALS — Ht 64.0 in | Wt 239.0 lb

## 2017-04-03 DIAGNOSIS — Z1211 Encounter for screening for malignant neoplasm of colon: Secondary | ICD-10-CM

## 2017-04-03 MED ORDER — NA SULFATE-K SULFATE-MG SULF 17.5-3.13-1.6 GM/177ML PO SOLN: 1.0000 | Freq: Once | ORAL | 0 refills | Status: AC

## 2017-04-03 NOTE — Progress Notes (Signed)
No egg or soy allergy known to patient  No issues with past sedation with any surgeries  or procedures, no intubation problems  No diet pills per patient No home 02 use per patient  No blood thinners per patient  Pt denies issues with constipation  No A fib or A flutter  EMMI video sent to pt's e mail -  $15 coupon to pt in [place] today for suprep

## 2017-04-04 ENCOUNTER — Encounter: Payer: Self-pay | Admitting: Gastroenterology

## 2017-04-05 ENCOUNTER — Telehealth: Payer: Self-pay | Admitting: Gastroenterology

## 2017-04-05 NOTE — Telephone Encounter (Signed)
Pt. States that Suprep will be $80 with the coupon and he cannot afford it.  I advised him to come by the office and pick up instructions for Miralax.  He will be here in 10 mins.  New instructions printed for patient and left at front desk.  B.Rehaan Viloria, CMA  [facility]

## 2017-04-07 ENCOUNTER — Other Ambulatory Visit: Payer: Self-pay | Admitting: Family Medicine

## 2017-04-07 DIAGNOSIS — K219 Gastro-esophageal reflux disease without esophagitis: Secondary | ICD-10-CM

## 2017-04-11 ENCOUNTER — Other Ambulatory Visit: Payer: Self-pay

## 2017-04-11 ENCOUNTER — Ambulatory Visit (AMBULATORY_SURGERY_CENTER): Payer: 59 | Admitting: Gastroenterology

## 2017-04-11 ENCOUNTER — Encounter: Payer: Self-pay | Admitting: Gastroenterology

## 2017-04-11 VITALS — BP 124/73 | HR 49 | Temp 97.3°F | Resp 19 | Ht 64.0 in | Wt 232.0 lb

## 2017-04-11 DIAGNOSIS — I10 Essential (primary) hypertension: Secondary | ICD-10-CM | POA: Diagnosis not present

## 2017-04-11 DIAGNOSIS — D123 Benign neoplasm of transverse colon: Secondary | ICD-10-CM

## 2017-04-11 DIAGNOSIS — D124 Benign neoplasm of descending colon: Secondary | ICD-10-CM

## 2017-04-11 DIAGNOSIS — Z1211 Encounter for screening for malignant neoplasm of colon: Secondary | ICD-10-CM

## 2017-04-11 DIAGNOSIS — K219 Gastro-esophageal reflux disease without esophagitis: Secondary | ICD-10-CM | POA: Diagnosis not present

## 2017-04-11 MED ORDER — SODIUM CHLORIDE 0.9 % IV SOLN: 500.0000 mL | Freq: Once | INTRAVENOUS | Status: DC

## 2017-04-11 NOTE — Progress Notes (Signed)
Pt's mother-in-law is his care partner.  She does not speak english or drive.  Pt was wide awake and alert.  Dr. Fuller Plan and I went over the finding with the pt and he states, "I understand".  Maw  No problems noted in the recovery room. maw

## 2017-04-11 NOTE — Op Note (Signed)
[facility] Patient Name: Matthew Garner Procedure Date: 04/11/2017 11:43 AM MRN: [id number] Endoscopist: Ladene Artist , MD Age: 57 Referring MD:  Date of Birth: 24-Aug-1960 Gender: Male Account #: [id number] Procedure:                Colonoscopy Indications:              Screening for colorectal malignant neoplasm Medicines:                Monitored Anesthesia Care Procedure:                Pre-Anesthesia Assessment:                           - Prior to the procedure, a History and Physical                            was performed, and patient medications and                            allergies were reviewed. The patient's tolerance of                            previous anesthesia was also reviewed. The risks                            and benefits of the procedure and the sedation                            options and risks were discussed with the patient.                            All questions were answered, and informed consent                            was obtained. Prior Anticoagulants: The patient has                            taken no previous anticoagulant or antiplatelet                            agents. ASA Grade Assessment: II - A patient with                            mild systemic disease. After reviewing the risks                            and benefits, the patient was deemed in                            satisfactory condition to undergo the procedure.                           After obtaining informed consent, the colonoscope  was passed under direct vision. Throughout the                            procedure, the patient's blood pressure, pulse, and                            oxygen saturations were monitored continuously. The                            Colonoscope was introduced through the anus and                            advanced to the the cecum, identified by                            appendiceal orifice and  ileocecal valve. The                            ileocecal valve, appendiceal orifice, and rectum                            were photographed. The quality of the bowel                            preparation was good. The colonoscopy was performed                            without difficulty. The patient tolerated the                            procedure well. Scope In: 11:53:08 AM Scope Out: 12:05:48 PM Scope Withdrawal Time: 0 hours 11 minutes 9 seconds  Total Procedure Duration: 0 hours 12 minutes 40 seconds  Findings:                 The perianal and digital rectal examinations were                            normal.                           Four sessile polyps were found in the descending                            colon (2) and hepatic flexure (2). The polyps were                            5 to 7 mm in size. These polyps were removed with a                            cold snare. Resection and retrieval were complete.                           Many medium-mouthed diverticula were found in the  left colon.                           Internal hemorrhoids were found during                            retroflexion. The hemorrhoids were medium-sized and                            Grade I (internal hemorrhoids that do not prolapse).                           The exam was otherwise without abnormality on                            direct and retroflexion views. Complications:            No immediate complications. Estimated blood loss:                            None. Estimated Blood Loss:     Estimated blood loss: none. Impression:               - Four 5 to 7 mm polyps in the descending colon and                            at the hepatic flexure, removed with a cold snare.                            Resected and retrieved.                           - Diverticulosis in the left colon.                           - Internal hemorrhoids.                           -  The examination was otherwise normal on direct                            and retroflexion views. Recommendation:           - Repeat colonoscopy in 3 - 5 years for                            surveillance if polyp(s) are precancerous, pending                            pathology review, otherwise 10 years.                           - Patient has a contact number available for                            emergencies. The signs and symptoms of potential  delayed complications were discussed with the                            patient. Return to normal activities tomorrow.                            Written discharge instructions were provided to the                            patient.                           - High fiber diet.                           - Continue present medications.                           - Await pathology results. Ladene Artist, MD 04/11/2017 12:12:03 PM This report has been signed electronically.

## 2017-04-11 NOTE — Patient Instructions (Signed)
YOU HAD AN ENDOSCOPIC PROCEDURE TODAY AT THE [facility]:   Refer to the procedure report that was given to you for any specific questions about what was found during the examination.  If the procedure report does not answer your questions, please call your gastroenterologist to clarify.  If you requested that your care partner not be given the details of your procedure findings, then the procedure report has been included in a sealed envelope for you to review at your convenience later.  YOU SHOULD EXPECT: Some feelings of bloating in the abdomen. Passage of more gas than usual.  Walking can help get rid of the air that was put into your GI tract during the procedure and reduce the bloating. If you had a lower endoscopy (such as a colonoscopy or flexible sigmoidoscopy) you may notice spotting of blood in your stool or on the toilet paper. If you underwent a bowel prep for your procedure, you may not have a normal bowel movement for a few days.  Please Note:  You might notice some irritation and congestion in your nose or some drainage.  This is from the oxygen used during your procedure.  There is no need for concern and it should clear up in a day or so.  SYMPTOMS TO REPORT IMMEDIATELY:   Following lower endoscopy (colonoscopy or flexible sigmoidoscopy):  Excessive amounts of blood in the stool  Significant tenderness or worsening of abdominal pains  Swelling of the abdomen that is new, acute  Fever of 100F or higher  For urgent or emergent issues, a gastroenterologist can be reached at any hour by calling [phone].   DIET:  We do recommend a small meal at first, but then you may proceed to your regular diet.  Drink plenty of fluids but you should avoid alcoholic beverages for 24 hours.  ACTIVITY:  You should plan to take it easy for the rest of today and you should NOT DRIVE or use heavy machinery until tomorrow (because of the sedation medicines used during the test).     FOLLOW UP: Our staff will call the number listed on your records the next business day following your procedure to check on you and address any questions or concerns that you may have regarding the information given to you following your procedure. If we do not reach you, we will leave a message.  However, if you are feeling well and you are not experiencing any problems, there is no need to return our call.  We will assume that you have returned to your regular daily activities without incident.  If any biopsies were taken you will be contacted by phone or by letter within the next 1-3 weeks.  Please call us at [phone] if you have not heard about the biopsies in 3 weeks.    SIGNATURES/CONFIDENTIALITY: You and/or your care partner have signed paperwork which will be entered into your electronic medical record.  These signatures attest to the fact that that the information above on your After Visit Summary has been reviewed and is understood.  Full responsibility of the confidentiality of this discharge information lies with you and/or your care-partner.    Handouts were given to your care partner on polyps, diverticulosis, hemorrhoids and a high fiber diet with liberal fluid intake. You may resume your current medications today. Await biopsy results. Please call if any questions or concerns.   

## 2017-04-11 NOTE — Progress Notes (Signed)
Report to PACU, RN, vss, BBS= Clear.  

## 2017-04-11 NOTE — Progress Notes (Signed)
Called to room to assist during endoscopic procedure.  Patient ID and intended procedure confirmed with present staff. Received instructions for my participation in the procedure from the performing physician.  

## 2017-04-12 ENCOUNTER — Other Ambulatory Visit: Payer: Self-pay | Admitting: Family Medicine

## 2017-04-12 ENCOUNTER — Telehealth: Payer: Self-pay

## 2017-04-12 ENCOUNTER — Telehealth: Payer: Self-pay | Admitting: *Deleted

## 2017-04-12 DIAGNOSIS — N529 Male erectile dysfunction, unspecified: Secondary | ICD-10-CM

## 2017-04-12 NOTE — Telephone Encounter (Signed)
Attempted to reach pt.with follow-up call following endoscopic procedure 04/11/17.  LM on pt.'s voice mail.  Will try to reach pt. Again later today.

## 2017-04-12 NOTE — Telephone Encounter (Signed)
  Follow up Call-  Call back number 04/11/2017  Post procedure Call Back phone  # [phone]  Permission to leave phone message Yes  Some recent data might be hidden     Patient questions:  Do you have a fever, pain , or abdominal swelling? No. Pain Score  0 *  Have you tolerated food without any problems? Yes.    Have you been able to return to your normal activities? Yes.    Do you have any questions about your discharge instructions: Diet   No. Medications  No. Follow up visit  No.  Do you have questions or concerns about your Care? No.  Actions: * If pain score is 4 or above: No action needed, pain <4.

## 2017-04-12 NOTE — Telephone Encounter (Signed)
Refill request Sildenafil (Viagra) 100 MG Take half (50 MG) daily, PRN  LOV requested medication was addressed 12/13/16  PCP [name] on file

## 2017-04-15 NOTE — Telephone Encounter (Signed)
Refilled

## 2017-04-17 ENCOUNTER — Ambulatory Visit (INDEPENDENT_AMBULATORY_CARE_PROVIDER_SITE_OTHER): Payer: 59 | Admitting: Physician Assistant

## 2017-04-17 VITALS — BP 132/77 | HR 61 | Temp 97.8°F | Ht 64.0 in | Wt 231.0 lb

## 2017-04-17 DIAGNOSIS — E119 Type 2 diabetes mellitus without complications: Secondary | ICD-10-CM | POA: Diagnosis not present

## 2017-04-17 DIAGNOSIS — Z6839 Body mass index (BMI) 39.0-39.9, adult: Secondary | ICD-10-CM

## 2017-04-17 NOTE — Progress Notes (Signed)
Office: [phone]  /  Fax: [phone]   HPI:   Chief Complaint: OBESITY Matthew Garner is here to discuss his progress with his obesity treatment plan. He is on the Category 2 plan and is following his eating plan approximately 95 % of the time. He states he is bike riding and walking for 10-20 minutes 2 times per week. Shant continues to do well with weight loss. He has noticed increased hunger in the evening.  His weight is 231 lb (104.8 kg) today and has had a weight loss of 1 pound over a period of 2 weeks since his last visit. He has lost 16 lbs since starting treatment with [name].  Diabetes II Matthew Garner has a diagnosis of diabetes type II. Kincaid states fasting BGs range between 90's and 110's and post prandial in 120's. He denies hypoglycemia. He has noticed increase in  Polyphagia especially in the evening. Last A1c was 6.8 on 02/04/17. He has been working on intensive lifestyle modifications including diet, exercise, and weight loss to help control his blood glucose levels.  ALLERGIES: No Known Allergies  MEDICATIONS: Current Outpatient Medications on File Prior to Visit  Medication Sig Dispense Refill  . atorvastatin (LIPITOR) 10 MG tablet Take 1 tablet (10 mg total) by mouth daily at 6 PM. 90 tablet 1  . Cholecalciferol (VITAMIN D3) 5000 units TABS Take 1 tablet (5,000 Units total) by mouth daily at 12 noon. 30 tablet   . hydrocortisone cream 1 % APP AA BID FOR 2 WEEKS  0  . losartan (COZAAR) 50 MG tablet Take 1 tablet (50 mg total) by mouth every morning. 90 tablet 1  . metFORMIN (GLUCOPHAGE) 500 MG tablet Take 1 tablet (500 mg total) by mouth daily with breakfast. (Patient taking differently: Take 500 mg by mouth 2 (two) times daily with a meal. ) 90 tablet 1  . ranitidine (ZANTAC) 300 MG tablet TAKE 1 TABLET BY MOUTH AT  BEDTIME 90 tablet 0  . sildenafil (VIAGRA) 100 MG tablet TAKE 1/2 TABLET BY MOUTH DAILY AS NEEDED FOR ERECTILE DYSFUNCTION. 5 tablet 0   Current Facility-Administered  Medications on File Prior to Visit  Medication Dose Route Frequency Provider Last Rate Last Dose  . 0.9 %  sodium chloride infusion  500 mL Intravenous Once Ladene Artist, MD        PAST MEDICAL HISTORY: Past Medical History:  Diagnosis Date  . Allergy    environmental  . Anemia   . Back pain   . Diabetes mellitus   . Environmental allergies 11/27/2011  . GERD (gastroesophageal reflux disease)   . Hyperlipidemia   . Hypertension   . Joint pain   . Knee pain, bilateral   . Sebaceous cyst 11/10/2012  . Snores     PAST SURGICAL HISTORY: Past Surgical History:  Procedure Laterality Date  . APPENDECTOMY    . KNEE SURGERY     right  . MASS EXCISION Bilateral 11/24/2012   Procedure: EXCISION SCALP MASS X2;  Surgeon: [name], MD;  Location: [facility];  Service: General;  Laterality: Bilateral;  . SPINE SURGERY  2004   lumb  . UPPER GASTROINTESTINAL ENDOSCOPY      SOCIAL HISTORY: Social History   Tobacco Use  . Smoking status: Never Smoker  . Smokeless tobacco: Never Used  Substance Use Topics  . Alcohol use: No  . Drug use: No    FAMILY HISTORY: Family History  Problem Relation Age of Onset  . Colon cancer  Neg Hx   . Colon polyps Neg Hx     ROS: Review of Systems  Constitutional: Positive for weight loss.  Endo/Heme/Allergies:       Negative hypoglycemia Positive polyphagia    PHYSICAL EXAM: Blood pressure 132/77, pulse 61, temperature 97.8 F (36.6 C), height 5\' 4"  (1.626 m), weight 231 lb (104.8 kg), SpO2 100 %. Body mass index is 39.65 kg/m. Physical Exam  Constitutional: He is oriented to person, place, and time. He appears well-developed and well-nourished.  Cardiovascular: Normal rate.  Pulmonary/Chest: Effort normal.  Musculoskeletal: Normal range of motion.  Neurological: He is oriented to person, place, and time.  Skin: Skin is warm and dry.  Psychiatric: He has a normal mood and affect. His behavior is normal.    Vitals reviewed.   RECENT LABS AND TESTS: BMET    Component Value Date/Time   NA 139 02/04/2017 1035   K 4.6 02/04/2017 1035   CL 97 02/04/2017 1035   CO2 25 02/04/2017 1035   GLUCOSE 113 (H) 02/04/2017 1035   GLUCOSE 130 (H) 08/20/2016 0926   BUN 12 02/04/2017 1035   CREATININE 0.79 02/04/2017 1035   CREATININE 0.76 09/15/2015 1525   CALCIUM 9.7 02/04/2017 1035   GFRNONAA 102 02/04/2017 1035   GFRNONAA >89 09/15/2015 1525   GFRAA 118 02/04/2017 1035   GFRAA >89 09/15/2015 1525   Lab Results  Component Value Date   HGBA1C 6.8 (H) 02/04/2017   HGBA1C 6.8 (H) 12/13/2016   HGBA1C 7.1 (H) 09/13/2016   HGBA1C 7.0 (H) 06/07/2016   HGBA1C 6.5 (H) 12/22/2015   Lab Results  Component Value Date   INSULIN 15.5 02/04/2017   CBC    Component Value Date/Time   WBC 6.2 02/04/2017 1035   WBC 7.8 08/20/2016 0926   RBC 5.27 02/04/2017 1035   RBC 5.13 08/20/2016 0926   HGB 16.0 02/04/2017 1035   HCT 46.1 02/04/2017 1035   PLT 279 08/20/2016 0926   MCV 88 02/04/2017 1035   MCH 30.4 02/04/2017 1035   MCH 30.0 08/20/2016 0926   MCHC 34.7 02/04/2017 1035   MCHC 35.5 08/20/2016 0926   RDW 13.4 02/04/2017 1035   LYMPHSABS 1.5 02/04/2017 1035   MONOABS 0.7 08/20/2016 0926   EOSABS 0.1 02/04/2017 1035   BASOSABS 0.1 02/04/2017 1035   Iron/TIBC/Ferritin/ %Sat No results found for: IRON, TIBC, FERRITIN, IRONPCTSAT Lipid Panel     Component Value Date/Time   CHOL 145 02/04/2017 1035   TRIG 77 02/04/2017 1035   HDL 61 02/04/2017 1035   CHOLHDL 2.6 12/13/2016 1632   CHOLHDL 3.0 09/15/2015 1525   VLDL 13 09/15/2015 1525   LDLCALC 69 02/04/2017 1035   Hepatic Function Panel     Component Value Date/Time   PROT 7.3 02/04/2017 1035   ALBUMIN 4.6 02/04/2017 1035   AST 17 02/04/2017 1035   ALT 21 02/04/2017 1035   ALKPHOS 84 02/04/2017 1035   BILITOT 0.4 02/04/2017 1035      Component Value Date/Time   TSH 1.620 02/04/2017 1035    ASSESSMENT AND PLAN: Type 2 diabetes  mellitus without complication, without long-term current use of insulin (HCC)  Class 2 severe obesity with serious comorbidity and body mass index (BMI) of 39.0 to 39.9 in adult, unspecified obesity type ([place])  Diabetes mellitus without complication ([place])  PLAN:  Diabetes II Rylan has been given extensive diabetes education by myself today including ideal fasting and post-prandial blood glucose readings, individual ideal Hgb A1c goals and hypoglycemia  prevention. We discussed the importance of good blood sugar control to decrease the likelihood of diabetic complications such as nephropathy, neuropathy, limb loss, blindness, coronary artery disease, and death. We discussed the importance of intensive lifestyle modification including diet, exercise and weight loss as the first line treatment for diabetes.To help with increased polyphagia in the evening, Shadd agrees to increase metformin to 500 mg BID (he has medicine at home, no refill needed). Delmus agrees to follow up with our clinic in 2 weeks.  We spent > than 50% of the 15 minute visit on the counseling as documented in the note.  Obesity Calvin is currently in the action stage of change. As such, his goal is to continue with weight loss efforts He has agreed to follow the Category 2 plan Malaky has been instructed to work up to a goal of 150 minutes of combined cardio and strengthening exercise per week for weight loss and overall health benefits. We discussed the following Behavioral Modification Strategies today: increasing lean protein intake and work on meal planning and easy cooking plans   Culley has agreed to follow up with our clinic in 2 weeks. He was informed of the importance of frequent follow up visits to maximize his success with intensive lifestyle modifications for his multiple health conditions.   OBESITY BEHAVIORAL INTERVENTION VISIT  Today's visit was # 6 out of 22.  Starting weight: 247 lbs Starting date:  02/04/17 Today's weight : 231 lbs  Today's date: 04/17/2017 Total lbs lost to date: 16 (Patients must lose 7 lbs in the first 6 months to continue with counseling)   ASK: We discussed the diagnosis of obesity with Wallace Keller today and Khyri agreed to give [name] permission to discuss obesity behavioral modification therapy today.  ASSESS: Kacy has the diagnosis of obesity and his BMI today is 39.63 Bertrand is in the action stage of change   ADVISE: Dvontae was educated on the multiple health risks of obesity as well as the benefit of weight loss to improve his health. He was advised of the need for long term treatment and the importance of lifestyle modifications.  AGREE: Multiple dietary modification options and treatment options were discussed and  Visente agreed to the above obesity treatment plan.   Wilhemena Durie, am acting as transcriptionist for [employer], PA-C

## 2017-04-19 ENCOUNTER — Encounter: Payer: Self-pay | Admitting: Gastroenterology

## 2017-04-26 ENCOUNTER — Encounter: Payer: Self-pay | Admitting: Gastroenterology

## 2017-05-02 ENCOUNTER — Ambulatory Visit (INDEPENDENT_AMBULATORY_CARE_PROVIDER_SITE_OTHER): Payer: 59 | Admitting: Physician Assistant

## 2017-05-02 VITALS — BP 133/76 | HR 55 | Temp 97.6°F | Ht 64.0 in | Wt 229.0 lb

## 2017-05-02 DIAGNOSIS — Z6839 Body mass index (BMI) 39.0-39.9, adult: Secondary | ICD-10-CM | POA: Diagnosis not present

## 2017-05-02 DIAGNOSIS — I10 Essential (primary) hypertension: Secondary | ICD-10-CM | POA: Diagnosis not present

## 2017-05-06 ENCOUNTER — Encounter: Payer: 59 | Admitting: Gastroenterology

## 2017-05-06 NOTE — Progress Notes (Signed)
Office: [phone]  /  Fax: [phone]   HPI:   Chief Complaint: OBESITY Matthew Garner is here to discuss his progress with his obesity treatment plan. He is on the Category 2 plan and is following his eating plan approximately 95 % of the time. He states he is walking and biking for 15 to 30 minutes 2 times per week. Matthew Garner continues to do well with weight loss. He would like meal ideas for upcoming religious holiday, Ramadan. His weight is 229 lb (103.9 kg) today and has had a weight loss of 2 pounds over a period of 2 weeks since his last visit. He has lost 18 lbs since starting treatment with Matthew Garner.  Hypertension Matthew Garner is a 57 y.o. male with hypertension and is on lisinopril. Matthew Garner denies chest pain or shortness of breath on exertion. He is working weight loss to help control his blood pressure with the goal of decreasing his risk of heart attack and stroke. Matthew Garner blood pressure is currently stable.  ALLERGIES: No Known Allergies  MEDICATIONS: Current Outpatient Medications on File Prior to Visit  Medication Sig Dispense Refill  . atorvastatin (LIPITOR) 10 MG tablet Take 1 tablet (10 mg total) by mouth daily at 6 PM. 90 tablet 1  . Cholecalciferol (VITAMIN D3) 5000 units TABS Take 1 tablet (5,000 Units total) by mouth daily at 12 noon. 30 tablet   . hydrocortisone cream 1 % APP AA BID FOR 2 WEEKS  0  . losartan (COZAAR) 50 MG tablet Take 1 tablet (50 mg total) by mouth every morning. 90 tablet 1  . metFORMIN (GLUCOPHAGE) 500 MG tablet Take 1 tablet (500 mg total) by mouth daily with breakfast. (Patient taking differently: Take 500 mg by mouth 2 (two) times daily with a meal. ) 90 tablet 1  . ranitidine (ZANTAC) 300 MG tablet TAKE 1 TABLET BY MOUTH AT  BEDTIME 90 tablet 0  . sildenafil (VIAGRA) 100 MG tablet TAKE 1/2 TABLET BY MOUTH DAILY AS NEEDED FOR ERECTILE DYSFUNCTION. 5 tablet 0   Current Facility-Administered Medications on File Prior to Visit  Medication Dose Route  Frequency Provider Last Rate Last Dose  . 0.9 %  sodium chloride infusion  500 mL Intravenous Once Ladene Artist, MD        PAST MEDICAL HISTORY: Past Medical History:  Diagnosis Date  . Allergy    environmental  . Anemia   . Back pain   . Diabetes mellitus   . Environmental allergies 11/27/2011  . GERD (gastroesophageal reflux disease)   . Hyperlipidemia   . Hypertension   . Joint pain   . Knee pain, bilateral   . Sebaceous cyst 11/10/2012  . Snores     PAST SURGICAL HISTORY: Past Surgical History:  Procedure Laterality Date  . APPENDECTOMY    . KNEE SURGERY     right  . MASS EXCISION Bilateral 11/24/2012   Procedure: EXCISION SCALP MASS X2;  Surgeon: [name], MD;  Location: [facility];  Service: General;  Laterality: Bilateral;  . SPINE SURGERY  2004   lumb  . UPPER GASTROINTESTINAL ENDOSCOPY      SOCIAL HISTORY: Social History   Tobacco Use  . Smoking status: Never Smoker  . Smokeless tobacco: Never Used  Substance Use Topics  . Alcohol use: No  . Drug use: No    FAMILY HISTORY: Family History  Problem Relation Age of Onset  . Colon cancer Neg Hx   . Colon polyps Neg Hx  ROS: Review of Systems  Constitutional: Positive for weight loss.  Respiratory: Negative for shortness of breath (on exertion).   Cardiovascular: Negative for chest pain.    PHYSICAL EXAM: Blood pressure 133/76, pulse (!) 55, temperature 97.6 F (36.4 C), temperature source Oral, height 5\' 4"  (1.626 m), weight 229 lb (103.9 kg), SpO2 98 %. Body mass index is 39.31 kg/m. Physical Exam  Constitutional: He is oriented to person, place, and time. He appears well-developed and well-nourished.  Cardiovascular: Bradycardia present.  Pulmonary/Chest: Effort normal.  Musculoskeletal: Normal range of motion.  Neurological: He is oriented to person, place, and time.  Skin: Skin is warm and dry.  Psychiatric: He has a normal mood and affect. His behavior  is normal.  Vitals reviewed.   RECENT LABS AND TESTS: BMET    Component Value Date/Time   NA 139 02/04/2017 1035   K 4.6 02/04/2017 1035   CL 97 02/04/2017 1035   CO2 25 02/04/2017 1035   GLUCOSE 113 (H) 02/04/2017 1035   GLUCOSE 130 (H) 08/20/2016 0926   BUN 12 02/04/2017 1035   CREATININE 0.79 02/04/2017 1035   CREATININE 0.76 09/15/2015 1525   CALCIUM 9.7 02/04/2017 1035   GFRNONAA 102 02/04/2017 1035   GFRNONAA >89 09/15/2015 1525   GFRAA 118 02/04/2017 1035   GFRAA >89 09/15/2015 1525   Lab Results  Component Value Date   HGBA1C 6.8 (H) 02/04/2017   HGBA1C 6.8 (H) 12/13/2016   HGBA1C 7.1 (H) 09/13/2016   HGBA1C 7.0 (H) 06/07/2016   HGBA1C 6.5 (H) 12/22/2015   Lab Results  Component Value Date   INSULIN 15.5 02/04/2017   CBC    Component Value Date/Time   WBC 6.2 02/04/2017 1035   WBC 7.8 08/20/2016 0926   RBC 5.27 02/04/2017 1035   RBC 5.13 08/20/2016 0926   HGB 16.0 02/04/2017 1035   HCT 46.1 02/04/2017 1035   PLT 279 08/20/2016 0926   MCV 88 02/04/2017 1035   MCH 30.4 02/04/2017 1035   MCH 30.0 08/20/2016 0926   MCHC 34.7 02/04/2017 1035   MCHC 35.5 08/20/2016 0926   RDW 13.4 02/04/2017 1035   LYMPHSABS 1.5 02/04/2017 1035   MONOABS 0.7 08/20/2016 0926   EOSABS 0.1 02/04/2017 1035   BASOSABS 0.1 02/04/2017 1035   Iron/TIBC/Ferritin/ %Sat No results found for: IRON, TIBC, FERRITIN, IRONPCTSAT Lipid Panel     Component Value Date/Time   CHOL 145 02/04/2017 1035   TRIG 77 02/04/2017 1035   HDL 61 02/04/2017 1035   CHOLHDL 2.6 12/13/2016 1632   CHOLHDL 3.0 09/15/2015 1525   VLDL 13 09/15/2015 1525   LDLCALC 69 02/04/2017 1035   Hepatic Function Panel     Component Value Date/Time   PROT 7.3 02/04/2017 1035   ALBUMIN 4.6 02/04/2017 1035   AST 17 02/04/2017 1035   ALT 21 02/04/2017 1035   ALKPHOS 84 02/04/2017 1035   BILITOT 0.4 02/04/2017 1035      Component Value Date/Time   TSH 1.620 02/04/2017 1035   Results for BRYER, COZZOLINO (MRN  [id number]) as of 05/06/2017 09:54  Ref. Range 02/04/2017 10:35  Vitamin D, 25-Hydroxy Latest Ref Range: 30.0 - 100.0 ng/mL 14.2 (L)   ASSESSMENT AND PLAN: Essential hypertension  Class 2 severe obesity with serious comorbidity and body mass index (BMI) of 39.0 to 39.9 in adult, unspecified obesity type (HCC)  PLAN:  Hypertension We discussed sodium restriction, working on healthy weight loss, and a regular exercise program as the means to achieve improved  blood pressure control. Rambo agreed with this plan and agreed to follow up as directed. We will continue to monitor his blood pressure as well as his progress with the above lifestyle modifications. He will continue his medications as prescribed and will watch for signs of hypotension as he continues his lifestyle modifications.  We spent > than 50% of the 15 minute visit on the counseling as documented in the note.  Obesity Matthew Garner is currently in the action stage of change. As such, his goal is to continue with weight loss efforts He has agreed to portion control better and make smarter food choices, such as increase vegetables and decrease simple carbohydrates  Matthew Garner has been instructed to work up to a goal of 150 minutes of combined cardio and strengthening exercise per week for weight loss and overall health benefits. We discussed the following Behavioral Modification Strategies today: holiday eating strategies  and decrease junk food  Matthew Garner has agreed to follow up with our clinic in 3 weeks. He was informed of the importance of frequent follow up visits to maximize his success with intensive lifestyle modifications for his multiple health conditions.    OBESITY BEHAVIORAL INTERVENTION VISIT  Today's visit was # 7 out of 22.  Starting weight: 247 lbs Starting date: 02/04/17 Today's weight : 229 lbs  Today's date: 05/02/2017 Total lbs lost to date: 7 (Patients must lose 7 lbs in the first 6 months to continue with  counseling)   ASK: We discussed the diagnosis of obesity with Matthew Garner today and Arvil agreed to give Matthew Garner permission to discuss obesity behavioral modification therapy today.  ASSESS: Tyrrell has the diagnosis of obesity and his BMI today is 39.29 Navarro is in the action stage of change   ADVISE: Kayzen was educated on the multiple health risks of obesity as well as the benefit of weight loss to improve his health. He was advised of the need for long term treatment and the importance of lifestyle modifications.  AGREE: Multiple dietary modification options and treatment options were discussed and  Tyron agreed to the above obesity treatment plan.   Corey Skains, am acting as transcriptionist for [employer], PA-C I, Lacy Duverney [facility], have reviewed this note and agree with its content

## 2017-05-13 DIAGNOSIS — M1711 Unilateral primary osteoarthritis, right knee: Secondary | ICD-10-CM | POA: Diagnosis not present

## 2017-05-13 DIAGNOSIS — M1712 Unilateral primary osteoarthritis, left knee: Secondary | ICD-10-CM | POA: Diagnosis not present

## 2017-05-13 DIAGNOSIS — M170 Bilateral primary osteoarthritis of knee: Secondary | ICD-10-CM | POA: Diagnosis not present

## 2017-05-23 ENCOUNTER — Ambulatory Visit (INDEPENDENT_AMBULATORY_CARE_PROVIDER_SITE_OTHER): Payer: 59 | Admitting: Physician Assistant

## 2017-05-23 ENCOUNTER — Encounter (INDEPENDENT_AMBULATORY_CARE_PROVIDER_SITE_OTHER): Payer: Self-pay | Admitting: Physician Assistant

## 2017-05-23 VITALS — BP 122/71 | HR 51 | Temp 98.0°F | Ht 64.0 in | Wt 225.0 lb

## 2017-05-23 DIAGNOSIS — Z6838 Body mass index (BMI) 38.0-38.9, adult: Secondary | ICD-10-CM

## 2017-05-23 DIAGNOSIS — I10 Essential (primary) hypertension: Secondary | ICD-10-CM

## 2017-05-23 NOTE — Progress Notes (Signed)
Office: [phone]  /  Fax: [phone]   HPI:   Chief Complaint: OBESITY Matthew Garner is here to discuss his progress with his obesity treatment plan. He is on the portion control better and make smarter food choices, such as increase vegetables and decrease simple carbohydrates and is following his eating plan approximately 50 % of the time. He states he is exercising 0 minutes 0 times per week. Matthew Garner continues to do well with weight loss. He is fasting for a religious holiday in which he changed his eating. He is incorporating more lean protein and vegetables.  His weight is 225 lb (102.1 kg) today and has had a weight loss of 5 pounds over a period of 3 weeks since his last visit. He has lost 22 lbs since starting treatment with [name].  Hypertension Matthew Garner is a 57 y.o. male with hypertension. Matthew Garner's blood pressure is stable. He denies chest pain or shortness of breath. He is working weight loss to help control his blood pressure with the goal of decreasing his risk of heart attack and stroke. Matthew Garner's blood pressure is currently controlled.  ALLERGIES: No Known Allergies  MEDICATIONS: Current Outpatient Medications on File Prior to Visit  Medication Sig Dispense Refill  . atorvastatin (LIPITOR) 10 MG tablet Take 1 tablet (10 mg total) by mouth daily at 6 PM. 90 tablet 1  . Cholecalciferol (VITAMIN D3) 5000 units TABS Take 1 tablet (5,000 Units total) by mouth daily at 12 noon. 30 tablet   . hydrocortisone cream 1 % APP AA BID FOR 2 WEEKS  0  . losartan (COZAAR) 50 MG tablet Take 1 tablet (50 mg total) by mouth every morning. 90 tablet 1  . metFORMIN (GLUCOPHAGE) 500 MG tablet Take 1 tablet (500 mg total) by mouth daily with breakfast. (Patient taking differently: Take 500 mg by mouth 2 (two) times daily with a meal. ) 90 tablet 1  . ranitidine (ZANTAC) 300 MG tablet TAKE 1 TABLET BY MOUTH AT  BEDTIME 90 tablet 0  . sildenafil (VIAGRA) 100 MG tablet TAKE 1/2 TABLET BY MOUTH DAILY AS  NEEDED FOR ERECTILE DYSFUNCTION. 5 tablet 0   Current Facility-Administered Medications on File Prior to Visit  Medication Dose Route Frequency Provider Last Rate Last Dose  . 0.9 %  sodium chloride infusion  500 mL Intravenous Once Matthew Artist, MD        PAST MEDICAL HISTORY: Past Medical History:  Diagnosis Date  . Allergy    environmental  . Anemia   . Back pain   . Diabetes mellitus   . Environmental allergies 11/27/2011  . GERD (gastroesophageal reflux disease)   . Hyperlipidemia   . Hypertension   . Joint pain   . Knee pain, bilateral   . Sebaceous cyst 11/10/2012  . Snores     PAST SURGICAL HISTORY: Past Surgical History:  Procedure Laterality Date  . APPENDECTOMY    . KNEE SURGERY     right  . MASS EXCISION Bilateral 11/24/2012   Procedure: EXCISION SCALP MASS X2;  Surgeon: [name], MD;  Location: [facility];  Service: General;  Laterality: Bilateral;  . SPINE SURGERY  2004   lumb  . UPPER GASTROINTESTINAL ENDOSCOPY      SOCIAL HISTORY: Social History   Tobacco Use  . Smoking status: Never Smoker  . Smokeless tobacco: Never Used  Substance Use Topics  . Alcohol use: No  . Drug use: No    FAMILY HISTORY: Family History  Problem Relation Age of Onset  . Colon cancer Neg Hx   . Colon polyps Neg Hx     ROS: Review of Systems  Constitutional: Positive for weight loss.  Respiratory: Negative for shortness of breath.   Cardiovascular: Negative for chest pain.    PHYSICAL EXAM: Blood pressure 122/71, pulse (!) 51, temperature 98 F (36.7 C), temperature source Oral, height 5\' 4"  (1.626 m), weight 225 lb (102.1 kg), SpO2 98 %. Body mass index is 38.62 kg/m. Physical Exam  Constitutional: He is oriented to person, place, and time. He appears well-developed and well-nourished.  Cardiovascular: Normal rate.  Pulmonary/Chest: Effort normal.  Musculoskeletal: Normal range of motion.  Neurological: He is oriented to  person, place, and time.  Skin: Skin is warm and dry.  Psychiatric: He has a normal mood and affect. His behavior is normal.  Vitals reviewed.   RECENT LABS AND TESTS: BMET    Component Value Date/Time   NA 139 02/04/2017 1035   K 4.6 02/04/2017 1035   CL 97 02/04/2017 1035   CO2 25 02/04/2017 1035   GLUCOSE 113 (H) 02/04/2017 1035   GLUCOSE 130 (H) 08/20/2016 0926   BUN 12 02/04/2017 1035   CREATININE 0.79 02/04/2017 1035   CREATININE 0.76 09/15/2015 1525   CALCIUM 9.7 02/04/2017 1035   GFRNONAA 102 02/04/2017 1035   GFRNONAA >89 09/15/2015 1525   GFRAA 118 02/04/2017 1035   GFRAA >89 09/15/2015 1525   Lab Results  Component Value Date   HGBA1C 6.8 (H) 02/04/2017   HGBA1C 6.8 (H) 12/13/2016   HGBA1C 7.1 (H) 09/13/2016   HGBA1C 7.0 (H) 06/07/2016   HGBA1C 6.5 (H) 12/22/2015   Lab Results  Component Value Date   INSULIN 15.5 02/04/2017   CBC    Component Value Date/Time   WBC 6.2 02/04/2017 1035   WBC 7.8 08/20/2016 0926   RBC 5.27 02/04/2017 1035   RBC 5.13 08/20/2016 0926   HGB 16.0 02/04/2017 1035   HCT 46.1 02/04/2017 1035   PLT 279 08/20/2016 0926   MCV 88 02/04/2017 1035   MCH 30.4 02/04/2017 1035   MCH 30.0 08/20/2016 0926   MCHC 34.7 02/04/2017 1035   MCHC 35.5 08/20/2016 0926   RDW 13.4 02/04/2017 1035   LYMPHSABS 1.5 02/04/2017 1035   MONOABS 0.7 08/20/2016 0926   EOSABS 0.1 02/04/2017 1035   BASOSABS 0.1 02/04/2017 1035   Iron/TIBC/Ferritin/ %Sat No results found for: IRON, TIBC, FERRITIN, IRONPCTSAT Lipid Panel     Component Value Date/Time   CHOL 145 02/04/2017 1035   TRIG 77 02/04/2017 1035   HDL 61 02/04/2017 1035   CHOLHDL 2.6 12/13/2016 1632   CHOLHDL 3.0 09/15/2015 1525   VLDL 13 09/15/2015 1525   LDLCALC 69 02/04/2017 1035   Hepatic Function Panel     Component Value Date/Time   PROT 7.3 02/04/2017 1035   ALBUMIN 4.6 02/04/2017 1035   AST 17 02/04/2017 1035   ALT 21 02/04/2017 1035   ALKPHOS 84 02/04/2017 1035   BILITOT  0.4 02/04/2017 1035      Component Value Date/Time   TSH 1.620 02/04/2017 1035    ASSESSMENT AND PLAN: Essential hypertension  Class 2 severe obesity with serious comorbidity and body mass index (BMI) of 38.0 to 38.9 in adult, unspecified obesity type (HCC)  PLAN:  Hypertension We discussed sodium restriction, working on healthy weight loss, and a regular exercise program as the means to achieve improved blood pressure control. Jevaughn agreed with this plan and  agreed to follow up as directed. We will continue to monitor his blood pressure as well as his progress with the above lifestyle modifications. He will continue his medications as prescribed and will watch for signs of hypotension as he continues his lifestyle modifications. Korver agrees to follow up with our clinic in 3 weeks.  We spent > than 50% of the 15 minute visit on the counseling as documented in the note.  Obesity Steed is currently in the action stage of change. As such, his goal is to continue with weight loss efforts He has agreed to follow the Category 2 plan Abdalrahman has been instructed to work up to a goal of 150 minutes of combined cardio and strengthening exercise per week for weight loss and overall health benefits. We discussed the following Behavioral Modification Strategies today: increasing lean protein intake and work on meal planning and easy cooking plans   Orva has agreed to follow up with our clinic in 3 weeks. He was informed of the importance of frequent follow up visits to maximize his success with intensive lifestyle modifications for his multiple health conditions.   OBESITY BEHAVIORAL INTERVENTION VISIT  Today's visit was # 8 out of 22.  Starting weight: 247 lbs Starting date: 02/04/17 Today's weight : 225 lbs Today's date: 05/23/2017 Total lbs lost to date: 22 (Patients must lose 7 lbs in the first 6 months to continue with counseling)   ASK: We discussed the diagnosis of obesity with  Wallace Keller today and Aarion agreed to give [name] permission to discuss obesity behavioral modification therapy today.  ASSESS: Blain has the diagnosis of obesity and his BMI today is 38.6 Les is in the action stage of change   ADVISE: Jamareon was educated on the multiple health risks of obesity as well as the benefit of weight loss to improve his health. He was advised of the need for long term treatment and the importance of lifestyle modifications.  AGREE: Multiple dietary modification options and treatment options were discussed and  Arslan agreed to the above obesity treatment plan.   Matthew Garner, am acting as transcriptionist for Lacy Duverney, PA-C I, Lacy Duverney [facility], have reviewed this note and agree with its content

## 2017-05-27 ENCOUNTER — Encounter: Payer: Self-pay | Admitting: Family Medicine

## 2017-05-28 ENCOUNTER — Other Ambulatory Visit: Payer: Self-pay | Admitting: Family Medicine

## 2017-05-28 DIAGNOSIS — N529 Male erectile dysfunction, unspecified: Secondary | ICD-10-CM

## 2017-05-28 NOTE — Telephone Encounter (Signed)
Rx refill request: Sildenafil 100 mg       Last filled : 04/15/17 # 5  LOV: 03/05/17 ( medication f/u)   Patient has appt- 06/11/17  PCP: [place]: verified

## 2017-05-28 NOTE — Telephone Encounter (Signed)
Refill?  Please see message below.

## 2017-05-29 NOTE — Telephone Encounter (Signed)
Refilled.  Can discuss at upcoming appointment.

## 2017-06-11 ENCOUNTER — Encounter: Payer: Self-pay | Admitting: Family Medicine

## 2017-06-11 ENCOUNTER — Ambulatory Visit (INDEPENDENT_AMBULATORY_CARE_PROVIDER_SITE_OTHER): Payer: 59 | Admitting: Family Medicine

## 2017-06-11 VITALS — BP 136/76 | HR 53 | Temp 98.6°F | Resp 17 | Ht 64.0 in | Wt 227.0 lb

## 2017-06-11 DIAGNOSIS — Z23 Encounter for immunization: Secondary | ICD-10-CM

## 2017-06-11 DIAGNOSIS — E785 Hyperlipidemia, unspecified: Secondary | ICD-10-CM | POA: Diagnosis not present

## 2017-06-11 DIAGNOSIS — Z125 Encounter for screening for malignant neoplasm of prostate: Secondary | ICD-10-CM | POA: Diagnosis not present

## 2017-06-11 DIAGNOSIS — Z0000 Encounter for general adult medical examination without abnormal findings: Secondary | ICD-10-CM

## 2017-06-11 DIAGNOSIS — N529 Male erectile dysfunction, unspecified: Secondary | ICD-10-CM | POA: Diagnosis not present

## 2017-06-11 DIAGNOSIS — I10 Essential (primary) hypertension: Secondary | ICD-10-CM | POA: Diagnosis not present

## 2017-06-11 DIAGNOSIS — K219 Gastro-esophageal reflux disease without esophagitis: Secondary | ICD-10-CM

## 2017-06-11 DIAGNOSIS — E119 Type 2 diabetes mellitus without complications: Secondary | ICD-10-CM

## 2017-06-11 MED ORDER — LOSARTAN POTASSIUM 50 MG PO TABS: 50.0000 mg | ORAL_TABLET | Freq: Every morning | ORAL | 1 refills | Status: DC

## 2017-06-11 MED ORDER — METFORMIN HCL 500 MG PO TABS: 500.0000 mg | ORAL_TABLET | Freq: Every day | ORAL | 1 refills | Status: DC

## 2017-06-11 MED ORDER — SILDENAFIL CITRATE 100 MG PO TABS: ORAL_TABLET | ORAL | 3 refills | Status: DC

## 2017-06-11 MED ORDER — ZOSTER VAC RECOMB ADJUVANTED 50 MCG/0.5ML IM SUSR: 0.5000 mL | Freq: Once | INTRAMUSCULAR | 1 refills | Status: AC

## 2017-06-11 MED ORDER — ATORVASTATIN CALCIUM 10 MG PO TABS: 10.0000 mg | ORAL_TABLET | Freq: Every day | ORAL | 1 refills | Status: DC

## 2017-06-11 NOTE — Progress Notes (Signed)
Subjective:  By signing my name below, I, Matthew Garner, attest that this documentation has been prepared under the direction and in the presence of [name], MD. Electronically Signed: Moises Garner, [place]. 06/11/2017 , 4:10 PM .  Patient was seen in Room 9 .   Patient ID: Matthew Garner, male    DOB: 1960/10/07, 57 y.o.   MRN: [id number] Chief Complaint  Patient presents with  . Annual Exam    urine collected    HPI Matthew Garner is a 57 y.o. male Here for annual physical. He has a history of HTN, diabetes and hyperlipidemia.   Diabetes Lab Results  Component Value Date   HGBA1C 6.8 (H) 02/04/2017   Lab Results  Component Value Date   MICROALBUR 2.5 (H) 08/03/2014   He had micro albumin creatinine ratio of 13.6 in Feb. He takes metformin 500 mg qd.   Wt Readings from Last 3 Encounters:  06/11/17 227 lb (103 kg)  05/23/17 225 lb (102.1 kg)  05/02/17 229 lb (103.9 kg)   Optho: 08/02/16 Foot exam: 09/13/16 Pneumovax: June 2018  Heartburn  He takes Zantac 300 mg qhs.   Erectile dysfunction He doesn't always have to take Viagra. When he does take it, he only uses half pill. He denies side effects including blue tint vision, headache or hearing loss.   HTN BP Readings from Last 3 Encounters:  06/11/17 136/76  05/23/17 122/71  05/02/17 133/76   Lab Results  Component Value Date   CREATININE 0.79 02/04/2017   He takes Losartan 50 mg qd.   Hyperlipidemia Lab Results  Component Value Date   CHOL 145 02/04/2017   HDL 61 02/04/2017   LDLCALC 69 02/04/2017   TRIG 77 02/04/2017   CHOLHDL 2.6 12/13/2016   Lab Results  Component Value Date   ALT 21 02/04/2017   AST 17 02/04/2017   ALKPHOS 84 02/04/2017   BILITOT 0.4 02/04/2017   He takes Lipitor 10 mg qd.   Cancer Screening Colonoscopy: done on 04/11/17 by Dr. Fuller Plan; he had 4 polyps removed, tubular adenomas, repeat in 3 years.  Prostate cancer screening: Agrees to testing today.  Lab Results  Component  Value Date   PSA 1.34 12/21/2014   PSA 1.01 05/06/2011    Immunizations Immunization History  Administered Date(s) Administered  . Influenza-Unspecified 10/10/2015  . Pneumococcal Polysaccharide-23 06/07/2016   Shingles: sent to pharmacist to check cost.   Depression Depression screen [facility] 2/9 06/11/2017 03/05/2017 02/04/2017 12/13/2016 09/13/2016  Decreased Interest 0 0 1 0 0  Down, Depressed, Hopeless 0 0 3 0 0  PHQ - 2 Score 0 0 4 0 0  Altered sleeping - - 3 - -  Tired, decreased energy - - 3 - -  Change in appetite - - 3 - -  Feeling bad or failure about yourself  - - 3 - -  Trouble concentrating - - 3 - -  Moving slowly or fidgety/restless - - 3 - -  Suicidal thoughts - - 3 - -  PHQ-9 Score - - 25 - -  Difficult doing work/chores - - Extremely dIfficult - -     Vision  Visual Acuity Screening   Right eye Left eye Both eyes  Without correction: 20/20 20/20 20/20   With correction:      Last seen ophthalmologist on 08/02/16.   Dentist He had cleaning done 2 months ago.   Exercise He goes to the gym about two times a week. He's followed by Dr. Leafy Ro  with medical weight management.   Patient Active Problem List   Diagnosis Date Noted  . Type 2 diabetes mellitus without complication, without long-term current use of insulin ([place]) 03/04/2017  . Vitamin D deficiency 03/04/2017  . Other fatigue 02/04/2017  . Shortness of breath on exertion 02/04/2017  . Knee pain, bilateral 03/29/2015  . Nonspecific abnormal electrocardiogram (ECG) (EKG) 04/21/2014  . Spinal stenosis of lumbar region 10/27/2013  . Pain of left calf 06/16/2013  . Diabetes ([place]) 11/27/2011  . Hyperlipidemia 11/27/2011  . Hypertension 11/27/2011   Past Medical History:  Diagnosis Date  . Allergy    environmental  . Anemia   . Back pain   . Diabetes mellitus   . Environmental allergies 11/27/2011  . GERD (gastroesophageal reflux disease)   . Hyperlipidemia   . Hypertension   . Joint pain   .  Knee pain, bilateral   . Sebaceous cyst 11/10/2012  . Snores    Past Surgical History:  Procedure Laterality Date  . APPENDECTOMY    . KNEE SURGERY     right  . MASS EXCISION Bilateral 11/24/2012   Procedure: EXCISION SCALP MASS X2;  Garner: Matthew Bowie, MD;  Location: [facility];  Service: General;  Laterality: Bilateral;  . SPINE SURGERY  2004   lumb  . UPPER GASTROINTESTINAL ENDOSCOPY     No Known Allergies Prior to Admission medications   Medication Sig Start Date End Date Taking? Authorizing Provider  atorvastatin (LIPITOR) 10 MG tablet Take 1 tablet (10 mg total) by mouth daily at 6 PM. 12/13/16   Wendie Agreste, MD  Cholecalciferol (VITAMIN D3) 5000 units TABS Take 1 tablet (5,000 Units total) by mouth daily at 12 noon. 03/07/17   Dennard Nip D, MD  hydrocortisone cream 1 % APP AA BID FOR 2 WEEKS 12/29/16   [provider]  losartan (COZAAR) 50 MG tablet Take 1 tablet (50 mg total) by mouth every morning. 12/13/16   Wendie Agreste, MD  metFORMIN (GLUCOPHAGE) 500 MG tablet Take 1 tablet (500 mg total) by mouth daily with breakfast. Patient taking differently: Take 500 mg by mouth 2 (two) times daily with a meal.  12/13/16   Wendie Agreste, MD  ranitidine (ZANTAC) 300 MG tablet TAKE 1 TABLET BY MOUTH AT  BEDTIME 04/08/17   Wendie Agreste, MD  sildenafil (VIAGRA) 100 MG tablet TAKE 1/2 TABLET BY MOUTH DAILY AS NEEDED FOR ERECTILE DYSFUNCTION. 05/29/17   Wendie Agreste, MD   Social History   Socioeconomic History  . Marital status: Married    Spouse name: [name]  . Number of children: 3  . Years of education: Not on file  . Highest education level: Not on file  Occupational History  . Occupation: Works in Location manager  . Financial resource strain: Not on file  . Food insecurity:    Worry: Not on file    Inability: Not on file  . Transportation needs:    Medical: Not on file    Non-medical: Not on file    Tobacco Use  . Smoking status: Never Smoker  . Smokeless tobacco: Never Used  Substance and Sexual Activity  . Alcohol use: No  . Drug use: No  . Sexual activity: Not on file  Lifestyle  . Physical activity:    Days per week: Not on file    Minutes per session: Not on file  . Stress: Not on file  Relationships  . Social  connections:    Talks on phone: Not on file    Gets together: Not on file    Attends religious service: Not on file    Active member of club or organization: Not on file    Attends meetings of clubs or organizations: Not on file    Relationship status: Not on file  . Intimate partner violence:    Fear of current or ex partner: Not on file    Emotionally abused: Not on file    Physically abused: Not on file    Forced sexual activity: Not on file  Other Topics Concern  . Not on file  Social History Narrative  . Not on file   Review of Systems 13 point ROS - negative     Objective:   Physical Exam  Constitutional: He is oriented to person, place, and time. He appears well-developed and well-nourished.  HENT:  Head: Normocephalic and atraumatic.  Right Ear: External ear normal.  Left Ear: External ear normal.  Mouth/Throat: Oropharynx is clear and moist.  Eyes: Pupils are equal, round, and reactive to light. Conjunctivae and EOM are normal.  Neck: Normal range of motion. Neck supple. No thyromegaly present.  Cardiovascular: Normal rate, regular rhythm, normal heart sounds and intact distal pulses.  Pulmonary/Chest: Effort normal and breath sounds normal. No respiratory distress. He has no wheezes.  Abdominal: Soft. He exhibits no distension. There is no tenderness. Hernia confirmed negative in the right inguinal area and confirmed negative in the left inguinal area.  Genitourinary: Prostate normal.  Musculoskeletal: Normal range of motion. He exhibits no edema or tenderness.  Lymphadenopathy:    He has no cervical adenopathy.  Neurological: He is alert  and oriented to person, place, and time. He has normal reflexes.  Skin: Skin is warm and dry.  Psychiatric: He has a normal mood and affect. His behavior is normal.  Vitals reviewed.    Vitals:   06/11/17 1518  BP: 136/76  Pulse: (!) 53  Resp: 17  Temp: 98.6 F (37 C)  TempSrc: Oral  SpO2: 98%  Weight: 227 lb (103 kg)  Height: 5\' 4"  (1.626 m)       Assessment & Plan:   Napoleon Monacelli is a 57 y.o. male Annual physical exam  - -anticipatory guidance as below in AVS, screening labs above. Health maintenance items as above in HPI discussed/recommended as applicable.   Diabetes mellitus without complication ([place]) - Plan: Hemoglobin A1c, metFORMIN (GLUCOPHAGE) 500 MG tablet Type 2 diabetes mellitus without complication, without long-term current use of insulin ([place]) - Plan: metFORMIN (GLUCOPHAGE) 500 MG tablet  -Previously controlled, tolerating metformin current dose.  Check A1c, continue same dose metformin  Gastroesophageal reflux disease, esophagitis presence not specified  -Controlled with Zantac, no change in dose.  Erectile dysfunction, unspecified erectile dysfunction type - Plan: sildenafil (VIAGRA) 100 MG tablet  -viagra Rx given - use lowest effective dose. Side effects discussed (including but not limited to headache/flushing, blue discoloration of vision, possible vascular steal and risk of cardiac effects if underlying unknown coronary artery disease, and permanent sensorineural hearing loss). Understanding expressed.  -Plan for testosterone testing at the next morning visit.  Screening for prostate cancer - Plan: PSA  -We discussed pros and cons of prostate cancer screening, and after this discussion, he chose to have screening done. PSA obtained, and no concerning findings on DRE.  Essential hypertension - Plan: losartan (COZAAR) 50 MG tablet  -Stable, tolerating current dose of losartan.  No changes.  Hyperlipidemia, unspecified hyperlipidemia type - Plan:  atorvastatin (LIPITOR) 10 MG tablet  -Tolerating Lipitor current dose.  No changes  Need for shingles vaccine - Plan: Zoster Vaccine Adjuvanted [facility]) injection sent to pharmacy   Meds ordered this encounter  Medications  . metFORMIN (GLUCOPHAGE) 500 MG tablet    Sig: Take 1 tablet (500 mg total) by mouth daily with breakfast.    Dispense:  90 tablet    Refill:  1  . sildenafil (VIAGRA) 100 MG tablet    Sig: TAKE 1/2 TABLET BY MOUTH DAILY AS NEEDED FOR ERECTILE DYSFUNCTION.    Dispense:  5 tablet    Refill:  3  . losartan (COZAAR) 50 MG tablet    Sig: Take 1 tablet (50 mg total) by mouth every morning.    Dispense:  90 tablet    Refill:  1  . atorvastatin (LIPITOR) 10 MG tablet    Sig: Take 1 tablet (10 mg total) by mouth daily at 6 PM.    Dispense:  90 tablet    Refill:  1  . Zoster Vaccine Adjuvanted [facility]) injection    Sig: Inject 0.5 mLs into the muscle once for 1 dose. Repeat in 2-6 months.    Dispense:  0.5 mL    Refill:  1   Patient Instructions    Thanks you for coming in today. No med changes for now. Can check testosterone at morning visit next time if you would like.   I sent the shingles vaccine to your pharmacy.   Keeping you healthy  Get these tests  Garner pressure- Have your Garner pressure checked once a year by your healthcare provider.  Normal Garner pressure is 120/80  Weight- Have your body mass index (BMI) calculated to screen for obesity.  BMI is a measure of body fat based on height and weight. You can also calculate your own BMI at ViewBanking.si.  Cholesterol- Have your cholesterol checked every year.  Diabetes- Have your Garner sugar checked regularly if you have high Garner pressure, high cholesterol, have a family history of diabetes or if you are overweight.  Screening for Colon Cancer- Colonoscopy starting at age 18.  Screening may begin sooner depending on your family history and other health conditions. Follow up  colonoscopy as directed by your Gastroenterologist.  Screening for Prostate Cancer- Both Garner work (PSA) and a rectal exam help screen for Prostate Cancer.  Screening begins at age 82 with African-American men and at age 14 with Caucasian men.  Screening may begin sooner depending on your family history.  Take these medicines  Aspirin- One aspirin daily can help prevent Heart disease and Stroke.  Flu shot- Every fall.  Tetanus- Every 10 years.  Zostavax- Once after the age of 55 to prevent Shingles.  Pneumonia shot- Once after the age of 41; if you are younger than 4, ask your healthcare provider if you need a Pneumonia shot.  Take these steps  Don't smoke- If you do smoke, talk to your doctor about quitting.  For tips on how to quit, go to www.smokefree.gov or call 1-800-QUIT-NOW.  Be physically active- Exercise 5 days a week for at least 30 minutes.  If you are not already physically active start slow and gradually work up to 30 minutes of moderate physical activity.  Examples of moderate activity include walking briskly, mowing the yard, dancing, swimming, bicycling, etc.  Eat a healthy diet- Eat a variety of healthy food such as fruits, vegetables, low fat milk, low fat  cheese, yogurt, lean meant, poultry, fish, beans, tofu, etc. For more information go to www.thenutritionsource.org  Drink alcohol in moderation- Limit alcohol intake to less than two drinks a day. Never drink and drive.  Dentist- Brush and floss twice daily; visit your dentist twice a year.  Depression- Your emotional health is as important as your physical health. If you're feeling down, or losing interest in things you would normally enjoy please talk to your healthcare provider.  Eye exam- Visit your eye doctor every year.  Safe sex- If you may be exposed to a sexually transmitted infection, use a condom.  Seat belts- Seat belts can save your life; always wear one.  Smoke/Carbon Monoxide detectors- These  detectors need to be installed on the appropriate level of your home.  Replace batteries at least once a year.  Skin cancer- When out in the sun, cover up and use sunscreen 15 SPF or higher.  Violence- If anyone is threatening you, please tell your healthcare provider.  Living Will/ Health care power of attorney- Speak with your healthcare provider and family.   IF you received an x-ray today, you will receive an invoice from [facility]. Please contact [facility] at [phone] with questions or concerns regarding your invoice.   IF you received labwork today, you will receive an invoice from [facility]. Please contact [facility] at [phone] with questions or concerns regarding your invoice.   Our billing staff will not be able to assist you with questions regarding bills from these companies.  You will be contacted with the lab results as soon as they are available. The fastest way to get your results is to activate your My Chart account. Instructions are located on the last page of this paperwork. If you have not heard from [name] regarding the results in 2 weeks, please contact this office.       I personally performed the services described in this documentation, which was scribed in my presence. The recorded information has been reviewed and considered for accuracy and completeness, addended by me as needed, and agree with information above.  Signed,   [name], MD [facility].  06/16/17 4:59 PM

## 2017-06-11 NOTE — Patient Instructions (Addendum)
Thanks you for coming in today. No med changes for now. Can check testosterone at morning visit next time if you would like.   I sent the shingles vaccine to your pharmacy.   Keeping you healthy  Get these tests  Blood pressure- Have your blood pressure checked once a year by your healthcare provider.  Normal blood pressure is 120/80  Weight- Have your body mass index (BMI) calculated to screen for obesity.  BMI is a measure of body fat based on height and weight. You can also calculate your own BMI at [email or web address].  Cholesterol- Have your cholesterol checked every year.  Diabetes- Have your blood sugar checked regularly if you have high blood pressure, high cholesterol, have a family history of diabetes or if you are overweight.  Screening for Colon Cancer- Colonoscopy starting at age 44.  Screening may begin sooner depending on your family history and other health conditions. Follow up colonoscopy as directed by your Gastroenterologist.  Screening for Prostate Cancer- Both blood work (PSA) and a rectal exam help screen for Prostate Cancer.  Screening begins at age 38 with African-American men and at age 18 with Caucasian men.  Screening may begin sooner depending on your family history.  Take these medicines  Aspirin- One aspirin daily can help prevent Heart disease and Stroke.  Flu shot- Every fall.  Tetanus- Every 10 years.  Zostavax- Once after the age of 61 to prevent Shingles.  Pneumonia shot- Once after the age of 63; if you are younger than 7, ask your healthcare provider if you need a Pneumonia shot.  Take these steps  Don't smoke- If you do smoke, talk to your doctor about quitting.  For tips on how to quit, go to www.smokefree.gov or call 1-800-QUIT-NOW.  Be physically active- Exercise 5 days a week for at least 30 minutes.  If you are not already physically active start slow and gradually work up to 30 minutes of moderate physical activity.  Examples of  moderate activity include walking briskly, mowing the yard, dancing, swimming, bicycling, etc.  Eat a healthy diet- Eat a variety of healthy food such as fruits, vegetables, low fat milk, low fat cheese, yogurt, lean meant, poultry, fish, beans, tofu, etc. For more information go to www.thenutritionsource.org  Drink alcohol in moderation- Limit alcohol intake to less than two drinks a day. Never drink and drive.  Dentist- Brush and floss twice daily; visit your dentist twice a year.  Depression- Your emotional health is as important as your physical health. If you're feeling down, or losing interest in things you would normally enjoy please talk to your healthcare provider.  Eye exam- Visit your eye doctor every year.  Safe sex- If you may be exposed to a sexually transmitted infection, use a condom.  Seat belts- Seat belts can save your life; always wear one.  Smoke/Carbon Monoxide detectors- These detectors need to be installed on the appropriate level of your home.  Replace batteries at least once a year.  Skin cancer- When out in the sun, cover up and use sunscreen 15 SPF or higher.  Violence- If anyone is threatening you, please tell your healthcare provider.  Living Will/ Health care power of attorney- Speak with your healthcare provider and family.   IF you received an x-ray today, you will receive an invoice from [facility]. Please contact [facility] at [phone] with questions or concerns regarding your invoice.   IF you received labwork today, you will receive an invoice from [place]. Please  contact [facility] at [phone] with questions or concerns regarding your invoice.   Our billing staff will not be able to assist you with questions regarding bills from these companies.  You will be contacted with the lab results as soon as they are available. The fastest way to get your results is to activate your My Chart account. Instructions are located on  the last page of this paperwork. If you have not heard from [name] regarding the results in 2 weeks, please contact this office.

## 2017-06-12 LAB — HEMOGLOBIN A1C
Est. average glucose Bld gHb Est-mCnc: 128 mg/dL
HEMOGLOBIN A1C: 6.1 % — AB (ref 4.8–5.6)

## 2017-06-12 LAB — PSA: PROSTATE SPECIFIC AG, SERUM: 1.3 ng/mL (ref 0.0–4.0)

## 2017-06-17 ENCOUNTER — Telehealth: Payer: Self-pay | Admitting: Family Medicine

## 2017-06-17 ENCOUNTER — Ambulatory Visit (INDEPENDENT_AMBULATORY_CARE_PROVIDER_SITE_OTHER): Payer: 59 | Admitting: Physician Assistant

## 2017-06-17 VITALS — BP 125/74 | HR 57 | Temp 98.3°F | Ht 64.0 in | Wt 221.0 lb

## 2017-06-17 DIAGNOSIS — R42 Dizziness and giddiness: Secondary | ICD-10-CM | POA: Diagnosis not present

## 2017-06-17 DIAGNOSIS — I10 Essential (primary) hypertension: Secondary | ICD-10-CM | POA: Diagnosis not present

## 2017-06-17 DIAGNOSIS — Z6838 Body mass index (BMI) 38.0-38.9, adult: Secondary | ICD-10-CM | POA: Diagnosis not present

## 2017-06-17 NOTE — Progress Notes (Signed)
Office: [phone]  /  Fax: [phone]   HPI:   Chief Complaint: OBESITY Matthew Garner is here to discuss his progress with his obesity treatment plan. He is on the Category 2 plan and is following his eating plan approximately 60 % of the time. He states he is doing cardio for 10-15 minutes 2 times per week. Matthew Garner continues to do well with weight loss. He incorporated more lean protein and states hunger is well controlled. He has not been drinking enough water.  His weight is 221 lb (100.2 kg) today and has had a weight loss of 4 pounds over a period of 3 to 4 weeks since his last visit. He has lost 26 lbs since starting treatment with [name].  Hypertension Matthew Garner is a 57 y.o. male with hypertension. Matthew Garner's blood pressure is stable and he denies chest pain or shortness of breath. He is working weight loss to help control his blood pressure with the goal of decreasing his risk of heart attack and stroke. Matthew Garner's blood pressure is currently controlled.  Dizziness Pt complains of occasional lightheadedness, most recent few days ago, occurred when he changed positions from standing to sitting. Patient's  HR <60 on multiple visits in the office. Multiple past EKGs with prolonged PR intervals. Patient admits he does not drink much water even with increase in his exercise. Denies any recent illness, other associated symptoms, LOC, dyspnea, cp.     ALLERGIES: No Known Allergies  MEDICATIONS: Current Outpatient Medications on File Prior to Visit  Medication Sig Dispense Refill  . atorvastatin (LIPITOR) 10 MG tablet Take 1 tablet (10 mg total) by mouth daily at 6 PM. 90 tablet 1  . Cholecalciferol (VITAMIN D3) 5000 units TABS Take 1 tablet (5,000 Units total) by mouth daily at 12 noon. 30 tablet   . hydrocortisone cream 1 % APP AA BID FOR 2 WEEKS  0  . losartan (COZAAR) 50 MG tablet Take 1 tablet (50 mg total) by mouth every morning. 90 tablet 1  . metFORMIN (GLUCOPHAGE) 500 MG tablet Take 1 tablet  (500 mg total) by mouth daily with breakfast. 90 tablet 1  . ranitidine (ZANTAC) 300 MG tablet TAKE 1 TABLET BY MOUTH AT  BEDTIME 90 tablet 0  . sildenafil (VIAGRA) 100 MG tablet TAKE 1/2 TABLET BY MOUTH DAILY AS NEEDED FOR ERECTILE DYSFUNCTION. 5 tablet 3   Current Facility-Administered Medications on File Prior to Visit  Medication Dose Route Frequency Provider Last Rate Last Dose  . 0.9 %  sodium chloride infusion  500 mL Intravenous Once Ladene Artist, MD        PAST MEDICAL HISTORY: Past Medical History:  Diagnosis Date  . Allergy    environmental  . Anemia   . Back pain   . Diabetes mellitus   . Environmental allergies 11/27/2011  . GERD (gastroesophageal reflux disease)   . Hyperlipidemia   . Hypertension   . Joint pain   . Knee pain, bilateral   . Sebaceous cyst 11/10/2012  . Snores     PAST SURGICAL HISTORY: Past Surgical History:  Procedure Laterality Date  . APPENDECTOMY    . KNEE SURGERY     right  . MASS EXCISION Bilateral 11/24/2012   Procedure: EXCISION SCALP MASS X2;  Surgeon: [name], MD;  Location: [facility];  Service: General;  Laterality: Bilateral;  . SPINE SURGERY  2004   lumb  . UPPER GASTROINTESTINAL ENDOSCOPY      SOCIAL HISTORY: Social History  Tobacco Use  . Smoking status: Never Smoker  . Smokeless tobacco: Never Used  Substance Use Topics  . Alcohol use: No  . Drug use: No    FAMILY HISTORY: Family History  Problem Relation Age of Onset  . Colon cancer Neg Hx   . Colon polyps Neg Hx     ROS: Review of Systems  Constitutional: Positive for weight loss.  Respiratory: Negative for shortness of breath.   Cardiovascular: Negative for chest pain.  Neurological: Positive for dizziness.    PHYSICAL EXAM: Blood pressure 125/74, pulse (!) 57, temperature 98.3 F (36.8 C), temperature source Oral, height 5\' 4"  (1.626 m), weight 221 lb (100.2 kg), SpO2 97 %. Body mass index is 37.93 kg/m. Physical  Exam  Constitutional: He is oriented to person, place, and time. He appears well-developed and well-nourished.  Cardiovascular: Bradycardia present.  Pulmonary/Chest: Effort normal.  Musculoskeletal: Normal range of motion.  Neurological: He is oriented to person, place, and time.  Skin: Skin is warm and dry.  Psychiatric: He has a normal mood and affect. His behavior is normal.  Vitals reviewed.   RECENT LABS AND TESTS: BMET    Component Value Date/Time   NA 139 02/04/2017 1035   K 4.6 02/04/2017 1035   CL 97 02/04/2017 1035   CO2 25 02/04/2017 1035   GLUCOSE 113 (H) 02/04/2017 1035   GLUCOSE 130 (H) 08/20/2016 0926   BUN 12 02/04/2017 1035   CREATININE 0.79 02/04/2017 1035   CREATININE 0.76 09/15/2015 1525   CALCIUM 9.7 02/04/2017 1035   GFRNONAA 102 02/04/2017 1035   GFRNONAA >89 09/15/2015 1525   GFRAA 118 02/04/2017 1035   GFRAA >89 09/15/2015 1525   Lab Results  Component Value Date   HGBA1C 6.1 (H) 06/11/2017   HGBA1C 6.8 (H) 02/04/2017   HGBA1C 6.8 (H) 12/13/2016   HGBA1C 7.1 (H) 09/13/2016   HGBA1C 7.0 (H) 06/07/2016   Lab Results  Component Value Date   INSULIN 15.5 02/04/2017   CBC    Component Value Date/Time   WBC 6.2 02/04/2017 1035   WBC 7.8 08/20/2016 0926   RBC 5.27 02/04/2017 1035   RBC 5.13 08/20/2016 0926   HGB 16.0 02/04/2017 1035   HCT 46.1 02/04/2017 1035   PLT 279 08/20/2016 0926   MCV 88 02/04/2017 1035   MCH 30.4 02/04/2017 1035   MCH 30.0 08/20/2016 0926   MCHC 34.7 02/04/2017 1035   MCHC 35.5 08/20/2016 0926   RDW 13.4 02/04/2017 1035   LYMPHSABS 1.5 02/04/2017 1035   MONOABS 0.7 08/20/2016 0926   EOSABS 0.1 02/04/2017 1035   BASOSABS 0.1 02/04/2017 1035   Iron/TIBC/Ferritin/ %Sat No results found for: IRON, TIBC, FERRITIN, IRONPCTSAT Lipid Panel     Component Value Date/Time   CHOL 145 02/04/2017 1035   TRIG 77 02/04/2017 1035   HDL 61 02/04/2017 1035   CHOLHDL 2.6 12/13/2016 1632   CHOLHDL 3.0 09/15/2015 1525    VLDL 13 09/15/2015 1525   LDLCALC 69 02/04/2017 1035   Hepatic Function Panel     Component Value Date/Time   PROT 7.3 02/04/2017 1035   ALBUMIN 4.6 02/04/2017 1035   AST 17 02/04/2017 1035   ALT 21 02/04/2017 1035   ALKPHOS 84 02/04/2017 1035   BILITOT 0.4 02/04/2017 1035      Component Value Date/Time   TSH 1.620 02/04/2017 1035    ASSESSMENT AND PLAN: Essential hypertension  Dizziness  Class 2 severe obesity with serious comorbidity and body mass index (BMI)  of 38.0 to 38.9 in adult, unspecified obesity type ([place])  PLAN:  Hypertension We discussed sodium restriction, working on healthy weight loss, and a regular exercise program as the means to achieve improved blood pressure control. Matthew Garner agreed with this plan and agreed to follow up as directed. We will continue to monitor his blood pressure as well as his progress with the above lifestyle modifications. He will continue his medications as prescribed and will watch for signs of hypotension as he continues his lifestyle modifications. Matthew Garner agrees to follow up with our clinic in 4 weeks.  Dizziness Since dehydration could be cause of symptoms, pt is encouraged to incnrease his water intake. Also, he is instructed to follow up with his PCP for any further management of his lightheadedness. If symptoms worsen or he develops any new symptoms, he is to go to an urgent care or the ED   We spent > than 50% of the 15 minute visit on the counseling as documented in the note.  Obesity Matthew Garner is currently in the action stage of change. As such, his goal is to continue with weight loss efforts He has agreed to follow the Category 2 plan Matthew Garner has been instructed to work up to a goal of 150 minutes of combined cardio and strengthening exercise per week for weight loss and overall health benefits. We discussed the following Behavioral Modification Strategies today: increasing lean protein intake and increase H20 intake   Matthew Garner has  agreed to follow up with our clinic in 4 weeks. He was informed of the importance of frequent follow up visits to maximize his success with intensive lifestyle modifications for his multiple health conditions.   OBESITY BEHAVIORAL INTERVENTION VISIT  Today's visit was # 9 out of 22.  Starting weight: 247 lbs Starting date: 02/04/17 Today's weight : 221 lbs Today's date: 06/18/2017 Total lbs lost to date: 31 (Patients must lose 7 lbs in the first 6 months to continue with counseling)   ASK: We discussed the diagnosis of obesity with Matthew Garner today and Matthew Garner agreed to give [name] permission to discuss obesity behavioral modification therapy today.  ASSESS: Matthew Garner has the diagnosis of obesity and his BMI today is 37.92 Matthew Garner is in the action stage of change   ADVISE: Emmert was educated on the multiple health risks of obesity as well as the benefit of weight loss to improve his health. He was advised of the need for long term treatment and the importance of lifestyle modifications.  AGREE: Multiple dietary modification options and treatment options were discussed and  Matthew Garner agreed to the above obesity treatment plan.   Wilhemena Durie, am acting as transcriptionist for Lacy Duverney, PA-C I, Lacy Duverney [facility], have reviewed this note and agree with its content

## 2017-06-17 NOTE — Telephone Encounter (Signed)
Pt calling in wanting to know his lab results he is stating they are showing on his my chart and he would like sent to there also

## 2017-06-18 NOTE — Telephone Encounter (Signed)
labs 06/11/17 not released to MyChart - and no notes found regarding patient notification- he would like a call.

## 2017-06-23 ENCOUNTER — Other Ambulatory Visit: Payer: Self-pay | Admitting: Family Medicine

## 2017-06-23 DIAGNOSIS — E785 Hyperlipidemia, unspecified: Secondary | ICD-10-CM

## 2017-06-23 DIAGNOSIS — E119 Type 2 diabetes mellitus without complications: Secondary | ICD-10-CM

## 2017-06-23 DIAGNOSIS — I10 Essential (primary) hypertension: Secondary | ICD-10-CM

## 2017-06-26 ENCOUNTER — Other Ambulatory Visit: Payer: Self-pay | Admitting: *Deleted

## 2017-06-26 ENCOUNTER — Telehealth: Payer: Self-pay | Admitting: *Deleted

## 2017-06-26 DIAGNOSIS — E119 Type 2 diabetes mellitus without complications: Secondary | ICD-10-CM

## 2017-06-26 DIAGNOSIS — N529 Male erectile dysfunction, unspecified: Secondary | ICD-10-CM

## 2017-06-26 DIAGNOSIS — I10 Essential (primary) hypertension: Secondary | ICD-10-CM

## 2017-06-26 DIAGNOSIS — E785 Hyperlipidemia, unspecified: Secondary | ICD-10-CM

## 2017-06-26 MED ORDER — METFORMIN HCL 500 MG PO TABS: 500.0000 mg | ORAL_TABLET | Freq: Every day | ORAL | 1 refills | Status: DC

## 2017-06-26 MED ORDER — ATORVASTATIN CALCIUM 10 MG PO TABS: 10.0000 mg | ORAL_TABLET | Freq: Every day | ORAL | 1 refills | Status: DC

## 2017-06-26 MED ORDER — SILDENAFIL CITRATE 100 MG PO TABS: ORAL_TABLET | ORAL | 3 refills | Status: DC

## 2017-06-26 MED ORDER — LOSARTAN POTASSIUM 50 MG PO TABS: 50.0000 mg | ORAL_TABLET | Freq: Every morning | ORAL | 1 refills | Status: DC

## 2017-06-26 NOTE — Telephone Encounter (Signed)
Cancelled prescriptions at [facility] and resent to [facility]

## 2017-07-15 ENCOUNTER — Ambulatory Visit (INDEPENDENT_AMBULATORY_CARE_PROVIDER_SITE_OTHER): Payer: 59 | Admitting: Physician Assistant

## 2017-07-15 ENCOUNTER — Encounter (INDEPENDENT_AMBULATORY_CARE_PROVIDER_SITE_OTHER): Payer: Self-pay | Admitting: Physician Assistant

## 2017-07-15 VITALS — BP 128/76 | HR 61 | Temp 98.5°F | Ht 64.0 in | Wt 227.0 lb

## 2017-07-15 DIAGNOSIS — I10 Essential (primary) hypertension: Secondary | ICD-10-CM | POA: Diagnosis not present

## 2017-07-15 DIAGNOSIS — Z6839 Body mass index (BMI) 39.0-39.9, adult: Secondary | ICD-10-CM | POA: Diagnosis not present

## 2017-07-15 NOTE — Progress Notes (Signed)
Office: [phone]  /  Fax: [phone]   HPI:   Chief Complaint: OBESITY Matthew Garner is here to discuss his progress with his obesity treatment plan. He is on the Category 2 plan and is following his eating plan approximately 70 % of the time. He states he is walking and biking for 30 minutes 2 times per week. Matthew Garner has been off track with his eating and is not preparing his meals as well. He will be traveling out of the country and requests a longer follow up time. His weight is 227 lb (103 kg) today and has had a weight gain of 6 pounds over a period of 4 weeks since his last visit. He has lost 20 lbs since starting treatment with [name].  Hypertension Matthew Garner is a 57 y.o. male with hypertension. Matthew Garner denies chest pain or shortness of breath on exertion. He is working weight loss to help control his blood pressure with the goal of decreasing his risk of heart attack and stroke. Matthew Garner blood pressure is stable.  ALLERGIES: No Known Allergies  MEDICATIONS: Current Outpatient Medications on File Prior to Visit  Medication Sig Dispense Refill  . atorvastatin (LIPITOR) 10 MG tablet Take 1 tablet (10 mg total) by mouth daily at 6 PM. 90 tablet 1  . Cholecalciferol (VITAMIN D3) 5000 units TABS Take 1 tablet (5,000 Units total) by mouth daily at 12 noon. 30 tablet   . hydrocortisone cream 1 % APP AA BID FOR 2 WEEKS  0  . losartan (COZAAR) 50 MG tablet Take 1 tablet (50 mg total) by mouth every morning. 90 tablet 1  . metFORMIN (GLUCOPHAGE) 500 MG tablet Take 1 tablet (500 mg total) by mouth daily with breakfast. 90 tablet 1  . ranitidine (ZANTAC) 300 MG tablet TAKE 1 TABLET BY MOUTH AT  BEDTIME 90 tablet 0  . sildenafil (VIAGRA) 100 MG tablet TAKE 1/2 TABLET BY MOUTH DAILY AS NEEDED FOR ERECTILE DYSFUNCTION. 5 tablet 3   Current Facility-Administered Medications on File Prior to Visit  Medication Dose Route Frequency Provider Last Rate Last Dose  . 0.9 %  sodium chloride infusion  500  mL Intravenous Once Matthew Artist, MD        PAST MEDICAL HISTORY: Past Medical History:  Diagnosis Date  . Allergy    environmental  . Anemia   . Back pain   . Diabetes mellitus   . Environmental allergies 11/27/2011  . GERD (gastroesophageal reflux disease)   . Hyperlipidemia   . Hypertension   . Joint pain   . Knee pain, bilateral   . Sebaceous cyst 11/10/2012  . Snores     PAST SURGICAL HISTORY: Past Surgical History:  Procedure Laterality Date  . APPENDECTOMY    . KNEE SURGERY     right  . MASS EXCISION Bilateral 11/24/2012   Procedure: EXCISION SCALP MASS X2;  Surgeon: [name], MD;  Location: [place];  Service: General;  Laterality: Bilateral;  . SPINE SURGERY  2004   lumb  . UPPER GASTROINTESTINAL ENDOSCOPY      SOCIAL HISTORY: Social History   Tobacco Use  . Smoking status: Never Smoker  . Smokeless tobacco: Never Used  Substance Use Topics  . Alcohol use: No  . Drug use: No    FAMILY HISTORY: Family History  Problem Relation Age of Onset  . Colon cancer Neg Hx   . Colon polyps Neg Hx     ROS: Review of Systems  Constitutional:  Negative for weight loss.  Respiratory: Negative for shortness of breath (on exertion).   Cardiovascular: Negative for chest pain.    PHYSICAL EXAM: Blood pressure 128/76, pulse 61, temperature 98.5 F (36.9 C), temperature source Oral, height 5\' 4"  (1.626 m), weight 227 lb (103 kg), SpO2 98 %. Body mass index is 38.96 kg/m. Physical Exam  Constitutional: He is oriented to person, place, and time. He appears well-developed and well-nourished.  Cardiovascular: Normal rate.  Pulmonary/Chest: Effort normal.  Musculoskeletal: Normal range of motion.  Neurological: He is oriented to person, place, and time.  Skin: Skin is warm and dry.  Psychiatric: He has a normal mood and affect. His behavior is normal.  Vitals reviewed.   RECENT LABS AND TESTS: BMET    Component Value Date/Time     NA 139 02/04/2017 1035   K 4.6 02/04/2017 1035   CL 97 02/04/2017 1035   CO2 25 02/04/2017 1035   GLUCOSE 113 (H) 02/04/2017 1035   GLUCOSE 130 (H) 08/20/2016 0926   BUN 12 02/04/2017 1035   CREATININE 0.79 02/04/2017 1035   CREATININE 0.76 09/15/2015 1525   CALCIUM 9.7 02/04/2017 1035   GFRNONAA 102 02/04/2017 1035   GFRNONAA >89 09/15/2015 1525   GFRAA 118 02/04/2017 1035   GFRAA >89 09/15/2015 1525   Lab Results  Component Value Date   HGBA1C 6.1 (H) 06/11/2017   HGBA1C 6.8 (H) 02/04/2017   HGBA1C 6.8 (H) 12/13/2016   HGBA1C 7.1 (H) 09/13/2016   HGBA1C 7.0 (H) 06/07/2016   Lab Results  Component Value Date   INSULIN 15.5 02/04/2017   CBC    Component Value Date/Time   WBC 6.2 02/04/2017 1035   WBC 7.8 08/20/2016 0926   RBC 5.27 02/04/2017 1035   RBC 5.13 08/20/2016 0926   HGB 16.0 02/04/2017 1035   HCT 46.1 02/04/2017 1035   PLT 279 08/20/2016 0926   MCV 88 02/04/2017 1035   MCH 30.4 02/04/2017 1035   MCH 30.0 08/20/2016 0926   MCHC 34.7 02/04/2017 1035   MCHC 35.5 08/20/2016 0926   RDW 13.4 02/04/2017 1035   LYMPHSABS 1.5 02/04/2017 1035   MONOABS 0.7 08/20/2016 0926   EOSABS 0.1 02/04/2017 1035   BASOSABS 0.1 02/04/2017 1035   Iron/TIBC/Ferritin/ %Sat No results found for: IRON, TIBC, FERRITIN, IRONPCTSAT Lipid Panel     Component Value Date/Time   CHOL 145 02/04/2017 1035   TRIG 77 02/04/2017 1035   HDL 61 02/04/2017 1035   CHOLHDL 2.6 12/13/2016 1632   CHOLHDL 3.0 09/15/2015 1525   VLDL 13 09/15/2015 1525   LDLCALC 69 02/04/2017 1035   Hepatic Function Panel     Component Value Date/Time   PROT 7.3 02/04/2017 1035   ALBUMIN 4.6 02/04/2017 1035   AST 17 02/04/2017 1035   ALT 21 02/04/2017 1035   ALKPHOS 84 02/04/2017 1035   BILITOT 0.4 02/04/2017 1035      Component Value Date/Time   TSH 1.620 02/04/2017 1035   Results for ANNA, LIVERS (MRN [id number]) as of 07/15/2017 16:32  Ref. Range 02/04/2017 10:35  Vitamin D, 25-Hydroxy Latest Ref  Range: 30.0 - 100.0 ng/mL 14.2 (L)   ASSESSMENT AND PLAN: Essential hypertension  Class 2 severe obesity with serious comorbidity and body mass index (BMI) of 39.0 to 39.9 in adult, unspecified obesity type ([place])  PLAN:  Hypertension We discussed sodium restriction, working on healthy weight loss, and a regular exercise program as the means to achieve improved blood pressure control. Larrell agreed with  this plan and agreed to follow up as directed. We will continue to monitor his blood pressure as well as his progress with the above lifestyle modifications. He will continue his medications as prescribed and will watch for signs of hypotension as he continues his lifestyle modifications.  We spent > than 50% of the 15 minute visit on the counseling as documented in the note.  Obesity Matthew Garner is currently in the action stage of change. As such, his goal is to continue with weight loss efforts He has agreed to portion control better and make smarter food choices, such as increase vegetables and decrease simple carbohydrates  Aydrien has been instructed to work up to a goal of 150 minutes of combined cardio and strengthening exercise per week for weight loss and overall health benefits. We discussed the following Behavioral Modification Strategies today: increasing lean protein intake and work on meal planning and easy cooking plans  Phong has agreed to follow up with our clinic in 8 weeks. He was informed of the importance of frequent follow up visits to maximize his success with intensive lifestyle modifications for his multiple health conditions.   OBESITY BEHAVIORAL INTERVENTION VISIT  Today's visit was # 10 out of 22.  Starting weight: 247 lbs Starting date: 02/04/17 Today's weight : 227 lbs  Today's date: 07/15/2017 Total lbs lost to date: 20 (Patients must lose 7 lbs in the first 6 months to continue with counseling)   ASK: We discussed the diagnosis of obesity with Wallace Keller today  and Dagen agreed to give [name] permission to discuss obesity behavioral modification therapy today.  ASSESS: Taniela has the diagnosis of obesity and his BMI today is 38.95 Jasmond is in the action stage of change   ADVISE: Kimothy was educated on the multiple health risks of obesity as well as the benefit of weight loss to improve his health. He was advised of the need for long term treatment and the importance of lifestyle modifications.  AGREE: Multiple dietary modification options and treatment options were discussed and  Damaris agreed to the above obesity treatment plan.   Corey Skains, am acting as transcriptionist for [employer], PA-C I, Lacy Duverney [facility], have reviewed this note and agree with its content

## 2017-08-02 ENCOUNTER — Other Ambulatory Visit: Payer: Self-pay | Admitting: Family Medicine

## 2017-08-02 DIAGNOSIS — K219 Gastro-esophageal reflux disease without esophagitis: Secondary | ICD-10-CM

## 2017-08-27 DIAGNOSIS — M170 Bilateral primary osteoarthritis of knee: Secondary | ICD-10-CM | POA: Diagnosis not present

## 2017-08-27 DIAGNOSIS — Z79899 Other long term (current) drug therapy: Secondary | ICD-10-CM | POA: Diagnosis not present

## 2017-09-02 ENCOUNTER — Emergency Department (HOSPITAL_COMMUNITY): Payer: 59

## 2017-09-02 ENCOUNTER — Emergency Department (HOSPITAL_COMMUNITY)
Admission: EM | Admit: 2017-09-02 | Discharge: 2017-09-02 | Disposition: A | Discharge status: Home / Self Care | Payer: 59 | Attending: Emergency Medicine | Admitting: Emergency Medicine

## 2017-09-02 ENCOUNTER — Other Ambulatory Visit: Payer: Self-pay

## 2017-09-02 ENCOUNTER — Encounter (HOSPITAL_COMMUNITY): Payer: Self-pay | Admitting: Student

## 2017-09-02 DIAGNOSIS — K2970 Gastritis, unspecified, without bleeding: Secondary | ICD-10-CM | POA: Diagnosis not present

## 2017-09-02 DIAGNOSIS — E119 Type 2 diabetes mellitus without complications: Secondary | ICD-10-CM | POA: Insufficient documentation

## 2017-09-02 DIAGNOSIS — R1084 Generalized abdominal pain: Secondary | ICD-10-CM | POA: Diagnosis not present

## 2017-09-02 DIAGNOSIS — R1011 Right upper quadrant pain: Secondary | ICD-10-CM | POA: Diagnosis not present

## 2017-09-02 DIAGNOSIS — I10 Essential (primary) hypertension: Secondary | ICD-10-CM | POA: Insufficient documentation

## 2017-09-02 DIAGNOSIS — R52 Pain, unspecified: Secondary | ICD-10-CM | POA: Diagnosis not present

## 2017-09-02 DIAGNOSIS — R1013 Epigastric pain: Secondary | ICD-10-CM | POA: Insufficient documentation

## 2017-09-02 DIAGNOSIS — Z79899 Other long term (current) drug therapy: Secondary | ICD-10-CM | POA: Diagnosis not present

## 2017-09-02 DIAGNOSIS — Z7984 Long term (current) use of oral hypoglycemic drugs: Secondary | ICD-10-CM | POA: Insufficient documentation

## 2017-09-02 DIAGNOSIS — R112 Nausea with vomiting, unspecified: Secondary | ICD-10-CM | POA: Diagnosis not present

## 2017-09-02 DIAGNOSIS — R109 Unspecified abdominal pain: Secondary | ICD-10-CM | POA: Diagnosis not present

## 2017-09-02 DIAGNOSIS — R61 Generalized hyperhidrosis: Secondary | ICD-10-CM | POA: Diagnosis not present

## 2017-09-02 LAB — COMPREHENSIVE METABOLIC PANEL
ALT: 20 U/L (ref 0–44)
ANION GAP: 19 — AB (ref 5–15)
AST: 31 U/L (ref 15–41)
Albumin: 4.3 g/dL (ref 3.5–5.0)
Alkaline Phosphatase: 79 U/L (ref 38–126)
BUN: 22 mg/dL — ABNORMAL HIGH (ref 6–20)
CO2: 21 mmol/L — AB (ref 22–32)
CREATININE: 1.02 mg/dL (ref 0.61–1.24)
Calcium: 9.5 mg/dL (ref 8.9–10.3)
Chloride: 100 mmol/L (ref 98–111)
Glucose, Bld: 179 mg/dL — ABNORMAL HIGH (ref 70–99)
Potassium: 3.7 mmol/L (ref 3.5–5.1)
SODIUM: 140 mmol/L (ref 135–145)
Total Bilirubin: 0.8 mg/dL (ref 0.3–1.2)
Total Protein: 7.7 g/dL (ref 6.5–8.1)

## 2017-09-02 LAB — URINALYSIS, ROUTINE W REFLEX MICROSCOPIC
BILIRUBIN URINE: NEGATIVE
GLUCOSE, UA: NEGATIVE mg/dL
HGB URINE DIPSTICK: NEGATIVE
KETONES UR: 20 mg/dL — AB
LEUKOCYTES UA: NEGATIVE
NITRITE: NEGATIVE
PH: 5 (ref 5.0–8.0)
PROTEIN: 30 mg/dL — AB
Specific Gravity, Urine: 1.029 (ref 1.005–1.030)

## 2017-09-02 LAB — I-STAT TROPONIN, ED
TROPONIN I, POC: 0 ng/mL (ref 0.00–0.08)
Troponin i, poc: 0.01 ng/mL (ref 0.00–0.08)

## 2017-09-02 LAB — CBC
HCT: 45.4 % (ref 39.0–52.0)
HEMOGLOBIN: 15.8 g/dL (ref 13.0–17.0)
MCH: 30.1 pg (ref 26.0–34.0)
MCHC: 34.8 g/dL (ref 30.0–36.0)
MCV: 86.5 fL (ref 78.0–100.0)
PLATELETS: 323 10*3/uL (ref 150–400)
RBC: 5.25 MIL/uL (ref 4.22–5.81)
RDW: 13.2 % (ref 11.5–15.5)
WBC: 10.8 10*3/uL — AB (ref 4.0–10.5)

## 2017-09-02 LAB — I-STAT CG4 LACTIC ACID, ED
LACTIC ACID, VENOUS: 2.96 mmol/L — AB (ref 0.5–1.9)
Lactic Acid, Venous: 1.61 mmol/L (ref 0.5–1.9)

## 2017-09-02 LAB — LIPASE, BLOOD: Lipase: 31 U/L (ref 11–51)

## 2017-09-02 MED ORDER — IOPAMIDOL (ISOVUE-300) INJECTION 61%
100.0000 mL | Freq: Once | INTRAVENOUS | Status: AC | PRN
  Administered 2017-09-02: 100 mL via INTRAVENOUS

## 2017-09-02 MED ORDER — GI COCKTAIL ~~LOC~~
30.0000 mL | Freq: Once | ORAL | Status: AC
  Administered 2017-09-02: 30 mL via ORAL
  Filled 2017-09-02: qty 30

## 2017-09-02 MED ORDER — MORPHINE SULFATE (PF) 4 MG/ML IV SOLN
4.0000 mg | Freq: Once | INTRAVENOUS | Status: DC
  Filled 2017-09-02: qty 1

## 2017-09-02 MED ORDER — FAMOTIDINE IN NACL 20-0.9 MG/50ML-% IV SOLN
20.0000 mg | Freq: Once | INTRAVENOUS | Status: AC
  Administered 2017-09-02: 20 mg via INTRAVENOUS
  Filled 2017-09-02: qty 50

## 2017-09-02 MED ORDER — SODIUM CHLORIDE 0.9 % IV BOLUS
1000.0000 mL | Freq: Once | INTRAVENOUS | Status: AC
  Administered 2017-09-02: 1000 mL via INTRAVENOUS

## 2017-09-02 MED ORDER — OMEPRAZOLE 20 MG PO CPDR: 20.0000 mg | DELAYED_RELEASE_CAPSULE | Freq: Every day | ORAL | 0 refills | Status: DC

## 2017-09-02 MED ORDER — ONDANSETRON HCL 4 MG/2ML IJ SOLN
4.0000 mg | Freq: Once | INTRAMUSCULAR | Status: AC | PRN
  Administered 2017-09-02: 4 mg via INTRAVENOUS
  Filled 2017-09-02: qty 2

## 2017-09-02 NOTE — ED Notes (Signed)
ED Provider at bedside. 

## 2017-09-02 NOTE — ED Notes (Signed)
US at bedside

## 2017-09-02 NOTE — ED Provider Notes (Signed)
[facility] DEPT Provider Note   CSN: [id number] Arrival date & time: 09/02/17  1607     History   Chief Complaint Chief Complaint  Patient presents with  . Abdominal Pain  . Emesis    HPI Matthew Garner is a 57 y.o. male with a history of  Type 2 diabetes mellitus, hypertension, hyperlipidemia, GERD, and prior appendectomy who arrives to the ED via EMS with complaints of epigastric pain which started approximately 30 minutes prior to arrival.  Patient states that he was at a barbecue cookout, he recently had some meat, and subsequently developed abdominal discomfort.  Pain is located in the epigastrium, constant, severe in nature, does not radiate to the back or to the chest.  No specific alleviating or aggravating factors.  He did receive 150 mcg of fentanyl in route by EMS without significant change.  Upon arrival to the emergency department he has had 2 episodes of emesis and does feel nauseous at present.  He states that he did become somewhat sweaty with the pain with some chills.  Denies overt fever, diarrhea, blood in stool, chest pain, shortness of breath, or urinary symptoms.  HPI  Past Medical History:  Diagnosis Date  . Allergy    environmental  . Anemia   . Back pain   . Diabetes mellitus   . Environmental allergies 11/27/2011  . GERD (gastroesophageal reflux disease)   . Hyperlipidemia   . Hypertension   . Joint pain   . Knee pain, bilateral   . Sebaceous cyst 11/10/2012  . Snores     Patient Active Problem List   Diagnosis Date Noted  . Type 2 diabetes mellitus without complication, without long-term current use of insulin ([place]) 03/04/2017  . Vitamin D deficiency 03/04/2017  . Other fatigue 02/04/2017  . Shortness of breath on exertion 02/04/2017  . Knee pain, bilateral 03/29/2015  . Nonspecific abnormal electrocardiogram (ECG) (EKG) 04/21/2014  . Spinal stenosis of lumbar region 10/27/2013  . Pain of left calf 06/16/2013  .  Diabetes ([place]) 11/27/2011  . Hyperlipidemia 11/27/2011  . Hypertension 11/27/2011    Past Surgical History:  Procedure Laterality Date  . APPENDECTOMY    . KNEE SURGERY     right  . MASS EXCISION Bilateral 11/24/2012   Procedure: EXCISION SCALP MASS X2;  Surgeon: [name], MD;  Location: [facility];  Service: General;  Laterality: Bilateral;  . SPINE SURGERY  2004   lumb  . UPPER GASTROINTESTINAL ENDOSCOPY          Home Medications    Prior to Admission medications   Medication Sig Start Date End Date Taking? Authorizing Provider  atorvastatin (LIPITOR) 10 MG tablet Take 1 tablet (10 mg total) by mouth daily at 6 PM. 06/26/17   Wendie Agreste, MD  Cholecalciferol (VITAMIN D3) 5000 units TABS Take 1 tablet (5,000 Units total) by mouth daily at 12 noon. 03/07/17   Dennard Nip D, MD  hydrocortisone cream 1 % APP AA BID FOR 2 WEEKS 12/29/16   [provider]  losartan (COZAAR) 50 MG tablet Take 1 tablet (50 mg total) by mouth every morning. 06/26/17   Wendie Agreste, MD  metFORMIN (GLUCOPHAGE) 500 MG tablet Take 1 tablet (500 mg total) by mouth daily with breakfast. 06/26/17   Wendie Agreste, MD  ranitidine (ZANTAC) 300 MG tablet TAKE 1 TABLET BY MOUTH AT  BEDTIME 08/02/17   Wendie Agreste, MD  sildenafil (VIAGRA) 100 MG tablet  TAKE 1/2 TABLET BY MOUTH DAILY AS NEEDED FOR ERECTILE DYSFUNCTION. 06/26/17   Wendie Agreste, MD    Family History Family History  Problem Relation Age of Onset  . Colon cancer Neg Hx   . Colon polyps Neg Hx     Social History Social History   Tobacco Use  . Smoking status: Never Smoker  . Smokeless tobacco: Never Used  Substance Use Topics  . Alcohol use: No  . Drug use: No     Allergies   Patient has no known allergies.   Review of Systems Review of Systems  Constitutional: Positive for chills and diaphoresis. Negative for fever.  Respiratory: Negative for shortness of breath.     Cardiovascular: Negative for chest pain.  Gastrointestinal: Positive for abdominal pain, nausea and vomiting. Negative for blood in stool, constipation and diarrhea.  All other systems reviewed and are negative.   Physical Exam Updated Vital Signs There were no vitals taken for this visit.  Physical Exam  Constitutional: He appears well-developed and well-nourished.  Non-toxic appearance.  Patient mildly diaphoretic.   HENT:  Head: Normocephalic and atraumatic.  Eyes: Conjunctivae are normal. Right eye exhibits no discharge. Left eye exhibits no discharge.  Neck: Neck supple.  Cardiovascular: Normal rate and regular rhythm.  Pulses:      Radial pulses are 2+ on the right side, and 2+ on the left side.  Pulmonary/Chest: Effort normal and breath sounds normal. No respiratory distress. He has no wheezes. He has no rhonchi. He has no rales.  Abdominal: Soft. He exhibits no distension. There is tenderness in the right upper quadrant and epigastric area. There is no rigidity, no rebound, no guarding and no tenderness at McBurney's point.  Neurological: He is alert.  Clear speech.   Skin: Skin is warm and dry. No rash noted.  Psychiatric: He has a normal mood and affect. His behavior is normal.  Nursing note and vitals reviewed.   ED Treatments / Results  Labs Results for orders placed or performed during the hospital encounter of 09/02/17  Lipase, blood  Result Value Ref Range   Lipase 31 11 - 51 U/L  Comprehensive metabolic panel  Result Value Ref Range   Sodium 140 135 - 145 mmol/L   Potassium 3.7 3.5 - 5.1 mmol/L   Chloride 100 98 - 111 mmol/L   CO2 21 (L) 22 - 32 mmol/L   Glucose, Bld 179 (H) 70 - 99 mg/dL   BUN 22 (H) 6 - 20 mg/dL   Creatinine, Ser 1.02 0.61 - 1.24 mg/dL   Calcium 9.5 8.9 - 10.3 mg/dL   Total Protein 7.7 6.5 - 8.1 g/dL   Albumin 4.3 3.5 - 5.0 g/dL   AST 31 15 - 41 U/L   ALT 20 0 - 44 U/L   Alkaline Phosphatase 79 38 - 126 U/L   Total Bilirubin 0.8  0.3 - 1.2 mg/dL   GFR calc non Af Amer >60 >60 mL/min   GFR calc Af Amer >60 >60 mL/min   Anion gap 19 (H) 5 - 15  CBC  Result Value Ref Range   WBC 10.8 (H) 4.0 - 10.5 K/uL   RBC 5.25 4.22 - 5.81 MIL/uL   Hemoglobin 15.8 13.0 - 17.0 g/dL   HCT 45.4 39.0 - 52.0 %   MCV 86.5 78.0 - 100.0 fL   MCH 30.1 26.0 - 34.0 pg   MCHC 34.8 30.0 - 36.0 g/dL   RDW 13.2 11.5 - 15.5 %  Platelets 323 150 - 400 K/uL  Urinalysis, Routine w reflex microscopic  Result Value Ref Range   Color, Urine YELLOW YELLOW   APPearance CLEAR CLEAR   Specific Gravity, Urine 1.029 1.005 - 1.030   pH 5.0 5.0 - 8.0   Glucose, UA NEGATIVE NEGATIVE mg/dL   Hgb urine dipstick NEGATIVE NEGATIVE   Bilirubin Urine NEGATIVE NEGATIVE   Ketones, ur 20 (A) NEGATIVE mg/dL   Protein, ur 30 (A) NEGATIVE mg/dL   Nitrite NEGATIVE NEGATIVE   Leukocytes, UA NEGATIVE NEGATIVE   RBC / HPF 0-5 0 - 5 RBC/hpf   WBC, UA 0-5 0 - 5 WBC/hpf   Bacteria, UA RARE (A) NONE SEEN   Mucus PRESENT    Hyaline Casts, UA PRESENT   I-stat troponin, ED  Result Value Ref Range   Troponin i, poc 0.01 0.00 - 0.08 ng/mL   Comment 3          I-Stat CG4 Lactic Acid, ED  Result Value Ref Range   Lactic Acid, Venous 2.96 (HH) 0.5 - 1.9 mmol/L   Comment NOTIFIED PHYSICIAN   I-Stat CG4 Lactic Acid, ED  Result Value Ref Range   Lactic Acid, Venous 1.61 0.5 - 1.9 mmol/L  I-stat troponin, ED  Result Value Ref Range   Troponin i, poc 0.00 0.00 - 0.08 ng/mL   Comment 3            EKG EKG Interpretation  Date/Time:  Monday September 02 2017 21:42:03 EDT Ventricular Rate:  52 PR Interval:    QRS Duration: 106 QT Interval:  455 QTC Calculation: 424 R Axis:   5 Text Interpretation:  Sinus rhythm Prolonged PR interval Low voltage, precordial leads When compared to prior, no significant changes seen.  No STEMI Confirmed by Antony Blackbird [phone]) on 09/02/2017 9:44:46 PM    Radiology Ct Abdomen Pelvis W Contrast  Result Date: 09/02/2017 CLINICAL  DATA:  Central abdominal pain after eating [facility]. Vomiting. EXAM: CT ABDOMEN AND PELVIS WITH CONTRAST TECHNIQUE: Multidetector CT imaging of the abdomen and pelvis was performed using the standard protocol following bolus administration of intravenous contrast. CONTRAST:  156mL ISOVUE-300 IOPAMIDOL (ISOVUE-300) INJECTION 61% COMPARISON:  08/20/2016 FINDINGS: Lower chest:  No contributory findings. Hepatobiliary: No focal liver abnormality.No evidence of biliary obstruction or stone. Pancreas: Unremarkable. Spleen: Unremarkable. Adrenals/Urinary Tract: Negative adrenals. No hydronephrosis or stone. Unremarkable bladder. Stomach/Bowel: No obstruction. No inflammatory changes. Appendectomy. Mild distal colonic diverticulosis. Vascular/Lymphatic: No acute vascular abnormality. No mass or adenopathy. Incidental dystrophic calcification in the jejunal mesentery Reproductive:No pathologic findings. Other: No ascites or pneumoperitoneum. Musculoskeletal: Diffuse narrowing of discs, greatest at L4-5. Mid and lower lumbar degenerative facet spurring. No acute osseous finding. IMPRESSION: No acute finding. Electronically Signed   By: Monte Fantasia M.D.   On: 09/02/2017 19:51   Dg Abdomen Acute W/chest  Result Date: 09/02/2017 CLINICAL DATA:  Central abdominal pain after eating meat at a cook out today. EXAM: DG ABDOMEN ACUTE W/ 1V CHEST COMPARISON:  None. FINDINGS: The chest is unremarkable. There is a paucity of bowel gas. However, there is no evidence of obstruction. On the right side up decubitus film, no free air is noted. High attenuation in the left mid abdomen adjacent to the left kidney is identified. This is consistent with a collection of calcifications seen in the mesentery on a previous CT scan rather than renal stones. No other acute abnormalities are noted. IMPRESSION: Negative abdominal radiographs.  No acute cardiopulmonary disease. Electronically Signed   By:  Dorise Bullion III M.D   On: 09/02/2017  17:38   US Abdomen Limited Ruq  Result Date: 09/02/2017 CLINICAL DATA:  56 year old male with history of abdominal pain since yesterday evening. EXAM: ULTRASOUND ABDOMEN LIMITED RIGHT UPPER QUADRANT COMPARISON:  None. FINDINGS: Gallbladder: No gallstones or wall thickening visualized. No sonographic Murphy sign noted by sonographer. Common bile duct: Diameter: 2.8 mm Liver: No focal lesion identified. Mild diffuse increased echogenicity of the hepatic parenchyma, suggesting underlying hepatic steatosis. Portal vein is patent on color Doppler imaging with normal direction of blood flow towards the liver. IMPRESSION: 1. No acute findings. Specifically, no gallstones and no evidence of acute cholecystitis. 2. Mild diffuse increased echogenicity in the hepatic parenchyma, suggestive of hepatic steatosis. Electronically Signed   By: Vinnie Langton M.D.   On: 09/02/2017 18:44    Procedures Procedures (including critical care time)  Medications Ordered in ED Medications  ondansetron (ZOFRAN) injection 4 mg (4 mg Intravenous Given 09/02/17 1630)  sodium chloride 0.9 % bolus 1,000 mL (0 mLs Intravenous Stopped 09/02/17 2018)  sodium chloride 0.9 % bolus 1,000 mL (0 mLs Intravenous Stopped 09/02/17 1906)  gi cocktail (Maalox,Lidocaine,Donnatal) (30 mLs Oral Given 09/02/17 2013)  famotidine (PEPCID) IVPB 20 mg premix (0 mg Intravenous Stopped 09/02/17 2035)  iopamidol (ISOVUE-300) 61 % injection 100 mL (100 mLs Intravenous Contrast Given 09/02/17 1922)     Initial Impression / Assessment and Plan / ED Course  I have reviewed the triage vital signs and the nursing notes.  Pertinent labs & imaging results that were available during my care of the patient were reviewed by me and considered in my medical decision making (see chart for details).   Patient presents the ED via EMS for epigastric abdominal pain with associated nausea and vomiting.  On exam patient appears mildly diaphoretic and is tender in the  epigastrium and right upper quadrant without peritoneal signs.  DDX: Cholecystitis, symptomatic cholelithiasis, pancreatitis, ACS, bowel perforation/obstruction, PUD/GERD.  Will further evaluate with abdominal labs, right upper quadrant ultrasound, DG chest/abd series, EKG and troponin.  Antiemetics and fluids ordered.  Initial labs reviewed: Nonspecific leukocytosis at 10.8. No anemia. LFTs and lipase WNL. Creatinine normal, BUN elevated at 22. Patient does have an anion gap of 19, CO2 low at 21, suspect this is secondary to dehydration, will obtain lactic acid.   Lactic acid subsequently elevated at 2.96, will aggressively rehydrate and re-assess as again believe this is secondary to dehydration.   Patient's X-ray and RUQ [name] without acute findings. No evidence of cholecystitis or cholelithiasis. Will further evaluation with CT abdomen/pelvis, trial GI cocktail and pepcid.   Patient CT abdomen pelvis without acute findings, nonsurgical abdomen, doubt pancreatitis, acute cholecystitis, bowel obstruction/perforation, appendicitis, or diverticulitis. Lactic acid normalized following rehydration. Patient's pain is not straight through to the back or into the chest, symmetric pulses, no widening of the mediastinum on chest x-ray, doubt dissection.  No chest pain or shortness of breath raise concern for pulmonary embolism.  Patient without chest pain, delta troponin negative, EKG without significant change from prior, doubt ACS.  Unclear definitive etiology to patient's symptoms, given improvement following GI cocktail and Pepcid query GERD/PUD.  Will place patient on omeprazole with close primary care follow-up. I discussed results, treatment plan, need for PCP follow-up, and return precautions with the patient and his wife at bedside. Provided opportunity for questions, patient confirmed understanding and is in agreement with plan.   Findings and plan of care discussed with supervising physician Dr. Sherry Ruffing  who is in agreement.     Final Clinical Impressions(s) / ED Diagnoses   Final diagnoses:  Abdominal pain    ED Discharge Orders         Ordered    omeprazole (PRILOSEC) 20 MG capsule  Daily     09/02/17 2141           Amaryllis Dyke, PA-C 09/02/17 2259    Tegeler, Gwenyth Allegra, MD 09/03/17 [id number]

## 2017-09-02 NOTE — ED Notes (Signed)
Patient given water

## 2017-09-02 NOTE — ED Notes (Signed)
Patient O2 77% on RA while resting with eyes closed. 96% 2L [place]. PA made aware. Morphine discontinued.

## 2017-09-02 NOTE — ED Notes (Signed)
Patient transported to XR. 

## 2017-09-02 NOTE — ED Triage Notes (Addendum)
Per EMS, patient from home, c/o central abdominal pain after eating meat at a cook out x30 minutes ago. Denies chest pain and SOB.   Patient actively vomiting.   18g LAC 162mcg Fentanyl with EMS

## 2017-09-02 NOTE — ED Notes (Signed)
Patient transported to CT 

## 2017-09-02 NOTE — Discharge Instructions (Addendum)
You were seen in the emergency department today for abdominal pain. Your work-up in the emergency department has been overall reassuring. Your labs have been fairly normal and or similar to previous blood work you have had done -with the exception of findings concerning for dehydration, you were given IV fluids in the emergency department to help with this.  Your liver, kidneys, and pancreas seem to be functioning appropriately.  The imaging we performed today did not show any new findings, you do have some fatty changes to your liver that can be followed up by your primary care provider.  Your EKG and the enzyme we use to check your heart did not show an acute heart attack at this time. Your chest x-ray was normal.  CT scan was normal.  We would like you to continue to take your ranitidine.  We are also adding omeprazole to help with possible acid in the stomach, as we feel this may have contributed his symptoms but cannot be 100% sure.  He states this medication as prescribed. We have prescribed you new medication(s) today. Discuss the medications prescribed today with your pharmacist as they can have adverse effects and interactions with your other medicines including over the counter and prescribed medications. Seek medical evaluation if you start to experience new or abnormal symptoms after taking one of these medicines, seek care immediately if you start to experience difficulty breathing, feeling of your throat closing, facial swelling, or rash as these could be indications of a more serious allergic reaction   We would like you to follow up closely with your primary care provider within 1-3 days. Return to the ER immediately should you experience any new or worsening symptoms including but not limited to return of pain, worsened pain, inability to keep fluids down,, shortness of breath, dizziness, lightheadedness, passing out, fever, or any other concerns that you may have.

## 2017-09-09 ENCOUNTER — Ambulatory Visit (INDEPENDENT_AMBULATORY_CARE_PROVIDER_SITE_OTHER): Payer: 59 | Admitting: Physician Assistant

## 2017-09-09 VITALS — BP 117/70 | HR 63 | Temp 98.1°F | Ht 64.0 in | Wt 226.0 lb

## 2017-09-09 DIAGNOSIS — E559 Vitamin D deficiency, unspecified: Secondary | ICD-10-CM | POA: Diagnosis not present

## 2017-09-09 DIAGNOSIS — E7849 Other hyperlipidemia: Secondary | ICD-10-CM

## 2017-09-09 DIAGNOSIS — E119 Type 2 diabetes mellitus without complications: Secondary | ICD-10-CM | POA: Diagnosis not present

## 2017-09-09 DIAGNOSIS — Z6838 Body mass index (BMI) 38.0-38.9, adult: Secondary | ICD-10-CM

## 2017-09-09 DIAGNOSIS — Z9189 Other specified personal risk factors, not elsewhere classified: Secondary | ICD-10-CM | POA: Diagnosis not present

## 2017-09-09 NOTE — Progress Notes (Signed)
Office: [phone]  /  Fax: [phone]   HPI:   Chief Complaint: OBESITY Shahid is here to discuss his progress with his obesity treatment plan. He is on the portion control better and make smarter food choices, such as increase vegetables and decrease simple carbohydrates and is following his eating plan approximately 0 % of the time. He states he is going to the gym at [facility] for 30 minutes 2 times per week. Joevon was in [place] visiting family for the last 6 weeks. He reports that he ate off of the meal plan while there. He is ready to get back on track.  His weight is 226 lb (102.5 kg) today and has had a weight loss of 1 pound over a period of 8 weeks since his last visit. He has lost 21 lbs since starting treatment with [name].  Hyperlipidemia Eljay has hyperlipidemia and has been trying to improve his cholesterol levels with intensive lifestyle modification including a low saturated fat diet, exercise and weight loss. He is on atorvastatin and denies any chest pain, claudication or myalgias.  Diabetes II Reinhart has a diagnosis of diabetes type II. Azavion is on metformin and denies polyphagia or hypoglycemia. Last A1c was 6.1. He has been working on intensive lifestyle modifications including diet, exercise, and weight loss to help control his blood glucose levels.  At risk for cardiovascular disease Demitrios is at a higher than average risk for cardiovascular disease due to obesity, hyperlipidemia, and diabetes II. He currently denies any chest pain.  Vitamin D Deficiency Rubin has a diagnosis of vitamin D deficiency. He is on OTC Vit D 5,000 IU daily and denies nausea, vomiting or muscle weakness.  ALLERGIES: No Known Allergies  MEDICATIONS: Current Outpatient Medications on File Prior to Visit  Medication Sig Dispense Refill  . atorvastatin (LIPITOR) 10 MG tablet Take 1 tablet (10 mg total) by mouth daily at 6 PM. 90 tablet 1  . Cholecalciferol (VITAMIN D3) 5000 units TABS Take 1  tablet (5,000 Units total) by mouth daily at 12 noon. 30 tablet   . losartan (COZAAR) 50 MG tablet Take 1 tablet (50 mg total) by mouth every morning. 90 tablet 1  . metFORMIN (GLUCOPHAGE) 500 MG tablet Take 1 tablet (500 mg total) by mouth daily with breakfast. 90 tablet 1  . omeprazole (PRILOSEC) 20 MG capsule Take 1 capsule (20 mg total) by mouth daily. 30 capsule 0  . ranitidine (ZANTAC) 300 MG tablet TAKE 1 TABLET BY MOUTH AT  BEDTIME 90 tablet 0  . sildenafil (VIAGRA) 100 MG tablet TAKE 1/2 TABLET BY MOUTH DAILY AS NEEDED FOR ERECTILE DYSFUNCTION. 5 tablet 3   Current Facility-Administered Medications on File Prior to Visit  Medication Dose Route Frequency Provider Last Rate Last Dose  . 0.9 %  sodium chloride infusion  500 mL Intravenous Once Ladene Artist, MD        PAST MEDICAL HISTORY: Past Medical History:  Diagnosis Date  . Allergy    environmental  . Anemia   . Back pain   . Diabetes mellitus   . Environmental allergies 11/27/2011  . GERD (gastroesophageal reflux disease)   . Hyperlipidemia   . Hypertension   . Joint pain   . Knee pain, bilateral   . Sebaceous cyst 11/10/2012  . Snores     PAST SURGICAL HISTORY: Past Surgical History:  Procedure Laterality Date  . APPENDECTOMY    . KNEE SURGERY     right  . MASS EXCISION Bilateral  11/24/2012   Procedure: EXCISION SCALP MASS X2;  Surgeon: [name], MD;  Location: [place];  Service: General;  Laterality: Bilateral;  . SPINE SURGERY  2004   lumb  . UPPER GASTROINTESTINAL ENDOSCOPY      SOCIAL HISTORY: Social History   Tobacco Use  . Smoking status: Never Smoker  . Smokeless tobacco: Never Used  Substance Use Topics  . Alcohol use: No  . Drug use: No    FAMILY HISTORY: Family History  Problem Relation Age of Onset  . Colon cancer Neg Hx   . Colon polyps Neg Hx     ROS: Review of Systems  Constitutional: Positive for weight loss.  Cardiovascular: Negative for chest  pain and claudication.  Gastrointestinal: Negative for nausea and vomiting.  Musculoskeletal: Negative for myalgias.       Negative muscle weakness  Endo/Heme/Allergies:       Negative polyphagia Negative hypoglycemia    PHYSICAL EXAM: Blood pressure 117/70, pulse 63, temperature 98.1 F (36.7 C), temperature source Oral, height 5\' 4"  (1.626 m), weight 226 lb (102.5 kg), SpO2 95 %. Body mass index is 38.79 kg/m. Physical Exam  Constitutional: He appears well-developed and well-nourished.  Cardiovascular: Normal rate.  Pulmonary/Chest: Effort normal.  Musculoskeletal: Normal range of motion.  Skin: Skin is warm and dry.  Psychiatric: He has a normal mood and affect. His behavior is normal.  Vitals reviewed.   RECENT LABS AND TESTS: BMET    Component Value Date/Time   NA 140 09/02/2017 1634   NA 139 02/04/2017 1035   K 3.7 09/02/2017 1634   CL 100 09/02/2017 1634   CO2 21 (L) 09/02/2017 1634   GLUCOSE 179 (H) 09/02/2017 1634   BUN 22 (H) 09/02/2017 1634   BUN 12 02/04/2017 1035   CREATININE 1.02 09/02/2017 1634   CREATININE 0.76 09/15/2015 1525   CALCIUM 9.5 09/02/2017 1634   GFRNONAA >60 09/02/2017 1634   GFRNONAA >89 09/15/2015 1525   GFRAA >60 09/02/2017 1634   GFRAA >89 09/15/2015 1525   Lab Results  Component Value Date   HGBA1C 6.1 (H) 06/11/2017   HGBA1C 6.8 (H) 02/04/2017   HGBA1C 6.8 (H) 12/13/2016   HGBA1C 7.1 (H) 09/13/2016   HGBA1C 7.0 (H) 06/07/2016   Lab Results  Component Value Date   INSULIN 15.5 02/04/2017   CBC    Component Value Date/Time   WBC 10.8 (H) 09/02/2017 1634   RBC 5.25 09/02/2017 1634   HGB 15.8 09/02/2017 1634   HGB 16.0 02/04/2017 1035   HCT 45.4 09/02/2017 1634   HCT 46.1 02/04/2017 1035   PLT 323 09/02/2017 1634   MCV 86.5 09/02/2017 1634   MCV 88 02/04/2017 1035   MCH 30.1 09/02/2017 1634   MCHC 34.8 09/02/2017 1634   RDW 13.2 09/02/2017 1634   RDW 13.4 02/04/2017 1035   LYMPHSABS 1.5 02/04/2017 1035   MONOABS  0.7 08/20/2016 0926   EOSABS 0.1 02/04/2017 1035   BASOSABS 0.1 02/04/2017 1035   Iron/TIBC/Ferritin/ %Sat No results found for: IRON, TIBC, FERRITIN, IRONPCTSAT Lipid Panel     Component Value Date/Time   CHOL 145 02/04/2017 1035   TRIG 77 02/04/2017 1035   HDL 61 02/04/2017 1035   CHOLHDL 2.6 12/13/2016 1632   CHOLHDL 3.0 09/15/2015 1525   VLDL 13 09/15/2015 1525   LDLCALC 69 02/04/2017 1035   Hepatic Function Panel     Component Value Date/Time   PROT 7.7 09/02/2017 1634   PROT 7.3 02/04/2017  1035   ALBUMIN 4.3 09/02/2017 1634   ALBUMIN 4.6 02/04/2017 1035   AST 31 09/02/2017 1634   ALT 20 09/02/2017 1634   ALKPHOS 79 09/02/2017 1634   BILITOT 0.8 09/02/2017 1634   BILITOT 0.4 02/04/2017 1035      Component Value Date/Time   TSH 1.620 02/04/2017 1035    ASSESSMENT AND PLAN: Other hyperlipidemia - Plan: Lipid Panel With LDL/HDL Ratio  Vitamin D deficiency - Plan: VITAMIN D 25 Hydroxy (Vit-D Deficiency, Fractures)  Type 2 diabetes mellitus without complication, without long-term current use of insulin (HCC) - Plan: Hemoglobin A1c, Insulin, random  At risk for heart disease  Class 2 severe obesity with serious comorbidity and body mass index (BMI) of 38.0 to 38.9 in adult, unspecified obesity type ([place])  PLAN:  Hyperlipidemia Horacio was informed of the American Heart Association Guidelines emphasizing intensive lifestyle modifications as the first line treatment for hyperlipidemia. We discussed many lifestyle modifications today in depth, and Jovin will continue to work on decreasing saturated fats such as fatty red meat, butter and many fried foods. Clayden agrees to continue his medications, and he will also increase vegetables and lean protein in his diet and continue to work on diet, exercise, and weight loss efforts. We will check labs and Sadat agrees to follow up with our clinic in 2 to 3 weeks.  Diabetes II Sherill has been given extensive diabetes education by  myself today including ideal fasting and post-prandial blood glucose readings, individual ideal Hgb A1c goals and hypoglycemia prevention. We discussed the importance of good blood sugar control to decrease the likelihood of diabetic complications such as nephropathy, neuropathy, limb loss, blindness, coronary artery disease, and death. We discussed the importance of intensive lifestyle modification including diet, exercise and weight loss as the first line treatment for diabetes. Kurtis agrees to continue his diabetes medications, diet, and exercise. We will check labs and Kodie agrees to follow up with our clinic in 2 to 3 weeks.  Cardiovascular risk counselling Harlie was given extended (15 minutes) coronary artery disease prevention counseling today. He is 57 y.o. male and has risk factors for heart disease including obesity, hyperlipidemia, and diabetes II. We discussed intensive lifestyle modifications today with an emphasis on specific weight loss instructions and strategies. Pt was also informed of the importance of increasing exercise and decreasing saturated fats to help prevent heart disease.  Vitamin D Deficiency Daymeon was informed that low vitamin D levels contributes to fatigue and are associated with obesity, breast, and colon cancer. Doran agrees to continue taking OTC Vit D and will follow up for routine testing of vitamin D, at least 2-3 times per year. He was informed of the risk of over-replacement of vitamin D and agrees to not increase his dose unless he discusses this with [name] first. We will check labs and Raheim agrees to follow up with our clinic in 2 to 3 weeks.  Obesity Paulette is currently in the action stage of change. As such, his goal is to continue with weight loss efforts He has agreed to portion control better and make smarter food choices, such as increase vegetables and decrease simple carbohydrates  Jamerius has been instructed to work up to a goal of 150 minutes of combined  cardio and strengthening exercise per week for weight loss and overall health benefits. We discussed the following Behavioral Modification Strategies today: work on meal planning and easy cooking plans and keeping healthy foods in the home   Hakan has agreed  to follow up with our clinic in 2 to 3 weeks. He was informed of the importance of frequent follow up visits to maximize his success with intensive lifestyle modifications for his multiple health conditions.   OBESITY BEHAVIORAL INTERVENTION VISIT  Today's visit was # 11  Starting weight: 247 lbs Starting date: 02/04/17 Today's weight : 226 lbs  Today's date: 09/09/2017 Total lbs lost to date: 21    ASK: We discussed the diagnosis of obesity with Wallace Keller today and [name] agreed to give [name] permission to discuss obesity behavioral modification therapy today.  ASSESS: Ammon has the diagnosis of obesity and his BMI today is 38.77 Kaileb is in the action stage of change   ADVISE: Tilmon was educated on the multiple health risks of obesity as well as the benefit of weight loss to improve his health. He was advised of the need for long term treatment and the importance of lifestyle modifications.  AGREE: Multiple dietary modification options and treatment options were discussed and  Seon agreed to the above obesity treatment plan.  Wilhemena Durie, am acting as transcriptionist for Abby Potash, PA-C I, Abby Potash, PA-C have reviewed above note and agree with its content

## 2017-09-10 DIAGNOSIS — E119 Type 2 diabetes mellitus without complications: Secondary | ICD-10-CM | POA: Diagnosis not present

## 2017-09-10 DIAGNOSIS — E559 Vitamin D deficiency, unspecified: Secondary | ICD-10-CM | POA: Diagnosis not present

## 2017-09-10 DIAGNOSIS — E7849 Other hyperlipidemia: Secondary | ICD-10-CM | POA: Diagnosis not present

## 2017-09-11 LAB — LIPID PANEL WITH LDL/HDL RATIO
Cholesterol, Total: 161 mg/dL (ref 100–199)
HDL: 72 mg/dL (ref >39)
LDL CALC: 79 mg/dL (ref 0–99)
LDL/HDL RATIO: 1.1 ratio (ref 0.0–3.6)
Triglycerides: 52 mg/dL (ref 0–149)
VLDL CHOLESTEROL CAL: 10 mg/dL (ref 5–40)

## 2017-09-11 LAB — VITAMIN D 25 HYDROXY (VIT D DEFICIENCY, FRACTURES): VIT D 25 HYDROXY: 35.7 ng/mL (ref 30.0–100.0)

## 2017-09-11 LAB — HEMOGLOBIN A1C
Est. average glucose Bld gHb Est-mCnc: 131 mg/dL
Hgb A1c MFr Bld: 6.2 % — ABNORMAL HIGH (ref 4.8–5.6)

## 2017-09-11 LAB — INSULIN, RANDOM: INSULIN: 5.7 u[IU]/mL (ref 2.6–24.9)

## 2017-09-12 ENCOUNTER — Ambulatory Visit: Payer: 59 | Admitting: Family Medicine

## 2017-09-30 ENCOUNTER — Ambulatory Visit (INDEPENDENT_AMBULATORY_CARE_PROVIDER_SITE_OTHER): Payer: 59 | Admitting: Physician Assistant

## 2017-11-06 ENCOUNTER — Other Ambulatory Visit: Payer: Self-pay | Admitting: Family Medicine

## 2017-11-06 DIAGNOSIS — N529 Male erectile dysfunction, unspecified: Secondary | ICD-10-CM

## 2017-11-06 NOTE — Telephone Encounter (Signed)
Requested Prescriptions  Pending Prescriptions Disp Refills  . sildenafil (VIAGRA) 100 MG tablet [Pharmacy Med Name: SILDENAFIL 100MG  TABLETS] 5 tablet 0    Sig: TAKE 1/2 TABLET BY MOUTH DAILY AS NEEDED FOR ERECTILE DYSFUNCTION.     Urology: Erectile Dysfunction Agents Passed - 11/06/2017  3:45 AM      Passed - Last BP in normal range    BP Readings from Last 1 Encounters:  09/09/17 117/70         Passed - Valid encounter within last 12 months    Recent Outpatient Visits          4 months ago Annual physical exam   [facility], Ranell Patrick, MD   8 months ago Essential hypertension   [facility], Ranell Patrick, MD   10 months ago Hyperlipidemia, unspecified hyperlipidemia type   [facility], Ranell Patrick, MD   1 year ago Type 2 diabetes mellitus without complication, without long-term current use of insulin [facility])   [facility], Ranell Patrick, MD   1 year ago Type 2 diabetes mellitus without complication, without long-term current use of insulin [facility])   [facility], Ranell Patrick, MD      Future Appointments            In 1 month Carlota Raspberry Ranell Patrick, MD [facility] [place], [facility]

## 2017-11-20 ENCOUNTER — Other Ambulatory Visit: Payer: Self-pay | Admitting: Family Medicine

## 2017-11-20 ENCOUNTER — Ambulatory Visit (INDEPENDENT_AMBULATORY_CARE_PROVIDER_SITE_OTHER): Payer: 59 | Admitting: Physician Assistant

## 2017-11-20 VITALS — BP 132/75 | HR 58 | Temp 97.9°F | Ht 64.0 in | Wt 234.0 lb

## 2017-11-20 DIAGNOSIS — E119 Type 2 diabetes mellitus without complications: Secondary | ICD-10-CM

## 2017-11-20 DIAGNOSIS — Z6841 Body Mass Index (BMI) 40.0 and over, adult: Secondary | ICD-10-CM | POA: Diagnosis not present

## 2017-11-20 DIAGNOSIS — K219 Gastro-esophageal reflux disease without esophagitis: Secondary | ICD-10-CM

## 2017-11-25 NOTE — Progress Notes (Signed)
Office: [phone]  /  Fax: [phone]   HPI:   Chief Complaint: OBESITY Matthew Garner is here to discuss his progress with his obesity treatment plan. He is on the portion control better and make smarter food choices, such as increase vegetables and decrease simple carbohydrates and is following his eating plan approximately 75 % of the time. He states he is swimming for 30 minutes 1 time per week. Matthew Garner reports skipping dinner regularly due to being excessively tired when he comes home from work. He is ready to get back on track. He also reports stopping all of his medications because he thought his labs were good. His weight is 234 lb (106.1 kg) today and has gained 8 pounds since his last visit. He has lost 13 lbs since starting treatment with [name].  Diabetes II Matthew Garner has a diagnosis of diabetes type II. He stopped taking his metformin after looking at his labs, as he thought his A1c was good enough to stop his medications. Matthew Garner states fasting BGs range between 100 and 105. He denies hypoglycemia. Last A1c was 6.2. He has been working on intensive lifestyle modifications including diet, exercise, and weight loss to help control his blood glucose levels.  ALLERGIES: No Known Allergies  MEDICATIONS: Current Outpatient Medications on File Prior to Visit  Medication Sig Dispense Refill  . atorvastatin (LIPITOR) 10 MG tablet Take 1 tablet (10 mg total) by mouth daily at 6 PM. (Patient not taking: Reported on 11/20/2017) 90 tablet 1  . Cholecalciferol (VITAMIN D3) 5000 units TABS Take 1 tablet (5,000 Units total) by mouth daily at 12 noon. (Patient not taking: Reported on 11/20/2017) 30 tablet   . losartan (COZAAR) 50 MG tablet Take 1 tablet (50 mg total) by mouth every morning. (Patient not taking: Reported on 11/20/2017) 90 tablet 1  . metFORMIN (GLUCOPHAGE) 500 MG tablet Take 1 tablet (500 mg total) by mouth daily with breakfast. (Patient not taking: Reported on 11/20/2017) 90 tablet 1  .  omeprazole (PRILOSEC) 20 MG capsule Take 1 capsule (20 mg total) by mouth daily. (Patient not taking: Reported on 11/20/2017) 30 capsule 0  . sildenafil (VIAGRA) 100 MG tablet TAKE 1/2 TABLET BY MOUTH DAILY AS NEEDED FOR ERECTILE DYSFUNCTION. (Patient not taking: Reported on 11/20/2017) 5 tablet 3   Current Facility-Administered Medications on File Prior to Visit  Medication Dose Route Frequency Provider Last Rate Last Dose  . 0.9 %  sodium chloride infusion  500 mL Intravenous Once Ladene Artist, MD        PAST MEDICAL HISTORY: Past Medical History:  Diagnosis Date  . Allergy    environmental  . Anemia   . Back pain   . Diabetes mellitus   . Environmental allergies 11/27/2011  . GERD (gastroesophageal reflux disease)   . Hyperlipidemia   . Hypertension   . Joint pain   . Knee pain, bilateral   . Sebaceous cyst 11/10/2012  . Snores     PAST SURGICAL HISTORY: Past Surgical History:  Procedure Laterality Date  . APPENDECTOMY    . KNEE SURGERY     right  . MASS EXCISION Bilateral 11/24/2012   Procedure: EXCISION SCALP MASS X2;  Surgeon: [name], MD;  Location: [facility];  Service: General;  Laterality: Bilateral;  . SPINE SURGERY  2004   lumb  . UPPER GASTROINTESTINAL ENDOSCOPY      SOCIAL HISTORY: Social History   Tobacco Use  . Smoking status: Never Smoker  . Smokeless tobacco:  Never Used  Substance Use Topics  . Alcohol use: No  . Drug use: No    FAMILY HISTORY: Family History  Problem Relation Age of Onset  . Colon cancer Neg Hx   . Colon polyps Neg Hx     ROS: Review of Systems  Constitutional: Negative for weight loss.  Endo/Heme/Allergies:       Negative hypoglycemia    PHYSICAL EXAM: Blood pressure 132/75, pulse (!) 58, temperature 97.9 F (36.6 C), temperature source Oral, height 5\' 4"  (1.626 m), weight 234 lb (106.1 kg), SpO2 99 %. Body mass index is 40.17 kg/m. Physical Exam  Constitutional: He is oriented to  person, place, and time. He appears well-developed and well-nourished.  Cardiovascular: Normal rate.  Pulmonary/Chest: Effort normal.  Musculoskeletal: Normal range of motion.  Neurological: He is oriented to person, place, and time.  Skin: Skin is warm and dry.  Psychiatric: He has a normal mood and affect. His behavior is normal.  Vitals reviewed.   RECENT LABS AND TESTS: BMET    Component Value Date/Time   NA 140 09/02/2017 1634   NA 139 02/04/2017 1035   K 3.7 09/02/2017 1634   CL 100 09/02/2017 1634   CO2 21 (L) 09/02/2017 1634   GLUCOSE 179 (H) 09/02/2017 1634   BUN 22 (H) 09/02/2017 1634   BUN 12 02/04/2017 1035   CREATININE 1.02 09/02/2017 1634   CREATININE 0.76 09/15/2015 1525   CALCIUM 9.5 09/02/2017 1634   GFRNONAA >60 09/02/2017 1634   GFRNONAA >89 09/15/2015 1525   GFRAA >60 09/02/2017 1634   GFRAA >89 09/15/2015 1525   Lab Results  Component Value Date   HGBA1C 6.2 (H) 09/10/2017   HGBA1C 6.1 (H) 06/11/2017   HGBA1C 6.8 (H) 02/04/2017   HGBA1C 6.8 (H) 12/13/2016   HGBA1C 7.1 (H) 09/13/2016   Lab Results  Component Value Date   INSULIN 5.7 09/10/2017   INSULIN 15.5 02/04/2017   CBC    Component Value Date/Time   WBC 10.8 (H) 09/02/2017 1634   RBC 5.25 09/02/2017 1634   HGB 15.8 09/02/2017 1634   HGB 16.0 02/04/2017 1035   HCT 45.4 09/02/2017 1634   HCT 46.1 02/04/2017 1035   PLT 323 09/02/2017 1634   MCV 86.5 09/02/2017 1634   MCV 88 02/04/2017 1035   MCH 30.1 09/02/2017 1634   MCHC 34.8 09/02/2017 1634   RDW 13.2 09/02/2017 1634   RDW 13.4 02/04/2017 1035   LYMPHSABS 1.5 02/04/2017 1035   MONOABS 0.7 08/20/2016 0926   EOSABS 0.1 02/04/2017 1035   BASOSABS 0.1 02/04/2017 1035   Iron/TIBC/Ferritin/ %Sat No results found for: IRON, TIBC, FERRITIN, IRONPCTSAT Lipid Panel     Component Value Date/Time   CHOL 161 09/10/2017 0746   TRIG 52 09/10/2017 0746   HDL 72 09/10/2017 0746   CHOLHDL 2.6 12/13/2016 1632   CHOLHDL 3.0 09/15/2015  1525   VLDL 13 09/15/2015 1525   LDLCALC 79 09/10/2017 0746   Hepatic Function Panel     Component Value Date/Time   PROT 7.7 09/02/2017 1634   PROT 7.3 02/04/2017 1035   ALBUMIN 4.3 09/02/2017 1634   ALBUMIN 4.6 02/04/2017 1035   AST 31 09/02/2017 1634   ALT 20 09/02/2017 1634   ALKPHOS 79 09/02/2017 1634   BILITOT 0.8 09/02/2017 1634   BILITOT 0.4 02/04/2017 1035      Component Value Date/Time   TSH 1.620 02/04/2017 1035    ASSESSMENT AND PLAN: Type 2 diabetes mellitus without complication, without  long-term current use of insulin (HCC)  Class 3 severe obesity with serious comorbidity and body mass index (BMI) of 40.0 to 44.9 in adult, unspecified obesity type ([place])  PLAN:  Diabetes II Matthew Garner has been given extensive diabetes education by myself today including ideal fasting and post-prandial blood glucose readings, individual ideal Hgb A1c goals and hypoglycemia prevention. We discussed the importance of good blood sugar control to decrease the likelihood of diabetic complications such as nephropathy, neuropathy, limb loss, blindness, coronary artery disease, and death. We discussed the importance of intensive lifestyle modification including diet, exercise and weight loss as the first line treatment for diabetes. Matthew Garner agrees to continue taking metformin and he agrees to follow up with our clinic in 2 to 3 weeks.  I spent > than 50% of the 15 minute visit on counseling as documented in the note.  Obesity Matthew Garner is currently in the action stage of change. As such, his goal is to continue with weight loss efforts He has agreed to follow the Category 2 plan Matthew Garner has been instructed to work up to a goal of 150 minutes of combined cardio and strengthening exercise per week for weight loss and overall health benefits. We discussed the following Behavioral Modification Strategies today: increasing lean protein intake, no skipping meals, and work on meal planning and easy cooking  plans   Matthew Garner has agreed to follow up with our clinic in 2 to 3 weeks. He was informed of the importance of frequent follow up visits to maximize his success with intensive lifestyle modifications for his multiple health conditions.   OBESITY BEHAVIORAL INTERVENTION VISIT  Today's visit was # 12   Starting weight: 247 lbs Starting date: 02/04/17 Today's weight : 234 lbs  Today's date: 11/20/2017 Total lbs lost to date: 13    ASK: We discussed the diagnosis of obesity with Matthew Garner today and Matthew Garner agreed to give [name] permission to discuss obesity behavioral modification therapy today.  ASSESS: Matthew Garner has the diagnosis of obesity and his BMI today is 40.15 Matthew Garner is in the action stage of change   ADVISE: Matthew Garner was educated on the multiple health risks of obesity as well as the benefit of weight loss to improve his health. He was advised of the need for long term treatment and the importance of lifestyle modifications.  AGREE: Multiple dietary modification options and treatment options were discussed and  Matthew Garner agreed to the above obesity treatment plan.  Matthew Garner, am acting as transcriptionist for Abby Potash, PA-C I, Abby Potash, PA-C have reviewed above note and agree with its content

## 2017-12-02 DIAGNOSIS — M170 Bilateral primary osteoarthritis of knee: Secondary | ICD-10-CM | POA: Diagnosis not present

## 2017-12-06 ENCOUNTER — Ambulatory Visit (INDEPENDENT_AMBULATORY_CARE_PROVIDER_SITE_OTHER): Payer: 59 | Admitting: Family Medicine

## 2017-12-06 ENCOUNTER — Encounter: Payer: Self-pay | Admitting: Family Medicine

## 2017-12-06 VITALS — BP 133/78 | HR 65 | Temp 98.7°F | Resp 16 | Ht 64.0 in | Wt 233.0 lb

## 2017-12-06 DIAGNOSIS — E119 Type 2 diabetes mellitus without complications: Secondary | ICD-10-CM | POA: Diagnosis not present

## 2017-12-06 DIAGNOSIS — E785 Hyperlipidemia, unspecified: Secondary | ICD-10-CM | POA: Diagnosis not present

## 2017-12-06 DIAGNOSIS — N529 Male erectile dysfunction, unspecified: Secondary | ICD-10-CM

## 2017-12-06 DIAGNOSIS — I10 Essential (primary) hypertension: Secondary | ICD-10-CM

## 2017-12-06 MED ORDER — SILDENAFIL CITRATE 100 MG PO TABS: ORAL_TABLET | ORAL | 3 refills | Status: DC

## 2017-12-06 MED ORDER — LOSARTAN POTASSIUM 50 MG PO TABS: 50.0000 mg | ORAL_TABLET | Freq: Every morning | ORAL | 1 refills | Status: DC

## 2017-12-06 MED ORDER — ATORVASTATIN CALCIUM 10 MG PO TABS: 10.0000 mg | ORAL_TABLET | Freq: Every day | ORAL | 1 refills | Status: DC

## 2017-12-06 MED ORDER — METFORMIN HCL 500 MG PO TABS: 500.0000 mg | ORAL_TABLET | Freq: Every day | ORAL | 1 refills | Status: DC

## 2017-12-06 NOTE — Progress Notes (Signed)
Subjective:    Patient ID: Matthew Garner, male    DOB: August 30, 1960, 57 y.o.   MRN: [id number]  HPI Matthew Garner is a 57 y.o. male Presents today for: Chief Complaint  Patient presents with  . Diabetes   Diabetes: Lab Results  Component Value Date   HGBA1C 6.2 (H) 09/10/2017   HGBA1C 6.1 (H) 06/11/2017   HGBA1C 6.8 (H) 02/04/2017   Lab Results  Component Value Date   MICROALBUR 2.5 (H) 08/03/2014   LDLCALC 79 09/10/2017   CREATININE 1.02 09/02/2017   Wt Readings from Last 3 Encounters:  12/06/17 233 lb (105.7 kg)  11/20/17 234 lb (106.1 kg)  09/09/17 226 lb (102.5 kg)  02/04/17 had microalbumin.  Currently metformin 500 mg daily (had been off for a few weeks as home readings looked ok, back on for past 3 weeks. Does take ARB and statin.  Due for ophthalmology exam, foot exam September 2018.  Pneumovax 2018. Followed by [facility]. Still recommended meds.  No new side effects of meds.  Saw optho in September - will bring letter.  Exercise limited by knee pain, followed by orthopedics.  Health maintenance, due for Tdap-declines. Had flu shot in November at work.   Hypertension: BP Readings from Last 3 Encounters:  12/06/17 133/78  11/20/17 132/75  09/09/17 117/70   Lab Results  Component Value Date   CREATININE 1.02 09/02/2017  Currently on losartan 50 mg daily. No new side effects. Had been off for a few weeks, now back on.   Hyperlipidemia:  Lab Results  Component Value Date   CHOL 161 09/10/2017   HDL 72 09/10/2017   LDLCALC 79 09/10/2017   TRIG 52 09/10/2017   CHOLHDL 2.6 12/13/2016   Lab Results  Component Value Date   ALT 20 09/02/2017   AST 31 09/02/2017   ALKPHOS 79 09/02/2017   BILITOT 0.8 09/02/2017  on Lipitor 10mg  QD. Had come for a few weeks, but back on meds now.   Erectile dysfunction: Prescribed viagra in past -taking 1/2 pill, no CP, HA or [place]. Plan on am labs for testosterone.  Some relief with Viagra, no new side  effects.  Patient Active Problem List   Diagnosis Date Noted  . Type 2 diabetes mellitus without complication, without long-term current use of insulin ([place]) 03/04/2017  . Vitamin D deficiency 03/04/2017  . Other fatigue 02/04/2017  . Shortness of breath on exertion 02/04/2017  . Knee pain, bilateral 03/29/2015  . Nonspecific abnormal electrocardiogram (ECG) (EKG) 04/21/2014  . Spinal stenosis of lumbar region 10/27/2013  . Pain of left calf 06/16/2013  . Diabetes ([place]) 11/27/2011  . Hyperlipidemia 11/27/2011  . Hypertension 11/27/2011   Past Medical History:  Diagnosis Date  . Allergy    environmental  . Anemia   . Back pain   . Diabetes mellitus   . Environmental allergies 11/27/2011  . GERD (gastroesophageal reflux disease)   . Hyperlipidemia   . Hypertension   . Joint pain   . Knee pain, bilateral   . Sebaceous cyst 11/10/2012  . Snores    Past Surgical History:  Procedure Laterality Date  . APPENDECTOMY    . KNEE SURGERY     right  . MASS EXCISION Bilateral 11/24/2012   Procedure: EXCISION SCALP MASS X2;  Surgeon: [name], MD;  Location: [place];  Service: General;  Laterality: Bilateral;  . SPINE SURGERY  2004   lumb  . UPPER GASTROINTESTINAL ENDOSCOPY  No Known Allergies Prior to Admission medications   Medication Sig Start Date End Date Taking? Authorizing Provider  atorvastatin (LIPITOR) 10 MG tablet Take 1 tablet (10 mg total) by mouth daily at 6 PM. 06/26/17  Yes Wendie Agreste, MD  Cholecalciferol (VITAMIN D3) 5000 units TABS Take 1 tablet (5,000 Units total) by mouth daily at 12 noon. 03/07/17  Yes Leafy Ro, Caren D, MD  losartan (COZAAR) 50 MG tablet Take 1 tablet (50 mg total) by mouth every morning. 06/26/17  Yes Wendie Agreste, MD  metFORMIN (GLUCOPHAGE) 500 MG tablet Take 1 tablet (500 mg total) by mouth daily with breakfast. 06/26/17  Yes Wendie Agreste, MD  omeprazole (PRILOSEC) 20 MG capsule Take 1 capsule (20  mg total) by mouth daily. 09/02/17  Yes Petrucelli, Samantha R, PA-C  ranitidine (ZANTAC) 300 MG tablet TAKE 1 TABLET BY MOUTH AT  BEDTIME 11/20/17  Yes Wendie Agreste, MD  sildenafil (VIAGRA) 100 MG tablet TAKE 1/2 TABLET BY MOUTH DAILY AS NEEDED FOR ERECTILE DYSFUNCTION. 06/26/17  Yes Wendie Agreste, MD   Social History   Socioeconomic History  . Marital status: Married    Spouse name: [name]  . Number of children: 3  . Years of education: Not on file  . Highest education level: Not on file  Occupational History  . Occupation: Works in Location manager  . Financial resource strain: Not on file  . Food insecurity:    Worry: Not on file    Inability: Not on file  . Transportation needs:    Medical: Not on file    Non-medical: Not on file  Tobacco Use  . Smoking status: Never Smoker  . Smokeless tobacco: Never Used  Substance and Sexual Activity  . Alcohol use: No  . Drug use: No  . Sexual activity: Not on file  Lifestyle  . Physical activity:    Days per week: Not on file    Minutes per session: Not on file  . Stress: Not on file  Relationships  . Social connections:    Talks on phone: Not on file    Gets together: Not on file    Attends religious service: Not on file    Active member of club or organization: Not on file    Attends meetings of clubs or organizations: Not on file    Relationship status: Not on file  . Intimate partner violence:    Fear of current or ex partner: Not on file    Emotionally abused: Not on file    Physically abused: Not on file    Forced sexual activity: Not on file  Other Topics Concern  . Not on file  Social History Narrative  . Not on file    Review of Systems  Constitutional: Negative for fatigue and unexpected weight change.  Eyes: Negative for visual disturbance.  Respiratory: Negative for cough, chest tightness and shortness of breath.   Cardiovascular: Negative for chest pain, palpitations and leg swelling.   Gastrointestinal: Negative for abdominal pain and blood in stool.  Musculoskeletal: Positive for arthralgias (knees. ).  Neurological: Negative for dizziness, light-headedness and headaches.       Objective:   Physical Exam  Constitutional: He is oriented to person, place, and time. He appears well-developed and well-nourished.  HENT:  Head: Normocephalic and atraumatic.  Eyes: Pupils are equal, round, and reactive to light. EOM are normal.  Neck: No JVD present. Carotid bruit is not present.  Cardiovascular:  Normal rate, regular rhythm and normal heart sounds.  No murmur heard. Pulmonary/Chest: Effort normal and breath sounds normal. He has no rales.  Musculoskeletal: He exhibits no edema.  Neurological: He is alert and oriented to person, place, and time.  Skin: Skin is warm and dry.  Psychiatric: He has a normal mood and affect.  Vitals reviewed.  Vitals:   12/06/17 0942  BP: 133/78  Pulse: 65  Resp: 16  Temp: 98.7 F (37.1 C)  TempSrc: Oral  SpO2: 96%  Weight: 233 lb (105.7 kg)  Height: 5\' 4"  (1.626 m)       Assessment & Plan:   Demarlo Riojas is a 57 y.o. male Erectile dysfunction, unspecified erectile dysfunction type - Plan: Testosterone, Free, Total, SHBG, sildenafil (VIAGRA) 100 MG tablet  - viagra Rx given - use lowest effective dose. Side effects discussed (including but not limited to headache/flushing, blue discoloration of vision, possible vascular steal and risk of cardiac effects if underlying unknown coronary artery disease, and permanent sensorineural hearing loss). Understanding expressed.  -Check testosterone, need for repeat testing if low was discussed  Hyperlipidemia, unspecified hyperlipidemia type - Plan: atorvastatin (LIPITOR) 10 MG tablet  -Encouraged continued compliance with Lipitor, tolerating current dose, no changes.  Plan on fasting labs at future visit.  Essential hypertension - Plan: losartan (COZAAR) 50 MG tablet  -  Stable, tolerating  current regimen. Medications refilled. Labs pending as above.   Diabetes mellitus without complication ([place]) - Plan: HM Diabetes Foot Exam, Hemoglobin A1c, metFORMIN (GLUCOPHAGE) 500 MG tablet Type 2 diabetes mellitus without complication, without long-term current use of insulin ([place]) - Plan: metFORMIN (GLUCOPHAGE) 500 MG tablet  -Continue follow-up with counseling weight management center.  Exercise unfortunately limited due to knee pain that has been followed by orthopedics.  Diet may be biggest area for improvement.  Recommended to continue metformin even with control of A1c previously.  Check A1c and follow-up in 6 months.   Meds ordered this encounter  Medications  . atorvastatin (LIPITOR) 10 MG tablet    Sig: Take 1 tablet (10 mg total) by mouth daily at 6 PM.    Dispense:  90 tablet    Refill:  1  . losartan (COZAAR) 50 MG tablet    Sig: Take 1 tablet (50 mg total) by mouth every morning.    Dispense:  90 tablet    Refill:  1  . metFORMIN (GLUCOPHAGE) 500 MG tablet    Sig: Take 1 tablet (500 mg total) by mouth daily with breakfast.    Dispense:  90 tablet    Refill:  1  . sildenafil (VIAGRA) 100 MG tablet    Sig: TAKE 1/2 TABLET BY MOUTH DAILY AS NEEDED FOR ERECTILE DYSFUNCTION.    Dispense:  5 tablet    Refill:  3   Patient Instructions    I refilled medications the same for now.  No changes.  Follow-up in 6 months for diabetes.  If testosterone levels are low those will need to be repeated at least once to verify.  Thank you for coming in today.   If you have lab work done today you will be contacted with your lab results within the next 2 weeks.  If you have not heard from [name] then please contact us. The fastest way to get your results is to register for My Chart.   IF you received an x-ray today, you will receive an invoice from [facility]. Please contact [facility] at [phone] with questions  or concerns regarding your invoice.   IF you received  labwork today, you will receive an invoice from [facility]. Please contact [facility] at [phone] with questions or concerns regarding your invoice.   Our billing staff will not be able to assist you with questions regarding bills from these companies.  You will be contacted with the lab results as soon as they are available. The fastest way to get your results is to activate your My Chart account. Instructions are located on the last page of this paperwork. If you have not heard from [name] regarding the results in 2 weeks, please contact this office.       Signed,   [name], MD [facility].  12/06/17 10:38 AM

## 2017-12-06 NOTE — Patient Instructions (Addendum)
  I refilled medications the same for now.  No changes.  Follow-up in 6 months for diabetes.  If testosterone levels are low those will need to be repeated at least once to verify.  Thank you for coming in today.   If you have lab work done today you will be contacted with your lab results within the next 2 weeks.  If you have not heard from [name] then please contact us. The fastest way to get your results is to register for My Chart.   IF you received an x-ray today, you will receive an invoice from [facility]. Please contact [facility] at [phone] with questions or concerns regarding your invoice.   IF you received labwork today, you will receive an invoice from [facility]. Please contact [facility] at [phone] with questions or concerns regarding your invoice.   Our billing staff will not be able to assist you with questions regarding bills from these companies.  You will be contacted with the lab results as soon as they are available. The fastest way to get your results is to activate your My Chart account. Instructions are located on the last page of this paperwork. If you have not heard from [name] regarding the results in 2 weeks, please contact this office.

## 2017-12-07 LAB — HEMOGLOBIN A1C
ESTIMATED AVERAGE GLUCOSE: 134 mg/dL
HEMOGLOBIN A1C: 6.3 % — AB (ref 4.8–5.6)

## 2017-12-07 LAB — TESTOSTERONE, FREE, TOTAL, SHBG
Sex Hormone Binding: 51.2 nmol/L (ref 19.3–76.4)
TESTOSTERONE FREE: 7.4 pg/mL (ref 7.2–24.0)
TESTOSTERONE: 438 ng/dL (ref 264–916)

## 2017-12-10 ENCOUNTER — Encounter (INDEPENDENT_AMBULATORY_CARE_PROVIDER_SITE_OTHER): Payer: Self-pay | Admitting: Physician Assistant

## 2017-12-10 ENCOUNTER — Ambulatory Visit (INDEPENDENT_AMBULATORY_CARE_PROVIDER_SITE_OTHER): Payer: 59 | Admitting: Physician Assistant

## 2017-12-10 VITALS — BP 119/70 | HR 55 | Temp 98.5°F | Ht 64.0 in | Wt 229.0 lb

## 2017-12-10 DIAGNOSIS — Z6839 Body mass index (BMI) 39.0-39.9, adult: Secondary | ICD-10-CM | POA: Diagnosis not present

## 2017-12-10 DIAGNOSIS — E119 Type 2 diabetes mellitus without complications: Secondary | ICD-10-CM | POA: Diagnosis not present

## 2017-12-10 NOTE — Progress Notes (Signed)
Office: [phone]  /  Fax: [phone]   HPI:   Chief Complaint: OBESITY Matthew Garner is here to discuss his progress with his obesity treatment plan. He is on the  follow the Category 3 plan and is following his eating plan approximately 90 % of the time. He states he is exercising by walking for 15 minutes 4 times per week. Matthew Garner did well with weight loss. He reports that he restarted all of his medications. He has been following the plan closely.  His weight is 229 lb (103.9 kg) today and has had a weight loss of 5 pounds over a period of 3 weeks since his last visit. He has lost 18 lbs since starting treatment with [name].  Diabetes II Matthew Garner has a diagnosis of diabetes type II. Matthew Garner states fasting BGs range between 121 and 125 and denies any hypoglycemic episodes, polyphagia, nausea, vomiting, diarrhea. He is currently on metformin. Last A1c was 6.3.  He has been working on intensive lifestyle modifications including diet, exercise, and weight loss to help control his blood glucose levels.   ALLERGIES: No Known Allergies  MEDICATIONS: Current Outpatient Medications on File Prior to Visit  Medication Sig Dispense Refill  . atorvastatin (LIPITOR) 10 MG tablet Take 1 tablet (10 mg total) by mouth daily at 6 PM. 90 tablet 1  . Cholecalciferol (VITAMIN D3) 5000 units TABS Take 1 tablet (5,000 Units total) by mouth daily at 12 noon. 30 tablet   . losartan (COZAAR) 50 MG tablet Take 1 tablet (50 mg total) by mouth every morning. 90 tablet 1  . metFORMIN (GLUCOPHAGE) 500 MG tablet Take 1 tablet (500 mg total) by mouth daily with breakfast. 90 tablet 1  . omeprazole (PRILOSEC) 20 MG capsule Take 1 capsule (20 mg total) by mouth daily. 30 capsule 0  . ranitidine (ZANTAC) 300 MG tablet TAKE 1 TABLET BY MOUTH AT  BEDTIME 90 tablet 0  . sildenafil (VIAGRA) 100 MG tablet TAKE 1/2 TABLET BY MOUTH DAILY AS NEEDED FOR ERECTILE DYSFUNCTION. 5 tablet 3   Current Facility-Administered Medications on File  Prior to Visit  Medication Dose Route Frequency Provider Last Rate Last Dose  . 0.9 %  sodium chloride infusion  500 mL Intravenous Once Ladene Artist, MD        PAST MEDICAL HISTORY: Past Medical History:  Diagnosis Date  . Allergy    environmental  . Anemia   . Back pain   . Diabetes mellitus   . Environmental allergies 11/27/2011  . GERD (gastroesophageal reflux disease)   . Hyperlipidemia   . Hypertension   . Joint pain   . Knee pain, bilateral   . Sebaceous cyst 11/10/2012  . Snores     PAST SURGICAL HISTORY: Past Surgical History:  Procedure Laterality Date  . APPENDECTOMY    . KNEE SURGERY     right  . MASS EXCISION Bilateral 11/24/2012   Procedure: EXCISION SCALP MASS X2;  Surgeon: [name], MD;  Location: [place];  Service: General;  Laterality: Bilateral;  . SPINE SURGERY  2004   lumb  . UPPER GASTROINTESTINAL ENDOSCOPY      SOCIAL HISTORY: Social History   Tobacco Use  . Smoking status: Never Smoker  . Smokeless tobacco: Never Used  Substance Use Topics  . Alcohol use: No  . Drug use: No    FAMILY HISTORY: Family History  Problem Relation Age of Onset  . Colon cancer Neg Hx   . Colon polyps Neg  Hx     ROS: Review of Systems  Constitutional: Positive for weight loss.  Gastrointestinal: Negative for diarrhea, nausea and vomiting.  Endo/Heme/Allergies:       Negative for hypoglycemia Negative for polyphagia     PHYSICAL EXAM: Blood pressure 119/70, pulse (!) 55, temperature 98.5 F (36.9 C), temperature source Oral, height 5\' 4"  (1.626 m), weight 229 lb (103.9 kg), SpO2 98 %. Body mass index is 39.31 kg/m. Physical Exam  Constitutional: He is oriented to person, place, and time. He appears well-developed and well-nourished.  HENT:  Head: Normocephalic.  Eyes: Pupils are equal, round, and reactive to light.  Neck: Normal range of motion.  Cardiovascular: Normal rate.  Pulmonary/Chest: Effort normal.    Musculoskeletal: Normal range of motion.  Neurological: He is alert and oriented to person, place, and time.  Skin: Skin is warm and dry.  Psychiatric: He has a normal mood and affect. His behavior is normal.  Vitals reviewed.   RECENT LABS AND TESTS: BMET    Component Value Date/Time   NA 140 09/02/2017 1634   NA 139 02/04/2017 1035   K 3.7 09/02/2017 1634   CL 100 09/02/2017 1634   CO2 21 (L) 09/02/2017 1634   GLUCOSE 179 (H) 09/02/2017 1634   BUN 22 (H) 09/02/2017 1634   BUN 12 02/04/2017 1035   CREATININE 1.02 09/02/2017 1634   CREATININE 0.76 09/15/2015 1525   CALCIUM 9.5 09/02/2017 1634   GFRNONAA >60 09/02/2017 1634   GFRNONAA >89 09/15/2015 1525   GFRAA >60 09/02/2017 1634   GFRAA >89 09/15/2015 1525   Lab Results  Component Value Date   HGBA1C 6.3 (H) 12/06/2017   HGBA1C 6.2 (H) 09/10/2017   HGBA1C 6.1 (H) 06/11/2017   HGBA1C 6.8 (H) 02/04/2017   HGBA1C 6.8 (H) 12/13/2016   Lab Results  Component Value Date   INSULIN 5.7 09/10/2017   INSULIN 15.5 02/04/2017   CBC    Component Value Date/Time   WBC 10.8 (H) 09/02/2017 1634   RBC 5.25 09/02/2017 1634   HGB 15.8 09/02/2017 1634   HGB 16.0 02/04/2017 1035   HCT 45.4 09/02/2017 1634   HCT 46.1 02/04/2017 1035   PLT 323 09/02/2017 1634   MCV 86.5 09/02/2017 1634   MCV 88 02/04/2017 1035   MCH 30.1 09/02/2017 1634   MCHC 34.8 09/02/2017 1634   RDW 13.2 09/02/2017 1634   RDW 13.4 02/04/2017 1035   LYMPHSABS 1.5 02/04/2017 1035   MONOABS 0.7 08/20/2016 0926   EOSABS 0.1 02/04/2017 1035   BASOSABS 0.1 02/04/2017 1035   Iron/TIBC/Ferritin/ %Sat No results found for: IRON, TIBC, FERRITIN, IRONPCTSAT Lipid Panel     Component Value Date/Time   CHOL 161 09/10/2017 0746   TRIG 52 09/10/2017 0746   HDL 72 09/10/2017 0746   CHOLHDL 2.6 12/13/2016 1632   CHOLHDL 3.0 09/15/2015 1525   VLDL 13 09/15/2015 1525   LDLCALC 79 09/10/2017 0746   Hepatic Function Panel     Component Value Date/Time   PROT  7.7 09/02/2017 1634   PROT 7.3 02/04/2017 1035   ALBUMIN 4.3 09/02/2017 1634   ALBUMIN 4.6 02/04/2017 1035   AST 31 09/02/2017 1634   ALT 20 09/02/2017 1634   ALKPHOS 79 09/02/2017 1634   BILITOT 0.8 09/02/2017 1634   BILITOT 0.4 02/04/2017 1035      Component Value Date/Time   TSH 1.620 02/04/2017 1035    ASSESSMENT AND PLAN: Type 2 diabetes mellitus without complication, without long-term current use of  insulin (H[facility])  Class 2 severe obesity with serious comorbidity and body mass index (BMI) of 39.0 to 39.9 in adult, unspecified obesity type ([place])  PLAN: Diabetes II Kasim has been given extensive diabetes education by myself today including ideal fasting and post-prandial blood glucose readings, individual ideal HgA1c goals  and hypoglycemia prevention. We discussed the importance of good blood sugar control to decrease the likelihood of diabetic complications such as nephropathy, neuropathy, limb loss, blindness, coronary artery disease, and death. We discussed the importance of intensive lifestyle modification including diet, exercise and weight loss as the first line treatment for diabetes. Matthew Garner agrees to continue his diabetes medications and will follow up at the agreed upon time.  I spent > than 50% of the 15 minute visit on counseling as documented in the note.  Obesity Matthew Garner is currently in the action stage of change. As such, his goal is to continue with weight loss efforts He has agreed to follow the Category 2 plan Matthew Garner has been instructed to work up to a goal of 150 minutes of combined cardio and strengthening exercise per week for weight loss and overall health benefits. We discussed the following Behavioral Modification Strategies today: work on meal planning and easy cooking plans and keeping healthy food in the home.    Matthew Garner has agreed to follow up with our clinic in 3 weeks. He was informed of the importance of frequent follow up visits to maximize his success  with intensive lifestyle modifications for his multiple health conditions.   OBESITY BEHAVIORAL INTERVENTION VISIT  Today's visit was # 13   Starting weight: 247 lb Starting date: 02/04/17 Today's weight : Weight: 229 lb (103.9 kg)  Today's date: 12/10/2017 Total lbs lost to date: 18 lb    ASK: We discussed the diagnosis of obesity with Matthew Garner today and Matthew Garner agreed to give [name] permission to discuss obesity behavioral modification therapy today.  ASSESS: [place] has the diagnosis of obesity and his BMI today is 39.29 Matthew Garner is in the action stage of change   ADVISE: Matthew Garner was educated on the multiple health risks of obesity as well as the benefit of weight loss to improve his health. He was advised of the need for long term treatment and the importance of lifestyle modifications to improve his current health and to decrease his risk of future health problems.  AGREE: Multiple dietary modification options and treatment options were discussed and  Matthew Garner agreed to follow the recommendations documented in the above note.  ARRANGE: Matthew Garner was educated on the importance of frequent visits to treat obesity as outlined per CMS and USPSTF guidelines and agreed to schedule his next follow up appointment today.  Matthew Garner, am acting as transcriptionist for Abby Potash, PA-C I, Abby Potash, PA-C have reviewed above note and agree with its content

## 2018-01-07 ENCOUNTER — Ambulatory Visit (INDEPENDENT_AMBULATORY_CARE_PROVIDER_SITE_OTHER): Payer: 59 | Admitting: Physician Assistant

## 2018-01-07 ENCOUNTER — Encounter (INDEPENDENT_AMBULATORY_CARE_PROVIDER_SITE_OTHER): Payer: Self-pay

## 2018-01-15 ENCOUNTER — Ambulatory Visit (INDEPENDENT_AMBULATORY_CARE_PROVIDER_SITE_OTHER): Payer: BLUE CROSS/BLUE SHIELD | Admitting: Physician Assistant

## 2018-01-15 ENCOUNTER — Encounter (INDEPENDENT_AMBULATORY_CARE_PROVIDER_SITE_OTHER): Payer: Self-pay | Admitting: Physician Assistant

## 2018-01-15 VITALS — BP 127/75 | HR 66 | Temp 98.3°F | Ht 64.0 in | Wt 235.0 lb

## 2018-01-15 DIAGNOSIS — Z6841 Body Mass Index (BMI) 40.0 and over, adult: Secondary | ICD-10-CM

## 2018-01-15 DIAGNOSIS — E1142 Type 2 diabetes mellitus with diabetic polyneuropathy: Secondary | ICD-10-CM

## 2018-01-16 NOTE — Progress Notes (Signed)
Office: [phone]  /  Fax: [phone]   HPI:   Chief Complaint: OBESITY Matthew Garner is here to discuss his progress with his obesity treatment plan. He is on the Category 2 plan and is following his eating plan approximately 90 % of the time. He states he is exercising 0 minutes 0 times per week. Matthew Garner reports that he has been eating whole avocados almost daily for his snacks. He notes a lot of uncontrolled hunger around 2:00 to 3:00 PM. His weight is 235 lb (106.6 kg) today and has had a weight loss of 6 pounds over a period of 5 weeks since his last visit. He has lost 12 lbs since starting treatment with [name].  Diabetes II Matthew Garner has a diagnosis of diabetes type II. Matthew Garner states fasting BGs range between 110 and 120 and he denies any hypoglycemic episodes or polyphagia. Matthew Garner is on metformin and he denies any nausea, vomiting or diarrhea. Last A1c was at 6.3 He has been working on intensive lifestyle modifications including diet, exercise, and weight loss to help control his blood glucose levels.   ASSESSMENT AND PLAN:  Type 2 diabetes mellitus with peripheral neuropathy (HCC)  Class 3 severe obesity with serious comorbidity and body mass index (BMI) of 40.0 to 44.9 in adult, unspecified obesity type ([place])  PLAN:  Diabetes II Matthew Garner has been given extensive diabetes education by myself today including ideal fasting and post-prandial blood glucose readings, individual ideal Hgb A1c goals and hypoglycemia prevention. We discussed the importance of good blood sugar control to decrease the likelihood of diabetic complications such as nephropathy, neuropathy, limb loss, blindness, coronary artery disease, and death. We discussed the importance of intensive lifestyle modification including diet, exercise and weight loss as the first line treatment for diabetes. Matthew Garner agrees to continue his diabetes medications and will follow up at the agreed upon time.  I spent > than 50% of the 15 minute visit  on counseling as documented in the note.  Obesity Matthew Garner is currently in the action stage of change. As such, his goal is to continue with weight loss efforts He has agreed to follow the Category 2 plan Matthew Garner has been instructed to work up to a goal of 150 minutes of combined cardio and strengthening exercise per week for weight loss and overall health benefits. We discussed the following Behavioral Modification Strategies today: keeping healthy foods in the home and work on meal planning and easy cooking plans  Matthew Garner has agreed to follow up with our clinic in 2 weeks. He was informed of the importance of frequent follow up visits to maximize his success with intensive lifestyle modifications for his multiple health conditions.  ALLERGIES: No Known Allergies  MEDICATIONS: Current Outpatient Medications on File Prior to Visit  Medication Sig Dispense Refill  . atorvastatin (LIPITOR) 10 MG tablet Take 1 tablet (10 mg total) by mouth daily at 6 PM. 90 tablet 1  . Cholecalciferol (VITAMIN D3) 5000 units TABS Take 1 tablet (5,000 Units total) by mouth daily at 12 noon. 30 tablet   . losartan (COZAAR) 50 MG tablet Take 1 tablet (50 mg total) by mouth every morning. 90 tablet 1  . metFORMIN (GLUCOPHAGE) 500 MG tablet Take 1 tablet (500 mg total) by mouth daily with breakfast. 90 tablet 1  . omeprazole (PRILOSEC) 20 MG capsule Take 1 capsule (20 mg total) by mouth daily. 30 capsule 0  . ranitidine (ZANTAC) 300 MG tablet TAKE 1 TABLET BY MOUTH AT  BEDTIME 90 tablet  0  . sildenafil (VIAGRA) 100 MG tablet TAKE 1/2 TABLET BY MOUTH DAILY AS NEEDED FOR ERECTILE DYSFUNCTION. 5 tablet 3   Current Facility-Administered Medications on File Prior to Visit  Medication Dose Route Frequency Provider Last Rate Last Dose  . 0.9 %  sodium chloride infusion  500 mL Intravenous Once Ladene Artist, MD        PAST MEDICAL HISTORY: Past Medical History:  Diagnosis Date  . Allergy    environmental  . Anemia     . Back pain   . Diabetes mellitus   . Environmental allergies 11/27/2011  . GERD (gastroesophageal reflux disease)   . Hyperlipidemia   . Hypertension   . Joint pain   . Knee pain, bilateral   . Sebaceous cyst 11/10/2012  . Snores     PAST SURGICAL HISTORY: Past Surgical History:  Procedure Laterality Date  . APPENDECTOMY    . KNEE SURGERY     right  . MASS EXCISION Bilateral 11/24/2012   Procedure: EXCISION SCALP MASS X2;  Surgeon: [name], MD;  Location: [facility];  Service: General;  Laterality: Bilateral;  . SPINE SURGERY  2004   lumb  . UPPER GASTROINTESTINAL ENDOSCOPY      SOCIAL HISTORY: Social History   Tobacco Use  . Smoking status: Never Smoker  . Smokeless tobacco: Never Used  Substance Use Topics  . Alcohol use: No  . Drug use: No    FAMILY HISTORY: Family History  Problem Relation Age of Onset  . Colon cancer Neg Hx   . Colon polyps Neg Hx     ROS: Review of Systems  Constitutional: Negative for weight loss.  Gastrointestinal: Negative for diarrhea, nausea and vomiting.  Endo/Heme/Allergies:       Negative for hypoglycemia Negative for polyphagia    PHYSICAL EXAM: Blood pressure 127/75, pulse 66, temperature 98.3 F (36.8 C), temperature source Oral, height 5\' 4"  (1.626 m), weight 235 lb (106.6 kg), SpO2 96 %. Body mass index is 40.34 kg/m. Physical Exam Vitals signs reviewed.  Constitutional:      Appearance: Normal appearance. He is well-developed. He is obese.  Cardiovascular:     Rate and Rhythm: Normal rate.  Pulmonary:     Effort: Pulmonary effort is normal.  Musculoskeletal: Normal range of motion.  Skin:    General: Skin is warm and dry.  Neurological:     Mental Status: He is alert and oriented to person, place, and time.  Psychiatric:        Mood and Affect: Mood normal.        Behavior: Behavior normal.     RECENT LABS AND TESTS: BMET    Component Value Date/Time   NA 140 09/02/2017  1634   NA 139 02/04/2017 1035   K 3.7 09/02/2017 1634   CL 100 09/02/2017 1634   CO2 21 (L) 09/02/2017 1634   GLUCOSE 179 (H) 09/02/2017 1634   BUN 22 (H) 09/02/2017 1634   BUN 12 02/04/2017 1035   CREATININE 1.02 09/02/2017 1634   CREATININE 0.76 09/15/2015 1525   CALCIUM 9.5 09/02/2017 1634   GFRNONAA >60 09/02/2017 1634   GFRNONAA >89 09/15/2015 1525   GFRAA >60 09/02/2017 1634   GFRAA >89 09/15/2015 1525   Lab Results  Component Value Date   HGBA1C 6.3 (H) 12/06/2017   HGBA1C 6.2 (H) 09/10/2017   HGBA1C 6.1 (H) 06/11/2017   HGBA1C 6.8 (H) 02/04/2017   HGBA1C 6.8 (H) 12/13/2016   Lab  Results  Component Value Date   INSULIN 5.7 09/10/2017   INSULIN 15.5 02/04/2017   CBC    Component Value Date/Time   WBC 10.8 (H) 09/02/2017 1634   RBC 5.25 09/02/2017 1634   HGB 15.8 09/02/2017 1634   HGB 16.0 02/04/2017 1035   HCT 45.4 09/02/2017 1634   HCT 46.1 02/04/2017 1035   PLT 323 09/02/2017 1634   MCV 86.5 09/02/2017 1634   MCV 88 02/04/2017 1035   MCH 30.1 09/02/2017 1634   MCHC 34.8 09/02/2017 1634   RDW 13.2 09/02/2017 1634   RDW 13.4 02/04/2017 1035   LYMPHSABS 1.5 02/04/2017 1035   MONOABS 0.7 08/20/2016 0926   EOSABS 0.1 02/04/2017 1035   BASOSABS 0.1 02/04/2017 1035   Iron/TIBC/Ferritin/ %Sat No results found for: IRON, TIBC, FERRITIN, IRONPCTSAT Lipid Panel     Component Value Date/Time   CHOL 161 09/10/2017 0746   TRIG 52 09/10/2017 0746   HDL 72 09/10/2017 0746   CHOLHDL 2.6 12/13/2016 1632   CHOLHDL 3.0 09/15/2015 1525   VLDL 13 09/15/2015 1525   LDLCALC 79 09/10/2017 0746   Hepatic Function Panel     Component Value Date/Time   PROT 7.7 09/02/2017 1634   PROT 7.3 02/04/2017 1035   ALBUMIN 4.3 09/02/2017 1634   ALBUMIN 4.6 02/04/2017 1035   AST 31 09/02/2017 1634   ALT 20 09/02/2017 1634   ALKPHOS 79 09/02/2017 1634   BILITOT 0.8 09/02/2017 1634   BILITOT 0.4 02/04/2017 1035      Component Value Date/Time   TSH 1.620 02/04/2017 1035      Ref. Range 09/10/2017 07:46  Vitamin D, 25-Hydroxy Latest Ref Range: 30.0 - 100.0 ng/mL 35.7     OBESITY BEHAVIORAL INTERVENTION VISIT  Today's visit was # 14   Starting weight: 247 lbs Starting date: 02/04/2017 Today's weight : 235 lbs  Today's date: 01/15/2018 Total lbs lost to date: 12   ASK: We discussed the diagnosis of obesity with Wallace Keller today and [name] agreed to give [name] permission to discuss obesity behavioral modification therapy today.  ASSESS: Orla has the diagnosis of obesity and his BMI today is 40.32 Sadler is in the action stage of change   ADVISE: Zadok was educated on the multiple health risks of obesity as well as the benefit of weight loss to improve his health. He was advised of the need for long term treatment and the importance of lifestyle modifications to improve his current health and to decrease his risk of future health problems.  AGREE: Multiple dietary modification options and treatment options were discussed and  Kimble agreed to follow the recommendations documented in the above note.  ARRANGE: June was educated on the importance of frequent visits to treat obesity as outlined per CMS and USPSTF guidelines and agreed to schedule his next follow up appointment today.  Corey Skains, am acting as transcriptionist for Abby Potash, PA-C I, Abby Potash, PA-C have reviewed above note and agree with its content

## 2018-01-29 ENCOUNTER — Encounter (INDEPENDENT_AMBULATORY_CARE_PROVIDER_SITE_OTHER): Payer: Self-pay | Admitting: Physician Assistant

## 2018-01-29 ENCOUNTER — Ambulatory Visit (INDEPENDENT_AMBULATORY_CARE_PROVIDER_SITE_OTHER): Payer: BLUE CROSS/BLUE SHIELD | Admitting: Physician Assistant

## 2018-01-29 VITALS — BP 145/84 | HR 58 | Temp 97.8°F | Ht 64.0 in | Wt 236.0 lb

## 2018-01-29 DIAGNOSIS — E559 Vitamin D deficiency, unspecified: Secondary | ICD-10-CM | POA: Diagnosis not present

## 2018-01-29 DIAGNOSIS — Z9189 Other specified personal risk factors, not elsewhere classified: Secondary | ICD-10-CM

## 2018-01-29 DIAGNOSIS — Z6841 Body Mass Index (BMI) 40.0 and over, adult: Secondary | ICD-10-CM

## 2018-01-29 DIAGNOSIS — E119 Type 2 diabetes mellitus without complications: Secondary | ICD-10-CM | POA: Diagnosis not present

## 2018-01-29 MED ORDER — SEMAGLUTIDE 3 MG PO TABS: 3.0000 mg | ORAL_TABLET | Freq: Every day | ORAL | 0 refills | Status: DC

## 2018-01-29 NOTE — Progress Notes (Signed)
Office: [phone]  /  Fax: [phone]   HPI:   Chief Complaint: OBESITY Matthew Garner is here to discuss his progress with his obesity treatment plan. He is on the Category 2 plan and is following his eating plan approximately 95 % of the time. He states he is exercising 0 minutes 0 times per week. Matthew Garner reports that  he followed the plan closely over the last 2 weeks. He is eating fruit for his snacks.  His weight is 236 lb (107 kg) today and has lost 0 lbs since his last visit. He has lost 11 lbs since starting treatment with [name].  Diabetes II Matthew Garner has a diagnosis of diabetes type II. He is currently taking metformin, he denies nausea vomiting or diarrhea. Matthew Garner reports polyphagia.  He denies any hypoglycemic episodes. Last A1c was Hemoglobin A1C Latest Ref Rng & Units 12/06/2017 09/10/2017 06/11/2017 02/04/2017  HGBA1C 4.8 - 5.6 % 6.3(H) 6.2(H) 6.1(H) 6.8(H)  Some recent data might be hidden   He has been working on intensive lifestyle modifications including diet, exercise, and weight loss to help control his blood glucose levels.  Vitamin D deficiency Matthew Garner has a diagnosis of vitamin D deficiency. He is currently taking OTC Vit D and denies nausea, vomiting or muscle weakness.  At risk for osteopenia and osteoporosis Matthew Garner is at higher risk of osteopenia and osteoporosis due to vitamin D deficiency.    ASSESSMENT AND PLAN:  Type 2 diabetes mellitus without complication, without long-term current use of insulin (HCC) - Plan: Semaglutide (RYBELSUS) 3 MG TABS  Vitamin D deficiency  At risk for osteoporosis  Class 3 severe obesity with serious comorbidity and body mass index (BMI) of 40.0 to 44.9 in adult, unspecified obesity type ([place])  PLAN:  Diabetes II Matthew Garner has been given extensive diabetes education by myself today including ideal fasting and post-prandial blood glucose readings, individual ideal Hgb A1c goals  and hypoglycemia prevention. We discussed the importance of good  blood sugar control to decrease the likelihood of diabetic complications such as nephropathy, neuropathy, limb loss, blindness, coronary artery disease, and death. We discussed the importance of intensive lifestyle modification including diet, exercise and weight loss as the first line treatment for diabetes. Matthew Garner agrees to start taking Rybelsus 3 mg 30 minutes prior to breakfast daily # 30 with no refills and will follow up with our clinic in 2 weeks.  Vitamin D Deficiency Matthew Garner was informed that low vitamin D levels contributes to fatigue and are associated with obesity, breast, and colon cancer. He agrees to continue to take OTC Vit D 5000 IU daily and will follow up for routine testing of vitamin D, at least 2-3 times per year. He was informed of the risk of over-replacement of vitamin D and agrees to not increase his dose unless he discusses this with [name] first. Matthew Garner agrees to follow up with our clinic in 2 weeks.  At risk for osteopenia and osteoporosis Matthew Garner was given extended  (15 minutes) osteoporosis prevention counseling today. Matthew Garner is at risk for osteopenia and osteoporosis due to his vitamin D deficiency. He was encouraged to take his vitamin D and follow his higher calcium diet and increase strengthening exercise to help strengthen his bones and decrease his risk of osteopenia and osteoporosis.  Obesity Matthew Garner is currently in the action stage of change. As such, his goal is to continue with weight loss efforts He has agreed to follow the Category 2 plan Matthew Garner has been instructed to work up to a  goal of 150 minutes of combined cardio and strengthening exercise per week for weight loss and overall health benefits. We discussed the following Behavioral Modification Strategies today: increasing lean protein intake and work on meal planning and easy cooking plans  We will check IC at the next visit. Matthew Garner has agreed to follow up with our clinic in 2 weeks. He was informed of the importance  of frequent follow up visits to maximize his success with intensive lifestyle modifications for his multiple health conditions.  ALLERGIES: No Known Allergies  MEDICATIONS: Current Outpatient Medications on File Prior to Visit  Medication Sig Dispense Refill  . atorvastatin (LIPITOR) 10 MG tablet Take 1 tablet (10 mg total) by mouth daily at 6 PM. 90 tablet 1  . Cholecalciferol (VITAMIN D3) 5000 units TABS Take 1 tablet (5,000 Units total) by mouth daily at 12 noon. 30 tablet   . losartan (COZAAR) 50 MG tablet Take 1 tablet (50 mg total) by mouth every morning. 90 tablet 1  . metFORMIN (GLUCOPHAGE) 500 MG tablet Take 1 tablet (500 mg total) by mouth daily with breakfast. 90 tablet 1  . omeprazole (PRILOSEC) 20 MG capsule Take 1 capsule (20 mg total) by mouth daily. 30 capsule 0  . ranitidine (ZANTAC) 300 MG tablet TAKE 1 TABLET BY MOUTH AT  BEDTIME 90 tablet 0  . sildenafil (VIAGRA) 100 MG tablet TAKE 1/2 TABLET BY MOUTH DAILY AS NEEDED FOR ERECTILE DYSFUNCTION. 5 tablet 3   Current Facility-Administered Medications on File Prior to Visit  Medication Dose Route Frequency Provider Last Rate Last Dose  . 0.9 %  sodium chloride infusion  500 mL Intravenous Once Ladene Artist, MD        PAST MEDICAL HISTORY: Past Medical History:  Diagnosis Date  . Allergy    environmental  . Anemia   . Back pain   . Diabetes mellitus   . Environmental allergies 11/27/2011  . GERD (gastroesophageal reflux disease)   . Hyperlipidemia   . Hypertension   . Joint pain   . Knee pain, bilateral   . Sebaceous cyst 11/10/2012  . Snores     PAST SURGICAL HISTORY: Past Surgical History:  Procedure Laterality Date  . APPENDECTOMY    . KNEE SURGERY     right  . MASS EXCISION Bilateral 11/24/2012   Procedure: EXCISION SCALP MASS X2;  Surgeon: [name], MD;  Location: [facility];  Service: General;  Laterality: Bilateral;  . SPINE SURGERY  2004   lumb  . UPPER  GASTROINTESTINAL ENDOSCOPY      SOCIAL HISTORY: Social History   Tobacco Use  . Smoking status: Never Smoker  . Smokeless tobacco: Never Used  Substance Use Topics  . Alcohol use: No  . Drug use: No    FAMILY HISTORY: Family History  Problem Relation Age of Onset  . Colon cancer Neg Hx   . Colon polyps Neg Hx     ROS: Review of Systems  Constitutional: Negative for weight loss.  Gastrointestinal: Negative for diarrhea, nausea and vomiting.  Musculoskeletal:       Negative for muscle weakness  Endo/Heme/Allergies:       Negative for hypoglycemia Positive for polyphagia    PHYSICAL EXAM: Blood pressure (!) 145/84, pulse (!) 58, temperature 97.8 F (36.6 C), temperature source Oral, height 5\' 4"  (1.626 m), weight 236 lb (107 kg), SpO2 97 %. Body mass index is 40.51 kg/m. Physical Exam Vitals signs reviewed.  Constitutional:  Appearance: Normal appearance. He is obese.  Cardiovascular:     Rate and Rhythm: Normal rate.  Pulmonary:     Effort: Pulmonary effort is normal.  Musculoskeletal: Normal range of motion.  Skin:    General: Skin is warm and dry.  Neurological:     Mental Status: He is alert and oriented to person, place, and time.  Psychiatric:        Mood and Affect: Mood normal.     RECENT LABS AND TESTS: BMET    Component Value Date/Time   NA 140 09/02/2017 1634   NA 139 02/04/2017 1035   K 3.7 09/02/2017 1634   CL 100 09/02/2017 1634   CO2 21 (L) 09/02/2017 1634   GLUCOSE 179 (H) 09/02/2017 1634   BUN 22 (H) 09/02/2017 1634   BUN 12 02/04/2017 1035   CREATININE 1.02 09/02/2017 1634   CREATININE 0.76 09/15/2015 1525   CALCIUM 9.5 09/02/2017 1634   GFRNONAA >60 09/02/2017 1634   GFRNONAA >89 09/15/2015 1525   GFRAA >60 09/02/2017 1634   GFRAA >89 09/15/2015 1525   Lab Results  Component Value Date   HGBA1C 6.3 (H) 12/06/2017   HGBA1C 6.2 (H) 09/10/2017   HGBA1C 6.1 (H) 06/11/2017   HGBA1C 6.8 (H) 02/04/2017   HGBA1C 6.8 (H)  12/13/2016   Lab Results  Component Value Date   INSULIN 5.7 09/10/2017   INSULIN 15.5 02/04/2017   CBC    Component Value Date/Time   WBC 10.8 (H) 09/02/2017 1634   RBC 5.25 09/02/2017 1634   HGB 15.8 09/02/2017 1634   HGB 16.0 02/04/2017 1035   HCT 45.4 09/02/2017 1634   HCT 46.1 02/04/2017 1035   PLT 323 09/02/2017 1634   MCV 86.5 09/02/2017 1634   MCV 88 02/04/2017 1035   MCH 30.1 09/02/2017 1634   MCHC 34.8 09/02/2017 1634   RDW 13.2 09/02/2017 1634   RDW 13.4 02/04/2017 1035   LYMPHSABS 1.5 02/04/2017 1035   MONOABS 0.7 08/20/2016 0926   EOSABS 0.1 02/04/2017 1035   BASOSABS 0.1 02/04/2017 1035   Iron/TIBC/Ferritin/ %Sat No results found for: IRON, TIBC, FERRITIN, IRONPCTSAT Lipid Panel     Component Value Date/Time   CHOL 161 09/10/2017 0746   TRIG 52 09/10/2017 0746   HDL 72 09/10/2017 0746   CHOLHDL 2.6 12/13/2016 1632   CHOLHDL 3.0 09/15/2015 1525   VLDL 13 09/15/2015 1525   LDLCALC 79 09/10/2017 0746   Hepatic Function Panel     Component Value Date/Time   PROT 7.7 09/02/2017 1634   PROT 7.3 02/04/2017 1035   ALBUMIN 4.3 09/02/2017 1634   ALBUMIN 4.6 02/04/2017 1035   AST 31 09/02/2017 1634   ALT 20 09/02/2017 1634   ALKPHOS 79 09/02/2017 1634   BILITOT 0.8 09/02/2017 1634   BILITOT 0.4 02/04/2017 1035      Component Value Date/Time   TSH 1.620 02/04/2017 1035     Ref. Range 09/10/2017 07:46  Vitamin D, 25-Hydroxy Latest Ref Range: 30.0 - 100.0 ng/mL 35.7     OBESITY BEHAVIORAL INTERVENTION VISIT  Today's visit was # 15   Starting weight: 247 lbs Starting date: 02/04/2017 Today's weight :: 236 lbs Today's date: 01/29/2018 Total lbs lost to date: 11   ASK: We discussed the diagnosis of obesity with Wallace Keller today and [name] agreed to give [name] permission to discuss obesity behavioral modification therapy today.  ASSESS: Blas has the diagnosis of obesity and his BMI today is 40.49 Jorden is in the action  stage of change    ADVISE: Jaquarious was educated on the multiple health risks of obesity as well as the benefit of weight loss to improve his health. He was advised of the need for long term treatment and the importance of lifestyle modifications to improve his current health and to decrease his risk of future health problems.  AGREE: Multiple dietary modification options and treatment options were discussed and  Leeland agreed to follow the recommendations documented in the above note.  ARRANGE: Ozil was educated on the importance of frequent visits to treat obesity as outlined per CMS and USPSTF guidelines and agreed to schedule his next follow up appointment today.  I, Tammy Wysor, am acting as Location manager for [employer] I, Abby Potash, PA-C have reviewed above note and agree with its content

## 2018-02-02 ENCOUNTER — Other Ambulatory Visit: Payer: Self-pay | Admitting: Family Medicine

## 2018-02-02 DIAGNOSIS — K219 Gastro-esophageal reflux disease without esophagitis: Secondary | ICD-10-CM

## 2018-02-03 ENCOUNTER — Telehealth (INDEPENDENT_AMBULATORY_CARE_PROVIDER_SITE_OTHER): Payer: Self-pay | Admitting: Physician Assistant

## 2018-02-06 NOTE — Telephone Encounter (Signed)
Spoke with the patient and informed him that the pharmacy will run the copay card for him and we sent his prescription in. Patient verbalized understanding. Matthew Garner, [place]

## 2018-02-17 ENCOUNTER — Ambulatory Visit (INDEPENDENT_AMBULATORY_CARE_PROVIDER_SITE_OTHER): Payer: BLUE CROSS/BLUE SHIELD | Admitting: Physician Assistant

## 2018-02-17 ENCOUNTER — Encounter (INDEPENDENT_AMBULATORY_CARE_PROVIDER_SITE_OTHER): Payer: Self-pay | Admitting: Physician Assistant

## 2018-02-17 VITALS — BP 123/73 | HR 54 | Temp 98.1°F | Ht 64.0 in | Wt 230.0 lb

## 2018-02-17 DIAGNOSIS — Z6839 Body mass index (BMI) 39.0-39.9, adult: Secondary | ICD-10-CM | POA: Diagnosis not present

## 2018-02-17 DIAGNOSIS — R0602 Shortness of breath: Secondary | ICD-10-CM

## 2018-02-17 DIAGNOSIS — Z9189 Other specified personal risk factors, not elsewhere classified: Secondary | ICD-10-CM | POA: Diagnosis not present

## 2018-02-17 DIAGNOSIS — E559 Vitamin D deficiency, unspecified: Secondary | ICD-10-CM | POA: Diagnosis not present

## 2018-02-17 DIAGNOSIS — E7849 Other hyperlipidemia: Secondary | ICD-10-CM | POA: Diagnosis not present

## 2018-02-17 NOTE — Progress Notes (Addendum)
Office: [phone]  /  Fax: [phone]   HPI:   Chief Complaint: OBESITY Matthew Garner is here to discuss his progress with his obesity treatment plan. He is on the Category 2 plan and is following his eating plan approximately 95% of the time. He states he is exercising 0 minutes 0 times per week. Matthew Garner reports that he has been following the plan closely. He is not getting in all of his protein on some days. His weight is 230 lb (104.3 kg) today and has had a weight loss of 6 pounds over a period of 3 weeks since his last visit. He has lost 17 lbs since starting treatment with [name].  Vitamin D deficiency Matthew Garner has a diagnosis of Vitamin D deficiency. He is currently taking Vit D and denies nausea, vomiting or muscle weakness.  Dyspnea with Exercise Matthew Garner notes increasing shortness of breath with exercising and seems to be worsening over time with weight gain. He notes getting out of breath sooner with activity than he used to. This has gotten worse recently. Matthew Garner denies shortness of breath at rest or orthopnea.  Hyperlipidemia Matthew Garner has hyperlipidemia and has been trying to improve his cholesterol levels with intensive lifestyle modification including a low saturated fat diet, exercise and weight loss. Matthew Garner is on atorvastatin and his last level was at goal. He denies any chest pain.  At risk for cardiovascular disease Matthew Garner is at a higher than average risk for cardiovascular disease due to obesity. He currently denies any chest pain.  ASSESSMENT AND PLAN:  Vitamin D deficiency - Plan: VITAMIN D 25 Hydroxy (Vit-D Deficiency, Fractures), Comprehensive metabolic panel  Other hyperlipidemia - Plan: Lipid Panel With LDL/HDL Ratio, Comprehensive metabolic panel  At risk for heart disease  Class 2 severe obesity with serious comorbidity and body mass index (BMI) of 39.0 to 39.9 in adult, unspecified obesity type ([place])  PLAN:  Vitamin D Deficiency Matthew Garner was informed that low Vitamin D  levels contributes to fatigue and are associated with obesity, breast, and colon cancer. He agrees to continue taking Vit D and will have labs drawn today. Matthew Garner was informed of the risk of over-replacement of Vitamin D and agrees to not increase his dose unless he discusses this with [name] first. He agrees to follow-up with our clinic in 2 weeks. We will check labs today.  Dyspnea with exercise Matthew Garner's shortness of breath appears to be obesity related and exercise induced. The indirect calorimeter results showed VO2 of 13% and a REE of 1118. He has agreed to work on weight loss and gradually increase exercise to treat his exercise induced shortness of breath. If Jamarkis follows our instructions and loses weight without improvement of his shortness of breath, we will plan to refer to pulmonology. Matthew Garner agrees to this plan.  Hyperlipidemia Matthew Garner was informed of the American Heart Association Guidelines emphasizing intensive lifestyle modifications as the first line treatment for hyperlipidemia. We discussed many lifestyle modifications today in depth, and Zaydn will continue to work on decreasing saturated fats such as fatty red meat, butter and many fried foods. He will also increase vegetables and lean protein in his diet and continue to work on exercise and weight loss efforts.  Cardiovascular risk counseling Matthew Garner was given extended (15 minutes) coronary artery disease prevention counseling today. He is 58 y.o. male and has risk factors for heart disease including obesity. We discussed intensive lifestyle modifications today with an emphasis on specific weight loss instructions and strategies. Pt was also informed of  the importance of increasing exercise and decreasing saturated fats to help prevent heart disease.  Obesity Matthew Garner is currently in the action stage of change. As such, his goal is to continue with weight loss efforts. He has agreed to follow the Category 2 plan. Matthew Garner has been instructed to  work up to a goal of 150 minutes of combined cardio and strengthening exercise per week for weight loss and overall health benefits. We discussed the following Behavioral Modification Strategies today: increasing lean protein intake and work on meal planning and easy cooking plans.  Matthew Garner has agreed to follow-up with our clinic in 3 weeks. He was informed of the importance of frequent follow up visits to maximize his success with intensive lifestyle modifications for his multiple health conditions.  ALLERGIES: No Known Allergies  MEDICATIONS: Current Outpatient Medications on File Prior to Visit  Medication Sig Dispense Refill  . atorvastatin (LIPITOR) 10 MG tablet Take 1 tablet (10 mg total) by mouth daily at 6 PM. 90 tablet 1  . Cholecalciferol (VITAMIN D3) 5000 units TABS Take 1 tablet (5,000 Units total) by mouth daily at 12 noon. 30 tablet   . losartan (COZAAR) 50 MG tablet Take 1 tablet (50 mg total) by mouth every morning. 90 tablet 1  . metFORMIN (GLUCOPHAGE) 500 MG tablet Take 1 tablet (500 mg total) by mouth daily with breakfast. 90 tablet 1  . omeprazole (PRILOSEC) 20 MG capsule Take 1 capsule (20 mg total) by mouth daily. 30 capsule 0  . ranitidine (ZANTAC) 300 MG tablet TAKE 1 TABLET BY MOUTH AT  BEDTIME 90 tablet 0  . Semaglutide (RYBELSUS) 3 MG TABS Take 3 mg by mouth daily. 30 tablet 0  . sildenafil (VIAGRA) 100 MG tablet TAKE 1/2 TABLET BY MOUTH DAILY AS NEEDED FOR ERECTILE DYSFUNCTION. 5 tablet 3   Current Facility-Administered Medications on File Prior to Visit  Medication Dose Route Frequency Provider Last Rate Last Dose  . 0.9 %  sodium chloride infusion  500 mL Intravenous Once Ladene Artist, MD        PAST MEDICAL HISTORY: Past Medical History:  Diagnosis Date  . Allergy    environmental  . Anemia   . Back pain   . Diabetes mellitus   . Environmental allergies 11/27/2011  . GERD (gastroesophageal reflux disease)   . Hyperlipidemia   . Hypertension   .  Joint pain   . Knee pain, bilateral   . Sebaceous cyst 11/10/2012  . Snores     PAST SURGICAL HISTORY: Past Surgical History:  Procedure Laterality Date  . APPENDECTOMY    . KNEE SURGERY     right  . MASS EXCISION Bilateral 11/24/2012   Procedure: EXCISION SCALP MASS X2;  Surgeon: [name], MD;  Location: [facility];  Service: General;  Laterality: Bilateral;  . SPINE SURGERY  2004   lumb  . UPPER GASTROINTESTINAL ENDOSCOPY      SOCIAL HISTORY: Social History   Tobacco Use  . Smoking status: Never Smoker  . Smokeless tobacco: Never Used  Substance Use Topics  . Alcohol use: No  . Drug use: No    FAMILY HISTORY: Family History  Problem Relation Age of Onset  . Colon cancer Neg Hx   . Colon polyps Neg Hx    ROS: Review of Systems  Constitutional: Positive for weight loss.  Cardiovascular: Negative for chest pain.  Gastrointestinal: Negative for nausea and vomiting.  Musculoskeletal:       Negative for muscle  weakness.  Endo/Heme/Allergies:       Negative for hypoglycemia.   PHYSICAL EXAM: Blood pressure 123/73, pulse (!) 54, temperature 98.1 F (36.7 C), temperature source Oral, height 5\' 4"  (1.626 m), weight 230 lb (104.3 kg), SpO2 100 %. Body mass index is 39.48 kg/m. Physical Exam Vitals signs reviewed.  Constitutional:      Appearance: Normal appearance. He is obese.  Cardiovascular:     Rate and Rhythm: Normal rate.     Pulses: Normal pulses.  Pulmonary:     Effort: Pulmonary effort is normal.     Breath sounds: Normal breath sounds.  Musculoskeletal: Normal range of motion.  Skin:    General: Skin is warm and dry.  Neurological:     Mental Status: He is alert and oriented to person, place, and time.  Psychiatric:        Behavior: Behavior normal.   RECENT LABS AND TESTS: BMET    Component Value Date/Time   NA 140 09/02/2017 1634   NA 139 02/04/2017 1035   K 3.7 09/02/2017 1634   CL 100 09/02/2017 1634   CO2 21  (L) 09/02/2017 1634   GLUCOSE 179 (H) 09/02/2017 1634   BUN 22 (H) 09/02/2017 1634   BUN 12 02/04/2017 1035   CREATININE 1.02 09/02/2017 1634   CREATININE 0.76 09/15/2015 1525   CALCIUM 9.5 09/02/2017 1634   GFRNONAA >60 09/02/2017 1634   GFRNONAA >89 09/15/2015 1525   GFRAA >60 09/02/2017 1634   GFRAA >89 09/15/2015 1525   Lab Results  Component Value Date   HGBA1C 6.3 (H) 12/06/2017   HGBA1C 6.2 (H) 09/10/2017   HGBA1C 6.1 (H) 06/11/2017   HGBA1C 6.8 (H) 02/04/2017   HGBA1C 6.8 (H) 12/13/2016   Lab Results  Component Value Date   INSULIN 5.7 09/10/2017   INSULIN 15.5 02/04/2017   CBC    Component Value Date/Time   WBC 10.8 (H) 09/02/2017 1634   RBC 5.25 09/02/2017 1634   HGB 15.8 09/02/2017 1634   HGB 16.0 02/04/2017 1035   HCT 45.4 09/02/2017 1634   HCT 46.1 02/04/2017 1035   PLT 323 09/02/2017 1634   MCV 86.5 09/02/2017 1634   MCV 88 02/04/2017 1035   MCH 30.1 09/02/2017 1634   MCHC 34.8 09/02/2017 1634   RDW 13.2 09/02/2017 1634   RDW 13.4 02/04/2017 1035   LYMPHSABS 1.5 02/04/2017 1035   MONOABS 0.7 08/20/2016 0926   EOSABS 0.1 02/04/2017 1035   BASOSABS 0.1 02/04/2017 1035   Iron/TIBC/Ferritin/ %Sat No results found for: IRON, TIBC, FERRITIN, IRONPCTSAT Lipid Panel     Component Value Date/Time   CHOL 161 09/10/2017 0746   TRIG 52 09/10/2017 0746   HDL 72 09/10/2017 0746   CHOLHDL 2.6 12/13/2016 1632   CHOLHDL 3.0 09/15/2015 1525   VLDL 13 09/15/2015 1525   LDLCALC 79 09/10/2017 0746   Hepatic Function Panel     Component Value Date/Time   PROT 7.7 09/02/2017 1634   PROT 7.3 02/04/2017 1035   ALBUMIN 4.3 09/02/2017 1634   ALBUMIN 4.6 02/04/2017 1035   AST 31 09/02/2017 1634   ALT 20 09/02/2017 1634   ALKPHOS 79 09/02/2017 1634   BILITOT 0.8 09/02/2017 1634   BILITOT 0.4 02/04/2017 1035      Component Value Date/Time   TSH 1.620 02/04/2017 1035    Ref. Range 09/10/2017 07:46  Vitamin D, 25-Hydroxy Latest Ref Range: 30.0 - 100.0 ng/mL  35.7   OBESITY BEHAVIORAL INTERVENTION VISIT  Today's visit was #  16  Starting weight: 247 lbs Starting date: 02/04/2017 Today's weight: 230 lbs  Today's date: 02/17/2018 Total lbs lost to date: 17  ASK: We discussed the diagnosis of obesity with Matthew Garner today and Matthew Garner agreed to give [name] permission to discuss obesity behavioral modification therapy today.  ASSESS: Matthew Garner has the diagnosis of obesity and his BMI today is 39.48. Javante is in the action stage of change.  ADVISE: Orlin was educated on the multiple health risks of obesity as well as the benefit of weight loss to improve his health. He was advised of the need for long term treatment and the importance of lifestyle modifications to improve his current health and to decrease his risk of future health problems.  AGREE: Multiple dietary modification options and treatment options were discussed and  Jedidiah agreed to follow the recommendations documented in the above note.  ARRANGE: Laron was educated on the importance of frequent visits to treat obesity as outlined per CMS and USPSTF guidelines and agreed to schedule his next follow up appointment today.  Migdalia Dk, am acting as transcriptionist for Abby Potash, PA-C I, Abby Potash, PA-C have reviewed above note and agree with its content

## 2018-02-18 LAB — COMPREHENSIVE METABOLIC PANEL
A/G RATIO: 1.8 (ref 1.2–2.2)
ALBUMIN: 4.8 g/dL (ref 3.8–4.9)
ALT: 19 IU/L (ref 0–44)
AST: 17 IU/L (ref 0–40)
Alkaline Phosphatase: 79 IU/L (ref 39–117)
BUN / CREAT RATIO: 23 — AB (ref 9–20)
BUN: 18 mg/dL (ref 6–24)
Bilirubin Total: 0.4 mg/dL (ref 0.0–1.2)
CALCIUM: 9.7 mg/dL (ref 8.7–10.2)
CO2: 23 mmol/L (ref 20–29)
Chloride: 99 mmol/L (ref 96–106)
Creatinine, Ser: 0.77 mg/dL (ref 0.76–1.27)
GFR, EST AFRICAN AMERICAN: 118 mL/min/{1.73_m2} (ref >59)
GFR, EST NON AFRICAN AMERICAN: 102 mL/min/{1.73_m2} (ref >59)
Globulin, Total: 2.7 g/dL (ref 1.5–4.5)
Glucose: 104 mg/dL — ABNORMAL HIGH (ref 65–99)
Potassium: 4.5 mmol/L (ref 3.5–5.2)
Sodium: 140 mmol/L (ref 134–144)
TOTAL PROTEIN: 7.5 g/dL (ref 6.0–8.5)

## 2018-02-18 LAB — LIPID PANEL WITH LDL/HDL RATIO
CHOLESTEROL TOTAL: 167 mg/dL (ref 100–199)
HDL: 65 mg/dL (ref >39)
LDL Calculated: 89 mg/dL (ref 0–99)
LDl/HDL Ratio: 1.4 ratio (ref 0.0–3.6)
Triglycerides: 65 mg/dL (ref 0–149)
VLDL CHOLESTEROL CAL: 13 mg/dL (ref 5–40)

## 2018-02-18 LAB — VITAMIN D 25 HYDROXY (VIT D DEFICIENCY, FRACTURES): VIT D 25 HYDROXY: 32.5 ng/mL (ref 30.0–100.0)

## 2018-03-12 ENCOUNTER — Encounter (INDEPENDENT_AMBULATORY_CARE_PROVIDER_SITE_OTHER): Payer: Self-pay | Admitting: Physician Assistant

## 2018-03-12 ENCOUNTER — Other Ambulatory Visit: Payer: Self-pay

## 2018-03-12 ENCOUNTER — Ambulatory Visit (INDEPENDENT_AMBULATORY_CARE_PROVIDER_SITE_OTHER): Payer: BLUE CROSS/BLUE SHIELD | Admitting: Physician Assistant

## 2018-03-12 VITALS — BP 132/79 | HR 64 | Temp 98.2°F | Ht 64.0 in | Wt 235.0 lb

## 2018-03-12 DIAGNOSIS — K219 Gastro-esophageal reflux disease without esophagitis: Secondary | ICD-10-CM | POA: Diagnosis not present

## 2018-03-12 DIAGNOSIS — I10 Essential (primary) hypertension: Secondary | ICD-10-CM | POA: Diagnosis not present

## 2018-03-12 DIAGNOSIS — Z9189 Other specified personal risk factors, not elsewhere classified: Secondary | ICD-10-CM | POA: Diagnosis not present

## 2018-03-12 DIAGNOSIS — Z6841 Body Mass Index (BMI) 40.0 and over, adult: Secondary | ICD-10-CM

## 2018-03-12 DIAGNOSIS — E119 Type 2 diabetes mellitus without complications: Secondary | ICD-10-CM | POA: Diagnosis not present

## 2018-03-12 MED ORDER — METFORMIN HCL 500 MG PO TABS: 500.0000 mg | ORAL_TABLET | Freq: Every day | ORAL | 0 refills | Status: DC

## 2018-03-12 MED ORDER — RANITIDINE HCL 300 MG PO TABS: 300.0000 mg | ORAL_TABLET | Freq: Every day | ORAL | 3 refills | Status: DC

## 2018-03-12 MED ORDER — LOSARTAN POTASSIUM 50 MG PO TABS: 50.0000 mg | ORAL_TABLET | Freq: Every morning | ORAL | 0 refills | Status: DC

## 2018-03-13 NOTE — Progress Notes (Signed)
Office: [phone]  /  Fax: [phone]   HPI:   Chief Complaint: OBESITY Matthew Garner is here to discuss his progress with his obesity treatment plan. He is on the Category 2 plan and is following his eating plan approximately 93% of the time. He states he is exercising 0 minutes 0 times per week. Matthew Garner reports that he has been attempting to eat close to plan. He is overeating his snack calories. He uses creamer for all of his snack calories and then eats additional calories. His weight is 235 lb (106.6 kg) today and has had a weight gain of 5 lbs since his last visit. He has lost 12 lbs since starting treatment with [name].  Vitamin D deficiency Matthew Garner has a diagnosis of Vitamin D deficiency. He is currently taking OTC Vit D and denies nausea, vomiting or muscle weakness.  At risk for osteopenia and osteoporosis Matthew Garner is at higher risk of osteopenia and osteoporosis due to Vitamin D deficiency.   Diabetes II Matthew Garner has a diagnosis of diabetes type II and is taking metformin and Rybelsus but reports only taking Rybelsus one day since his last visit. Matthew Garner does not report checking his sugars. Last A1c was 6.3 on 12/06/2017. He has been working on intensive lifestyle modifications including diet, exercise, and weight loss to help control his blood glucose levels. He denies nausea, vomiting, diarrhea, or polyphagia.  Hypertension Matthew Garner is a 58 y.o. male with hypertension and is on losartan.  Matthew Garner denies chest pain. He is working weight loss to help control his blood pressure with the goal of decreasing his risk of heart attack and stroke. Matthew Garner's blood pressure is currently controlled.  Gastroesophageal Reflux Disease (GERD) Matthew Garner states he is on ranitidine and currently has no symptoms.  ASSESSMENT AND PLAN:  Essential hypertension - Plan: losartan (COZAAR) 50 MG tablet  Gastroesophageal reflux disease, esophagitis presence not specified - Plan: ranitidine (ZANTAC) 300 MG tablet   Type 2 diabetes mellitus without complication, without long-term current use of insulin ([facility]) - Plan: ranitidine (ZANTAC) 300 MG tablet, metFORMIN (GLUCOPHAGE) 500 MG tablet  Class 3 severe obesity with serious comorbidity and body mass index (BMI) of 40.0 to 44.9 in adult, unspecified obesity type ([place])  PLAN:  Vitamin D Deficiency Matthew Garner was informed that low Vitamin D levels contributes to fatigue and are associated with obesity, breast, and colon cancer. He will discontinue his current dose of Vitamin D and change to OTC Vitamin D 5,000 units 2 capsules 5 days per week and will follow-up for routine testing of Vitamin D, at least 2-3 times per year. He was informed of the risk of over-replacement of Vitamin D and agrees to not increase his dose unless he discusses this with [name] first. Matthew Garner agrees to follow-up with our clinic in 4 weeks.  At risk for osteopenia and osteoporosis Matthew Garner was given extended  (15 minutes) osteoporosis prevention counseling today. Matthew Garner is at risk for osteopenia and osteoporsis due to his Vitamin D deficiency. He was encouraged to take his Vitamin D and follow his higher calcium diet and increase strengthening exercise to help strengthen his bones and decrease his risk of osteopenia and osteoporosis.  Diabetes II Matthew Garner has been given extensive diabetes education by myself today including ideal fasting and post-prandial blood glucose readings, individual ideal Hgb A1c goals  and hypoglycemia prevention. We discussed the importance of good blood sugar control to decrease the likelihood of diabetic complications such as nephropathy, neuropathy, limb loss, blindness, coronary artery disease,  and death. We discussed the importance of intensive lifestyle modification including diet, exercise and weight loss as the first line treatment for diabetes. Matthew Garner was given a refill RX for metformin #30 with 0 refills and agrees to follow-up with our clinic in 4 weeks.  Hypertension We  discussed sodium restriction, working on healthy weight loss, and a regular exercise program as the means to achieve improved blood pressure control. Matthew Garner agreed with this plan and agreed to follow up as directed. We will continue to monitor his blood pressure as well as his progress with the above lifestyle modifications. He was given a refill RX for Losartan #30 with 0 refills and agrees to follow-up with our clinic in 4 weeks. He will watch for signs of hypotension as he continues his lifestyle modifications.  Gastroesophageal Reflux Disease (GERD) Matthew Garner was given a refill RX for ranitidine #30 with 3 refills. He agrees to follow-up with our clinic in 4 weeks.  Obesity Matthew Garner is currently in the action stage of change. As such, his goal is to continue with weight loss efforts. He has agreed to follow the Category 2 plan. He will meet with our Registered Dietitian in 2 weeks to ensure he is following his plan. Matthew Garner has been instructed to work up to a goal of 150 minutes of combined cardio and strengthening exercise per week for weight loss and overall health benefits. We discussed the following Behavioral Modification Strategies today: increasing lean protein intake and work on meal planning and easy cooking plans  Matthew Garner has agreed to follow-up with our clinic in 4 weeks and will see our Registered Dietitian in 2 weeks. He was informed of the importance of frequent follow up visits to maximize his success with intensive lifestyle modifications for his multiple health conditions.  ALLERGIES: No Known Allergies  MEDICATIONS: Current Outpatient Medications on File Prior to Visit  Medication Sig Dispense Refill  . atorvastatin (LIPITOR) 10 MG tablet Take 1 tablet (10 mg total) by mouth daily at 6 PM. 90 tablet 1  . Cholecalciferol (VITAMIN D3) 5000 units TABS Take 1 tablet (5,000 Units total) by mouth daily at 12 noon. 30 tablet   . omeprazole (PRILOSEC) 20 MG capsule Take 1 capsule (20 mg  total) by mouth daily. 30 capsule 0  . Semaglutide (RYBELSUS) 3 MG TABS Take 3 mg by mouth daily. 30 tablet 0  . sildenafil (VIAGRA) 100 MG tablet TAKE 1/2 TABLET BY MOUTH DAILY AS NEEDED FOR ERECTILE DYSFUNCTION. 5 tablet 3   Current Facility-Administered Medications on File Prior to Visit  Medication Dose Route Frequency Provider Last Rate Last Dose  . 0.9 %  sodium chloride infusion  500 mL Intravenous Once Ladene Artist, MD        PAST MEDICAL HISTORY: Past Medical History:  Diagnosis Date  . Allergy    environmental  . Anemia   . Back pain   . Diabetes mellitus   . Environmental allergies 11/27/2011  . GERD (gastroesophageal reflux disease)   . Hyperlipidemia   . Hypertension   . Joint pain   . Knee pain, bilateral   . Sebaceous cyst 11/10/2012  . Snores     PAST SURGICAL HISTORY: Past Surgical History:  Procedure Laterality Date  . APPENDECTOMY    . KNEE SURGERY     right  . MASS EXCISION Bilateral 11/24/2012   Procedure: EXCISION SCALP MASS X2;  Surgeon: [name], MD;  Location: [facility];  Service: General;  Laterality: Bilateral;  .  SPINE SURGERY  2004   lumb  . UPPER GASTROINTESTINAL ENDOSCOPY      SOCIAL HISTORY: Social History   Tobacco Use  . Smoking status: Never Smoker  . Smokeless tobacco: Never Used  Substance Use Topics  . Alcohol use: No  . Drug use: No    FAMILY HISTORY: Family History  Problem Relation Age of Onset  . Colon cancer Neg Hx   . Colon polyps Neg Hx    ROS: Review of Systems  Constitutional: Negative for weight loss.  Cardiovascular: Negative for chest pain.  Gastrointestinal: Negative for diarrhea, nausea and vomiting.       Positive for GERD.  Musculoskeletal:       Negative for muscle weakness.  Endo/Heme/Allergies:       Negative for polyphagia. Negative for hypoglycemia.   PHYSICAL EXAM: Blood pressure 132/79, pulse 64, temperature 98.2 F (36.8 C), temperature source Oral, height  5\' 4"  (1.626 m), weight 235 lb (106.6 kg), SpO2 99 %. Body mass index is 40.34 kg/m. Physical Exam Vitals signs reviewed.  Constitutional:      Appearance: Normal appearance. He is obese.  Cardiovascular:     Rate and Rhythm: Normal rate.     Pulses: Normal pulses.  Pulmonary:     Effort: Pulmonary effort is normal.     Breath sounds: Normal breath sounds.  Musculoskeletal: Normal range of motion.  Skin:    General: Skin is warm and dry.  Neurological:     Mental Status: He is alert and oriented to person, place, and time.  Psychiatric:        Behavior: Behavior normal.   RECENT LABS AND TESTS: BMET    Component Value Date/Time   NA 140 02/17/2018 1318   K 4.5 02/17/2018 1318   CL 99 02/17/2018 1318   CO2 23 02/17/2018 1318   GLUCOSE 104 (H) 02/17/2018 1318   GLUCOSE 179 (H) 09/02/2017 1634   BUN 18 02/17/2018 1318   CREATININE 0.77 02/17/2018 1318   CREATININE 0.76 09/15/2015 1525   CALCIUM 9.7 02/17/2018 1318   GFRNONAA 102 02/17/2018 1318   GFRNONAA >89 09/15/2015 1525   GFRAA 118 02/17/2018 1318   GFRAA >89 09/15/2015 1525   Lab Results  Component Value Date   HGBA1C 6.3 (H) 12/06/2017   HGBA1C 6.2 (H) 09/10/2017   HGBA1C 6.1 (H) 06/11/2017   HGBA1C 6.8 (H) 02/04/2017   HGBA1C 6.8 (H) 12/13/2016   Lab Results  Component Value Date   INSULIN 5.7 09/10/2017   INSULIN 15.5 02/04/2017   CBC    Component Value Date/Time   WBC 10.8 (H) 09/02/2017 1634   RBC 5.25 09/02/2017 1634   HGB 15.8 09/02/2017 1634   HGB 16.0 02/04/2017 1035   HCT 45.4 09/02/2017 1634   HCT 46.1 02/04/2017 1035   PLT 323 09/02/2017 1634   MCV 86.5 09/02/2017 1634   MCV 88 02/04/2017 1035   MCH 30.1 09/02/2017 1634   MCHC 34.8 09/02/2017 1634   RDW 13.2 09/02/2017 1634   RDW 13.4 02/04/2017 1035   LYMPHSABS 1.5 02/04/2017 1035   MONOABS 0.7 08/20/2016 0926   EOSABS 0.1 02/04/2017 1035   BASOSABS 0.1 02/04/2017 1035   Iron/TIBC/Ferritin/ %Sat No results found for: IRON,  TIBC, FERRITIN, IRONPCTSAT Lipid Panel     Component Value Date/Time   CHOL 167 02/17/2018 1318   TRIG 65 02/17/2018 1318   HDL 65 02/17/2018 1318   CHOLHDL 2.6 12/13/2016 1632   CHOLHDL 3.0 09/15/2015 1525  VLDL 13 09/15/2015 1525   LDLCALC 89 02/17/2018 1318   Hepatic Function Panel     Component Value Date/Time   PROT 7.5 02/17/2018 1318   ALBUMIN 4.8 02/17/2018 1318   AST 17 02/17/2018 1318   ALT 19 02/17/2018 1318   ALKPHOS 79 02/17/2018 1318   BILITOT 0.4 02/17/2018 1318      Component Value Date/Time   TSH 1.620 02/04/2017 1035   Results for GORDY, GOAR (MRN [id number]) as of 03/13/2018 07:14  Ref. Range 02/17/2018 13:18  Vitamin D, 25-Hydroxy Latest Ref Range: 30.0 - 100.0 ng/mL 32.5   OBESITY BEHAVIORAL INTERVENTION VISIT  Today's visit was #17  Starting weight: 247 lbs Starting date: 02/04/2017 Today's weight: 235 lbs Today's date: 03/12/2018 Total lbs lost to date: 12    03/12/2018  Height 5\' 4"  (1.626 m)  Weight 235 lb (106.6 kg)  BMI (Calculated) 40.32  BLOOD PRESSURE - SYSTOLIC 263  BLOOD PRESSURE - DIASTOLIC 79   Body Fat % 33.5 %  Total Body Water (lbs) 114.4 lbs   ASK: We discussed the diagnosis of obesity with Matthew Garner today and Matthew Garner agreed to give [name] permission to discuss obesity behavioral modification therapy today.  ASSESS: Matthew Garner has the diagnosis of obesity and his BMI today is 40.32. Matthew Garner is in the action stage of change.   ADVISE: Matthew Garner was educated on the multiple health risks of obesity as well as the benefit of weight loss to improve his health. He was advised of the need for long term treatment and the importance of lifestyle modifications to improve his current health and to decrease his risk of future health problems.  AGREE: Multiple dietary modification options and treatment options were discussed and  Matthew Garner agreed to follow the recommendations documented in the above note.  ARRANGE: Matthew Garner was educated on the importance  of frequent visits to treat obesity as outlined per CMS and USPSTF guidelines and agreed to schedule his next follow up appointment today.  Migdalia Dk, am acting as transcriptionist for Abby Potash, PA-C I, Abby Potash, PA-C have reviewed above note and agree with its content

## 2018-03-18 ENCOUNTER — Encounter (INDEPENDENT_AMBULATORY_CARE_PROVIDER_SITE_OTHER): Payer: Self-pay

## 2018-03-26 ENCOUNTER — Encounter (INDEPENDENT_AMBULATORY_CARE_PROVIDER_SITE_OTHER): Payer: Self-pay

## 2018-03-31 ENCOUNTER — Ambulatory Visit (INDEPENDENT_AMBULATORY_CARE_PROVIDER_SITE_OTHER): Payer: BLUE CROSS/BLUE SHIELD | Admitting: Dietician

## 2018-04-09 ENCOUNTER — Encounter (INDEPENDENT_AMBULATORY_CARE_PROVIDER_SITE_OTHER): Payer: Self-pay | Admitting: Physician Assistant

## 2018-04-09 ENCOUNTER — Other Ambulatory Visit: Payer: Self-pay

## 2018-04-09 ENCOUNTER — Ambulatory Visit (INDEPENDENT_AMBULATORY_CARE_PROVIDER_SITE_OTHER): Payer: BLUE CROSS/BLUE SHIELD | Admitting: Physician Assistant

## 2018-04-09 DIAGNOSIS — E119 Type 2 diabetes mellitus without complications: Secondary | ICD-10-CM

## 2018-04-09 DIAGNOSIS — Z6841 Body Mass Index (BMI) 40.0 and over, adult: Secondary | ICD-10-CM | POA: Diagnosis not present

## 2018-04-09 DIAGNOSIS — E66813 Obesity, class 3: Secondary | ICD-10-CM

## 2018-04-09 DIAGNOSIS — E7849 Other hyperlipidemia: Secondary | ICD-10-CM | POA: Diagnosis not present

## 2018-04-09 MED ORDER — SEMAGLUTIDE 7 MG PO TABS: 7.0000 mg | ORAL_TABLET | Freq: Every day | ORAL | 0 refills | Status: DC

## 2018-04-09 NOTE — Progress Notes (Signed)
Office: [phone]  /  Fax: [phone] TeleHealth Visit:  Matthew Garner has verbally consented to this TeleHealth visit today. The patient is located at home, the provider is located at the [facility] office. The participants in this visit include the listed provider and patient. The visit was conducted today via FaceTime.  HPI:   Chief Complaint: OBESITY Matthew Garner is here to discuss his progress with his obesity treatment plan. He is on the Category 2 plan and is following his eating plan approximately 90% of the time. He states he is exercising 0 minutes 0 times per week. [name] reports that he has been furloughed from his job and is under a lot of stress. He states his appetite has decreased and he hasn't been eating all of the food on the plan. We were unable to weigh the patient today for this TeleHealth visit. He states he is unsure if he has lost or gained weight since his last visit. He has lost 12 lbs since starting treatment with [name].  Diabetes II Matthew Garner has a diagnosis of diabetes type II and is on Rybelsus and metformin. Matthew Garner states fasting blood sugars are in the range of 100 and 110's. Last A1c was reported to be 6.3 on 12/06/2017. He has been working on intensive lifestyle modifications including diet, exercise, and weight loss to help control his blood glucose levels. He denies nausea, vomiting, or muscle weakness.  Hyperlipidemia Matthew Garner has hyperlipidemia and is on atorvastatin. He has been trying to improve his cholesterol levels with intensive lifestyle modification including a low saturated fat diet, exercise and weight loss. He denies any chest pain.  ASSESSMENT AND PLAN:  Type 2 diabetes mellitus without complication, without long-term current use of insulin (HCC) - Plan: Semaglutide (RYBELSUS) 7 MG TABS  Other hyperlipidemia  Class 3 severe obesity with serious comorbidity and body mass index (BMI) of 40.0 to 44.9 in adult, unspecified obesity type ([name])   PLAN:  Diabetes II Matthew Garner has been given extensive diabetes education by myself today including ideal fasting and post-prandial blood glucose readings, individual ideal HgA1c goals  and hypoglycemia prevention. We discussed the importance of good blood sugar control to decrease the likelihood of diabetic complications such as nephropathy, neuropathy, limb loss, blindness, coronary artery disease, and death. We discussed the importance of intensive lifestyle modification including diet, exercise and weight loss as the first line treatment for diabetes. [name]'s Rybelsus dose was increased to 7 mg q PO QAM #30 with no refills. He agrees to follow-up with our clinic in 2 weeks.  Hyperlipidemia Matthew Garner was informed of the American Heart Association Guidelines emphasizing intensive lifestyle modifications as the first line treatment for hyperlipidemia. We discussed many lifestyle modifications today in depth, and [name] will continue to work on decreasing saturated fats such as fatty red meat, butter and many fried foods. He will continue atorvastatin, increase vegetables and lean protein in his diet, and continue to work on exercise and weight loss efforts.  Obesity Matthew Garner is currently in the action stage of change. As such, his goal is to continue with weight loss efforts. He has agreed to follow the Category 2 plan. Matthew Garner has been instructed to work up to a goal of 150 minutes of combined cardio and strengthening exercise per week for weight loss and overall health benefits. We discussed the following Behavioral Modification Strategies today: work on meal planning, easy cooking plans, and keeping healthy foods in the home.  Matthew Garner has agreed to follow-up with our clinic  in 2 weeks. He was informed of the importance of frequent follow-up visits to maximize his success with intensive lifestyle modifications for his multiple health conditions.  ALLERGIES: No Known Allergies  MEDICATIONS: Current Outpatient  Medications on File Prior to Visit  Medication Sig Dispense Refill  . atorvastatin (LIPITOR) 10 MG tablet Take 1 tablet (10 mg total) by mouth daily at 6 PM. 90 tablet 1  . Cholecalciferol (VITAMIN D3) 5000 units TABS Take 1 tablet (5,000 Units total) by mouth daily at 12 noon. 30 tablet   . losartan (COZAAR) 50 MG tablet Take 1 tablet (50 mg total) by mouth every morning. 30 tablet 0  . metFORMIN (GLUCOPHAGE) 500 MG tablet Take 1 tablet (500 mg total) by mouth daily with breakfast. 30 tablet 0  . omeprazole (PRILOSEC) 20 MG capsule Take 1 capsule (20 mg total) by mouth daily. 30 capsule 0  . ranitidine (ZANTAC) 300 MG tablet Take 1 tablet (300 mg total) by mouth at bedtime. 30 tablet 3  . sildenafil (VIAGRA) 100 MG tablet TAKE 1/2 TABLET BY MOUTH DAILY AS NEEDED FOR ERECTILE DYSFUNCTION. 5 tablet 3   Current Facility-Administered Medications on File Prior to Visit  Medication Dose Route Frequency Provider Last Rate Last Dose  . 0.9 %  sodium chloride infusion  500 mL Intravenous Once Ladene Artist, MD        PAST MEDICAL HISTORY: Past Medical History:  Diagnosis Date  . Allergy    environmental  . Anemia   . Back pain   . Diabetes mellitus   . Environmental allergies 11/27/2011  . GERD (gastroesophageal reflux disease)   . Hyperlipidemia   . Hypertension   . Joint pain   . Knee pain, bilateral   . Sebaceous cyst 11/10/2012  . Snores     PAST SURGICAL HISTORY: Past Surgical History:  Procedure Laterality Date  . APPENDECTOMY    . KNEE SURGERY     right  . MASS EXCISION Bilateral 11/24/2012   Procedure: EXCISION SCALP MASS X2;  Surgeon: [name], MD;  Location: [facility];  Service: General;  Laterality: Bilateral;  . SPINE SURGERY  2004   lumb  . UPPER GASTROINTESTINAL ENDOSCOPY      SOCIAL HISTORY: Social History   Tobacco Use  . Smoking status: Never Smoker  . Smokeless tobacco: Never Used  Substance Use Topics  . Alcohol use: No  .  Drug use: No    FAMILY HISTORY: Family History  Problem Relation Age of Onset  . Colon cancer Neg Hx   . Colon polyps Neg Hx    ROS: Review of Systems  Cardiovascular: Negative for chest pain.  Gastrointestinal: Negative for nausea and vomiting.  Musculoskeletal:       Negative for muscle weakness.   PHYSICAL EXAM: Pt in no acute distress  RECENT LABS AND TESTS: BMET    Component Value Date/Time   NA 140 02/17/2018 1318   K 4.5 02/17/2018 1318   CL 99 02/17/2018 1318   CO2 23 02/17/2018 1318   GLUCOSE 104 (H) 02/17/2018 1318   GLUCOSE 179 (H) 09/02/2017 1634   BUN 18 02/17/2018 1318   CREATININE 0.77 02/17/2018 1318   CREATININE 0.76 09/15/2015 1525   CALCIUM 9.7 02/17/2018 1318   GFRNONAA 102 02/17/2018 1318   GFRNONAA >89 09/15/2015 1525   GFRAA 118 02/17/2018 1318   GFRAA >89 09/15/2015 1525   Lab Results  Component Value Date   HGBA1C 6.3 (H) 12/06/2017  HGBA1C 6.2 (H) 09/10/2017   HGBA1C 6.1 (H) 06/11/2017   HGBA1C 6.8 (H) 02/04/2017   HGBA1C 6.8 (H) 12/13/2016   Lab Results  Component Value Date   INSULIN 5.7 09/10/2017   INSULIN 15.5 02/04/2017   CBC    Component Value Date/Time   WBC 10.8 (H) 09/02/2017 1634   RBC 5.25 09/02/2017 1634   HGB 15.8 09/02/2017 1634   HGB 16.0 02/04/2017 1035   HCT 45.4 09/02/2017 1634   HCT 46.1 02/04/2017 1035   PLT 323 09/02/2017 1634   MCV 86.5 09/02/2017 1634   MCV 88 02/04/2017 1035   MCH 30.1 09/02/2017 1634   MCHC 34.8 09/02/2017 1634   RDW 13.2 09/02/2017 1634   RDW 13.4 02/04/2017 1035   LYMPHSABS 1.5 02/04/2017 1035   MONOABS 0.7 08/20/2016 0926   EOSABS 0.1 02/04/2017 1035   BASOSABS 0.1 02/04/2017 1035   Iron/TIBC/Ferritin/ %Sat No results found for: IRON, TIBC, FERRITIN, IRONPCTSAT Lipid Panel     Component Value Date/Time   CHOL 167 02/17/2018 1318   TRIG 65 02/17/2018 1318   HDL 65 02/17/2018 1318   CHOLHDL 2.6 12/13/2016 1632   CHOLHDL 3.0 09/15/2015 1525   VLDL 13 09/15/2015 1525    LDLCALC 89 02/17/2018 1318   Hepatic Function Panel     Component Value Date/Time   PROT 7.5 02/17/2018 1318   ALBUMIN 4.8 02/17/2018 1318   AST 17 02/17/2018 1318   ALT 19 02/17/2018 1318   ALKPHOS 79 02/17/2018 1318   BILITOT 0.4 02/17/2018 1318      Component Value Date/Time   TSH 1.620 02/04/2017 1035   Results for CARNEY, SAXTON (MRN [id number]) as of 04/09/2018 14:10  Ref. Range 02/17/2018 13:18  Vitamin D, 25-Hydroxy Latest Ref Range: 30.0 - 100.0 ng/mL 32.5    I, Michaelene Song, am acting as Location manager for [employer], PA-C I, Abby Potash, PA-C have reviewed above note and agree with its content

## 2018-04-24 ENCOUNTER — Encounter (INDEPENDENT_AMBULATORY_CARE_PROVIDER_SITE_OTHER): Payer: Self-pay | Admitting: Physician Assistant

## 2018-04-24 ENCOUNTER — Other Ambulatory Visit: Payer: Self-pay

## 2018-04-24 ENCOUNTER — Ambulatory Visit (INDEPENDENT_AMBULATORY_CARE_PROVIDER_SITE_OTHER): Payer: BLUE CROSS/BLUE SHIELD | Admitting: Physician Assistant

## 2018-04-24 DIAGNOSIS — I10 Essential (primary) hypertension: Secondary | ICD-10-CM

## 2018-04-24 DIAGNOSIS — E119 Type 2 diabetes mellitus without complications: Secondary | ICD-10-CM

## 2018-04-24 DIAGNOSIS — E785 Hyperlipidemia, unspecified: Secondary | ICD-10-CM

## 2018-04-24 DIAGNOSIS — E559 Vitamin D deficiency, unspecified: Secondary | ICD-10-CM

## 2018-04-24 DIAGNOSIS — Z6841 Body Mass Index (BMI) 40.0 and over, adult: Secondary | ICD-10-CM

## 2018-04-24 DIAGNOSIS — E66813 Obesity, class 3: Secondary | ICD-10-CM

## 2018-04-24 MED ORDER — METFORMIN HCL 500 MG PO TABS: 500.0000 mg | ORAL_TABLET | Freq: Every day | ORAL | 2 refills | Status: DC

## 2018-04-24 MED ORDER — VITAMIN D (ERGOCALCIFEROL) 1.25 MG (50000 UNIT) PO CAPS: 50000.0000 [IU] | ORAL_CAPSULE | ORAL | 0 refills | Status: DC

## 2018-04-24 MED ORDER — LOSARTAN POTASSIUM 50 MG PO TABS: 50.0000 mg | ORAL_TABLET | Freq: Every morning | ORAL | 0 refills | Status: DC

## 2018-04-24 MED ORDER — ATORVASTATIN CALCIUM 10 MG PO TABS: 10.0000 mg | ORAL_TABLET | Freq: Every day | ORAL | 2 refills | Status: DC

## 2018-04-24 MED ORDER — VITAMIN D3 125 MCG (5000 UT) PO TABS: 1.0000 | ORAL_TABLET | Freq: Every day | ORAL | Status: DC

## 2018-04-24 NOTE — Progress Notes (Signed)
Office: [phone]  /  Fax: [phone] TeleHealth Visit:  Matthew Garner has verbally consented to this TeleHealth visit today. The patient is located in the car, the provider is located at the [employer] [facility] office. The participants in this visit include the listed provider and patient. Adir was unable to use realtime audiovisual technology today and the telehealth visit was conducted via telephone.  HPI:   Chief Complaint: OBESITY Matthew Garner is here to discuss his progress with his obesity treatment plan. He is on the Category 2 plan and is following his eating plan approximately 90 % of the time. He states he is exercising 0 minutes 0 times per week. [name] reports that there are some days he is eating and some days he is not, due to stress from work. He starts Ramadan tomorrow for 1 month.  We were unable to weigh the patient today for this TeleHealth visit. He feels as if he has maintained weight since his last visit. He has lost 12 lbs since starting treatment with Matthew Garner.  Hyperlipidemia Matthew Garner has hyperlipidemia and is on atorvastatin 10 mg. He has been trying to improve his cholesterol levels with intensive lifestyle modification including a low saturated fat diet, exercise, and weight loss. He denies any chest pain.  Vitamin D deficiency Matthew Garner has a diagnosis of vitamin D deficiency. He is currently taking vit D and denies nausea, vomiting, or muscle weakness.  Diabetes II Matthew Garner has a diagnosis of diabetes type II. Matthew Garner states that his blood sugars average 110. His last A1c was 6.3 on 12/06/17. He is on metformin, but is not currently taking Rybelsus due to stomach pain. He has been working on intensive lifestyle modifications including diet, exercise, and weight loss to help control his blood glucose levels.  Hypertension [name] is a 58 y.o. male with hypertension. [name] is on losartan 50mg . He is working on weight loss to help control his blood pressure with the goal of  decreasing his risk of heart attack and stroke. [name] denies chest pain.  ASSESSMENT AND PLAN:  Hyperlipidemia, unspecified hyperlipidemia type - Plan: atorvastatin (LIPITOR) 10 MG tablet  Vitamin D deficiency  Type 2 diabetes mellitus without complication, without long-term current use of insulin (HCC) - Plan: metFORMIN (GLUCOPHAGE) 500 MG tablet  Essential hypertension - Plan: losartan (COZAAR) 50 MG tablet  Class 3 severe obesity with serious comorbidity and body mass index (BMI) of 40.0 to 44.9 in adult, unspecified obesity type ([place])  PLAN:  Hyperlipidemia Matthew Garner was informed of the American Heart Association Guidelines emphasizing intensive lifestyle modifications as the first line treatment for hyperlipidemia. We discussed many lifestyle modifications today in depth, and Matthew Garner will continue to work on decreasing saturated fats such as fatty red meat, butter and many fried foods. He will also increase vegetables and lean protein in his diet and continue to work on exercise and weight loss efforts. Matthew Garner agrees to continue taking atorvastatin 10 mg qd #30 with no refills and he will follow up in 3 weeks.  Vitamin D Deficiency Matthew Garner was informed that low vitamin D levels contribute to fatigue and are associated with obesity, breast, and colon cancer. Matthew Garner agrees to continue to take prescription Vit D @5 ,000 IU every day #30 with no refills and will follow up for routine testing of vitamin D, at least 2-3 times per year. He was informed of the risk of over-replacement of vitamin D and agrees to not increase his dose unless he discusses this with Matthew Garner first. Matthew Garner [employer]  agrees to follow up in 3 weeks as directed.  Diabetes II Matthew Garner has been given extensive diabetes education by myself today including ideal fasting and post-prandial blood glucose readings, individual ideal Hgb A1c goals, and hypoglycemia prevention. We discussed the importance of good blood sugar control to decrease the likelihood of  diabetic complications such as nephropathy, neuropathy, limb loss, blindness, coronary artery disease, and death. We discussed the importance of intensive lifestyle modification including diet, exercise, and weight loss as the first line treatment for diabetes. Matthew Garner agrees to continue his metformin 500 mg qAM #30 with no refills and will follow up at the agreed upon time.   Hypertension We discussed sodium restriction, working on healthy weight loss, and a regular exercise program as the means to achieve improved blood pressure control. We will continue to monitor his blood pressure as well as his progress with the above lifestyle modifications. He will continue his losartan 50 mg qd #30 with no refills and will watch for signs of hypotension as he continues his lifestyle modifications. Matthew Garner agreed with this plan and agreed to follow up as directed.   Obesity Matthew Garner is currently in the action stage of change. As such, his goal is to continue with weight loss efforts. He has agreed to follow the Category 2 plan. Matthew Garner has been instructed to work up to a goal of 150 minutes of combined cardio and strengthening exercise per week for weight loss and overall health benefits. We discussed the following Behavioral Modification Strategies today: work on meal planning and easy cooking plans and keeping healthy foods in the home.  Matthew Garner has agreed to follow up with our clinic in 3 weeks. He was informed of the importance of frequent follow up visits to maximize his success with intensive lifestyle modifications for his multiple health conditions.  ALLERGIES: No Known Allergies  MEDICATIONS: Current Outpatient Medications on File Prior to Visit  Medication Sig Dispense Refill  . Cholecalciferol (VITAMIN D3) 5000 units TABS Take 1 tablet (5,000 Units total) by mouth daily at 12 noon. 30 tablet   . omeprazole (PRILOSEC) 20 MG capsule Take 1 capsule (20 mg total) by mouth daily. 30 capsule 0  . ranitidine  (ZANTAC) 300 MG tablet Take 1 tablet (300 mg total) by mouth at bedtime. 30 tablet 3  . Semaglutide (RYBELSUS) 7 MG TABS Take 7 mg by mouth daily. 30 tablet 0  . sildenafil (VIAGRA) 100 MG tablet TAKE 1/2 TABLET BY MOUTH DAILY AS NEEDED FOR ERECTILE DYSFUNCTION. 5 tablet 3   Current Facility-Administered Medications on File Prior to Visit  Medication Dose Route Frequency Provider Last Rate Last Dose  . 0.9 %  sodium chloride infusion  500 mL Intravenous Once Ladene Artist, MD        PAST MEDICAL HISTORY: Past Medical History:  Diagnosis Date  . Allergy    environmental  . Anemia   . Back pain   . Diabetes mellitus   . Environmental allergies 11/27/2011  . GERD (gastroesophageal reflux disease)   . Hyperlipidemia   . Hypertension   . Joint pain   . Knee pain, bilateral   . Sebaceous cyst 11/10/2012  . Snores     PAST SURGICAL HISTORY: Past Surgical History:  Procedure Laterality Date  . APPENDECTOMY    . KNEE SURGERY     right  . MASS EXCISION Bilateral 11/24/2012   Procedure: EXCISION SCALP MASS X2;  Surgeon: [name], MD;  Location: [place];  Service: General;  Laterality: Bilateral;  . SPINE SURGERY  2004   lumb  . UPPER GASTROINTESTINAL ENDOSCOPY      SOCIAL HISTORY: Social History   Tobacco Use  . Smoking status: Never Smoker  . Smokeless tobacco: Never Used  Substance Use Topics  . Alcohol use: No  . Drug use: No    FAMILY HISTORY: Family History  Problem Relation Age of Onset  . Colon cancer Neg Hx   . Colon polyps Neg Hx     ROS: Review of Systems  Cardiovascular: Negative for chest pain.  Gastrointestinal: Negative for nausea and vomiting.  Musculoskeletal:       Negative for muscle weakness.    PHYSICAL EXAM: Pt in no acute distress  RECENT LABS AND TESTS: BMET    Component Value Date/Time   NA 140 02/17/2018 1318   K 4.5 02/17/2018 1318   CL 99 02/17/2018 1318   CO2 23 02/17/2018 1318   GLUCOSE 104  (H) 02/17/2018 1318   GLUCOSE 179 (H) 09/02/2017 1634   BUN 18 02/17/2018 1318   CREATININE 0.77 02/17/2018 1318   CREATININE 0.76 09/15/2015 1525   CALCIUM 9.7 02/17/2018 1318   GFRNONAA 102 02/17/2018 1318   GFRNONAA >89 09/15/2015 1525   GFRAA 118 02/17/2018 1318   GFRAA >89 09/15/2015 1525   Lab Results  Component Value Date   HGBA1C 6.3 (H) 12/06/2017   HGBA1C 6.2 (H) 09/10/2017   HGBA1C 6.1 (H) 06/11/2017   HGBA1C 6.8 (H) 02/04/2017   HGBA1C 6.8 (H) 12/13/2016   Lab Results  Component Value Date   INSULIN 5.7 09/10/2017   INSULIN 15.5 02/04/2017   CBC    Component Value Date/Time   WBC 10.8 (H) 09/02/2017 1634   RBC 5.25 09/02/2017 1634   HGB 15.8 09/02/2017 1634   HGB 16.0 02/04/2017 1035   HCT 45.4 09/02/2017 1634   HCT 46.1 02/04/2017 1035   PLT 323 09/02/2017 1634   MCV 86.5 09/02/2017 1634   MCV 88 02/04/2017 1035   MCH 30.1 09/02/2017 1634   MCHC 34.8 09/02/2017 1634   RDW 13.2 09/02/2017 1634   RDW 13.4 02/04/2017 1035   LYMPHSABS 1.5 02/04/2017 1035   MONOABS 0.7 08/20/2016 0926   EOSABS 0.1 02/04/2017 1035   BASOSABS 0.1 02/04/2017 1035   Iron/TIBC/Ferritin/ %Sat No results found for: IRON, TIBC, FERRITIN, IRONPCTSAT Lipid Panel     Component Value Date/Time   CHOL 167 02/17/2018 1318   TRIG 65 02/17/2018 1318   HDL 65 02/17/2018 1318   CHOLHDL 2.6 12/13/2016 1632   CHOLHDL 3.0 09/15/2015 1525   VLDL 13 09/15/2015 1525   LDLCALC 89 02/17/2018 1318   Hepatic Function Panel     Component Value Date/Time   PROT 7.5 02/17/2018 1318   ALBUMIN 4.8 02/17/2018 1318   AST 17 02/17/2018 1318   ALT 19 02/17/2018 1318   ALKPHOS 79 02/17/2018 1318   BILITOT 0.4 02/17/2018 1318      Component Value Date/Time   TSH 1.620 02/04/2017 1035   Results for TACUMA, GRAFFAM (MRN [id number]) as of 04/24/2018 16:45  Ref. Range 02/17/2018 13:18  Vitamin D, 25-Hydroxy Latest Ref Range: 30.0 - 100.0 ng/mL 32.5    I, Marcille Blanco, CMA, am acting as  transcriptionist for Abby Potash, PA-C I, Abby Potash, PA-C have reviewed above note and agree with its content

## 2018-04-28 ENCOUNTER — Telehealth (INDEPENDENT_AMBULATORY_CARE_PROVIDER_SITE_OTHER): Payer: Self-pay | Admitting: Physician Assistant

## 2018-04-28 NOTE — Telephone Encounter (Signed)
Patient states [facility] on [address] can not fill his Ranitindina medications.  Not sure if it's because they did not receive the refill request or they don't have the medication in stock.  Patient stated he needs his insurance to cover the cost if it needs to be switched to something else.  Thank you

## 2018-04-28 NOTE — Telephone Encounter (Signed)
Please advise. Zantac is on a nationwide recall. Renee Ramus, LPN

## 2018-04-29 MED ORDER — OMEPRAZOLE 40 MG PO CPDR: 40.0000 mg | DELAYED_RELEASE_CAPSULE | Freq: Every day | ORAL | 0 refills | Status: DC

## 2018-04-29 NOTE — Telephone Encounter (Signed)
Done pt aware. Renee Ramus, LPN

## 2018-05-13 ENCOUNTER — Ambulatory Visit (INDEPENDENT_AMBULATORY_CARE_PROVIDER_SITE_OTHER): Payer: BLUE CROSS/BLUE SHIELD | Admitting: Physician Assistant

## 2018-05-13 ENCOUNTER — Other Ambulatory Visit: Payer: Self-pay

## 2018-05-13 DIAGNOSIS — E559 Vitamin D deficiency, unspecified: Secondary | ICD-10-CM | POA: Diagnosis not present

## 2018-05-13 DIAGNOSIS — E66813 Obesity, class 3: Secondary | ICD-10-CM

## 2018-05-13 DIAGNOSIS — Z6841 Body Mass Index (BMI) 40.0 and over, adult: Secondary | ICD-10-CM | POA: Diagnosis not present

## 2018-05-14 NOTE — Progress Notes (Signed)
Office: [phone]  /  Fax: [phone] TeleHealth Visit:  Matthew Garner has verbally consented to this TeleHealth visit today. The patient is located in his car, the provider is located at the [facility] office. The participants in this visit include the listed provider and patient. The visit was conducted today via telephone call.  HPI:   Chief Complaint: OBESITY Matthew Garner is here to discuss his progress with his obesity treatment plan. He is on the Category 2 plan and is following his eating plan approximately 20% of the time. He states he is exercising 0 minutes 0 times per week. Matthew Garner reports that he is currently in Ramadan and, therefore, is only eating in the evening. He will get back on the plan when Ramadan is over in 10 days. We were unable to weigh the patient today for this TeleHealth visit. He feels as if he has maintained his weight since his last visit. He has lost 12 lbs since starting treatment with [name].  Vitamin D deficiency Matthew Garner has a diagnosis of Vitamin D deficiency. He is currently taking Vit D and denies nausea, vomiting or muscle weakness.  ASSESSMENT AND PLAN:  Vitamin D deficiency  Class 3 severe obesity with serious comorbidity and body mass index (BMI) of 40.0 to 44.9 in adult, unspecified obesity type ([place])  PLAN:  Vitamin D Deficiency Matthew Garner was informed that low Vitamin D levels contributes to fatigue and are associated with obesity, breast, and colon cancer. He agrees to continue taking Vit D and will follow-up for routine testing of Vitamin D, at least 2-3 times per year. He was informed of the risk of over-replacement of Vitamin D and agrees to not increase his dose unless he discusses this with [name] first. Matthew Garner agrees to follow-up with our clinic in 3 weeks.  Obesity Devery is currently in the action stage of change. As such, his goal is to continue with weight loss efforts. He has agreed to follow the Category 2 plan. Rein has been  instructed to work up to a goal of 150 minutes of combined cardio and strengthening exercise per week for weight loss and overall health benefits. We discussed the following Behavioral Modification Strategies today: keeping healthy foods in the home and ways to avoid boredom eating.  Matthew Garner has agreed to follow-up with our clinic in 3 weeks. He was informed of the importance of frequent follow-up visits to maximize his success with intensive lifestyle modifications for his multiple health conditions.  ALLERGIES: No Known Allergies  MEDICATIONS: Current Outpatient Medications on File Prior to Visit  Medication Sig Dispense Refill  . atorvastatin (LIPITOR) 10 MG tablet Take 1 tablet (10 mg total) by mouth daily at 6 PM. 30 tablet 2  . Cholecalciferol (VITAMIN D3) 125 MCG (5000 UT) TABS Take 1 tablet (5,000 Units total) by mouth daily at 12 noon. 30 tablet   . losartan (COZAAR) 50 MG tablet Take 1 tablet (50 mg total) by mouth every morning. 30 tablet 0  . metFORMIN (GLUCOPHAGE) 500 MG tablet Take 1 tablet (500 mg total) by mouth daily with breakfast. 30 tablet 2  . omeprazole (PRILOSEC) 40 MG capsule Take 1 capsule (40 mg total) by mouth daily. 30 capsule 0  . ranitidine (ZANTAC) 300 MG tablet Take 1 tablet (300 mg total) by mouth at bedtime. 30 tablet 3  . Semaglutide (RYBELSUS) 7 MG TABS Take 7 mg by mouth daily. 30 tablet 0  . sildenafil (VIAGRA) 100 MG tablet TAKE 1/2 TABLET BY MOUTH  DAILY AS NEEDED FOR ERECTILE DYSFUNCTION. 5 tablet 3   Current Facility-Administered Medications on File Prior to Visit  Medication Dose Route Frequency Provider Last Rate Last Dose  . 0.9 %  sodium chloride infusion  500 mL Intravenous Once Ladene Artist, MD        PAST MEDICAL HISTORY: Past Medical History:  Diagnosis Date  . Allergy    environmental  . Anemia   . Back pain   . Diabetes mellitus   . Environmental allergies 11/27/2011  . GERD (gastroesophageal reflux disease)   . Hyperlipidemia    . Hypertension   . Joint pain   . Knee pain, bilateral   . Sebaceous cyst 11/10/2012  . Snores     PAST SURGICAL HISTORY: Past Surgical History:  Procedure Laterality Date  . APPENDECTOMY    . KNEE SURGERY     right  . MASS EXCISION Bilateral 11/24/2012   Procedure: EXCISION SCALP MASS X2;  Surgeon: [name], MD;  Location: [facility];  Service: General;  Laterality: Bilateral;  . SPINE SURGERY  2004   lumb  . UPPER GASTROINTESTINAL ENDOSCOPY      SOCIAL HISTORY: Social History   Tobacco Use  . Smoking status: Never Smoker  . Smokeless tobacco: Never Used  Substance Use Topics  . Alcohol use: No  . Drug use: No    FAMILY HISTORY: Family History  Problem Relation Age of Onset  . Colon cancer Neg Hx   . Colon polyps Neg Hx    ROS: Review of Systems  Gastrointestinal: Negative for nausea and vomiting.  Musculoskeletal:       Negative for muscle weakness.   PHYSICAL EXAM: Pt in no acute distress  RECENT LABS AND TESTS: BMET    Component Value Date/Time   NA 140 02/17/2018 1318   K 4.5 02/17/2018 1318   CL 99 02/17/2018 1318   CO2 23 02/17/2018 1318   GLUCOSE 104 (H) 02/17/2018 1318   GLUCOSE 179 (H) 09/02/2017 1634   BUN 18 02/17/2018 1318   CREATININE 0.77 02/17/2018 1318   CREATININE 0.76 09/15/2015 1525   CALCIUM 9.7 02/17/2018 1318   GFRNONAA 102 02/17/2018 1318   GFRNONAA >89 09/15/2015 1525   GFRAA 118 02/17/2018 1318   GFRAA >89 09/15/2015 1525   Lab Results  Component Value Date   HGBA1C 6.3 (H) 12/06/2017   HGBA1C 6.2 (H) 09/10/2017   HGBA1C 6.1 (H) 06/11/2017   HGBA1C 6.8 (H) 02/04/2017   HGBA1C 6.8 (H) 12/13/2016   Lab Results  Component Value Date   INSULIN 5.7 09/10/2017   INSULIN 15.5 02/04/2017   CBC    Component Value Date/Time   WBC 10.8 (H) 09/02/2017 1634   RBC 5.25 09/02/2017 1634   HGB 15.8 09/02/2017 1634   HGB 16.0 02/04/2017 1035   HCT 45.4 09/02/2017 1634   HCT 46.1 02/04/2017 1035    PLT 323 09/02/2017 1634   MCV 86.5 09/02/2017 1634   MCV 88 02/04/2017 1035   MCH 30.1 09/02/2017 1634   MCHC 34.8 09/02/2017 1634   RDW 13.2 09/02/2017 1634   RDW 13.4 02/04/2017 1035   LYMPHSABS 1.5 02/04/2017 1035   MONOABS 0.7 08/20/2016 0926   EOSABS 0.1 02/04/2017 1035   BASOSABS 0.1 02/04/2017 1035   Iron/TIBC/Ferritin/ %Sat No results found for: IRON, TIBC, FERRITIN, IRONPCTSAT Lipid Panel     Component Value Date/Time   CHOL 167 02/17/2018 1318   TRIG 65 02/17/2018 1318   HDL 65  02/17/2018 1318   CHOLHDL 2.6 12/13/2016 1632   CHOLHDL 3.0 09/15/2015 1525   VLDL 13 09/15/2015 1525   LDLCALC 89 02/17/2018 1318   Hepatic Function Panel     Component Value Date/Time   PROT 7.5 02/17/2018 1318   ALBUMIN 4.8 02/17/2018 1318   AST 17 02/17/2018 1318   ALT 19 02/17/2018 1318   ALKPHOS 79 02/17/2018 1318   BILITOT 0.4 02/17/2018 1318      Component Value Date/Time   TSH 1.620 02/04/2017 1035   Results for AVANEESH, PEPITONE (MRN [id number]) as of 05/14/2018 08:58  Ref. Range 02/17/2018 13:18  Vitamin D, 25-Hydroxy Latest Ref Range: 30.0 - 100.0 ng/mL 32.5    I, Michaelene Song, am acting as Location manager for [employer], PA-C I, Abby Potash, PA-C have reviewed above note and agree with its content

## 2018-06-02 ENCOUNTER — Other Ambulatory Visit: Payer: Self-pay

## 2018-06-02 ENCOUNTER — Encounter (INDEPENDENT_AMBULATORY_CARE_PROVIDER_SITE_OTHER): Payer: Self-pay

## 2018-06-02 ENCOUNTER — Ambulatory Visit (INDEPENDENT_AMBULATORY_CARE_PROVIDER_SITE_OTHER): Payer: BLUE CROSS/BLUE SHIELD | Admitting: Physician Assistant

## 2018-06-02 DIAGNOSIS — Z6841 Body Mass Index (BMI) 40.0 and over, adult: Secondary | ICD-10-CM

## 2018-06-02 DIAGNOSIS — K219 Gastro-esophageal reflux disease without esophagitis: Secondary | ICD-10-CM

## 2018-06-02 DIAGNOSIS — E119 Type 2 diabetes mellitus without complications: Secondary | ICD-10-CM | POA: Diagnosis not present

## 2018-06-02 DIAGNOSIS — I10 Essential (primary) hypertension: Secondary | ICD-10-CM

## 2018-06-02 DIAGNOSIS — E559 Vitamin D deficiency, unspecified: Secondary | ICD-10-CM

## 2018-06-02 DIAGNOSIS — E785 Hyperlipidemia, unspecified: Secondary | ICD-10-CM | POA: Diagnosis not present

## 2018-06-03 MED ORDER — METFORMIN HCL 500 MG PO TABS: 500.0000 mg | ORAL_TABLET | Freq: Every day | ORAL | 0 refills | Status: DC

## 2018-06-03 MED ORDER — LOSARTAN POTASSIUM 50 MG PO TABS: 50.0000 mg | ORAL_TABLET | Freq: Every morning | ORAL | 0 refills | Status: DC

## 2018-06-03 MED ORDER — RANITIDINE HCL 300 MG PO TABS: 300.0000 mg | ORAL_TABLET | Freq: Every day | ORAL | 0 refills | Status: DC

## 2018-06-03 MED ORDER — VITAMIN D (ERGOCALCIFEROL) 1.25 MG (50000 UNIT) PO CAPS: 50000.0000 [IU] | ORAL_CAPSULE | ORAL | 0 refills | Status: DC

## 2018-06-03 MED ORDER — ATORVASTATIN CALCIUM 10 MG PO TABS: 10.0000 mg | ORAL_TABLET | Freq: Every day | ORAL | 0 refills | Status: DC

## 2018-06-03 NOTE — Progress Notes (Signed)
Office: [phone]  /  Fax: [phone] TeleHealth Visit:  Matthew Garner has verbally consented to this TeleHealth visit today. The patient is located at home, the provider is located at the [facility] office. The participants in this visit include the listed provider and patient. The visit was conducted today via telephone call.  HPI:   Chief Complaint: OBESITY Matthew Garner is here to discuss his progress with his obesity treatment plan. He is on the Category 2 plan and is following his eating plan approximately 90% of the time. He states he is exercising 0 minutes 0 times per week. Matthew Garner reports that he finished Ramadan last week and is back to eating on plan. He states his hunger is well controlled.  We were unable to weigh the patient today for this TeleHealth visit. He feels as if he has maintained his weight since his last visit. He has lost 12 lbs since starting treatment with [name].  Hyperlipidemia Matthew Garner has hyperlipidemia and has been trying to improve his cholesterol levels with intensive lifestyle modification including a low saturated fat diet, exercise and weight loss. He is on atorvastatin and denies any chest pain.  Hypertension Matthew Garner is a 58 y.o. male with hypertension.  Matthew Garner denies chest pain. He is working weight loss to help control his blood pressure with the goal of decreasing his risk of heart attack and stroke. Matthew Garner is on losartan.  Diabetes Mellitus Matthew Garner has a diagnosis of diabetes mellitus and is on metformin and Rybelsus. Matthew Garner states fasting blood sugars are in the range of 110 and 120. He denies any hypoglycemic episodes. Last A1c was 6.3 on 12/06/2017. He has been working on intensive lifestyle modifications including diet, exercise, and weight loss to help control his blood glucose levels. No nausea, vomiting, or diarrhea.  Gastroesophageal Reflux Disease (GERD) Matthew Garner is on Zantac and reports no reflux symptoms.  Vitamin D deficiency Matthew Garner  has a diagnosis of Vitamin D deficiency. He is currently taking prescription Vit D and denies nausea, vomiting or muscle weakness.  ASSESSMENT AND PLAN:  Hyperlipidemia, unspecified hyperlipidemia type - Plan: atorvastatin (LIPITOR) 10 MG tablet  Essential hypertension - Plan: losartan (COZAAR) 50 MG tablet  Type 2 diabetes mellitus without complication, without long-term current use of insulin (HCC) - Plan: metFORMIN (GLUCOPHAGE) 500 MG tablet, ranitidine (ZANTAC) 300 MG tablet  Gastroesophageal reflux disease, esophagitis presence not specified - Plan: ranitidine (ZANTAC) 300 MG tablet  Vitamin D deficiency - Plan: Vitamin D, Ergocalciferol, (DRISDOL) 1.25 MG (50000 UT) CAPS capsule  Class 3 severe obesity with serious comorbidity and body mass index (BMI) of 40.0 to 44.9 in adult, unspecified obesity type ([place])  PLAN:  Hyperlipidemia Matthew Garner was informed of the American Heart Association Guidelines emphasizing intensive lifestyle modifications as the first line treatment for hyperlipidemia. We discussed many lifestyle modifications today in depth, and Matthew Garner will continue to work on decreasing saturated fats such as fatty red meat, butter and many fried foods. Matthew Garner was given a refill on his atorvastatin #90 with 0 refills (requesting 90 day supply) and he agrees to follow-up with our clinic in 2 weeks. He will also increase vegetables and lean protein in his diet and continue to work on exercise and weight loss efforts.  Hypertension We discussed sodium restriction, working on healthy weight loss, and a regular exercise program as the means to achieve improved blood pressure control. Matthew Garner agreed with this plan and agreed to follow up as directed. We will continue to monitor his  blood pressure as well as his progress with the above lifestyle modifications. He was given a refill on his losartan #90 with 0 refills and will watch for signs of hypotension as he continues his lifestyle  modifications.  Diabetes Mellitus Matthew Garner has been given extensive diabetes education by myself today including ideal fasting and post-prandial blood glucose readings, individual ideal HgA1c goals  and hypoglycemia prevention. We discussed the importance of good blood sugar control to decrease the likelihood of diabetic complications such as nephropathy, neuropathy, limb loss, blindness, coronary artery disease, and death. We discussed the importance of intensive lifestyle modification including diet, exercise and weight loss as the first line treatment for diabetes. Matthew Garner was given a refill on his metformin #90 with 0 refills and agrees to follow-up with our clinic in 2 weeks.  Gastroesophageal Reflux Disease (GERD) Matthew Garner was given a refill on his Zantac #90 with 0 refills. He agrees to follow-up with our clinic in 2 weeks.  Vitamin D Deficiency Matthew Garner was informed that low Vitamin D levels contributes to fatigue and are associated with obesity, breast, and colon cancer. He agrees to continue to take prescription Vit D @ 50,000 IU every week #12 with 0 refills and will follow-up for routine testing of Vitamin D, at least 2-3 times per year. He was informed of the risk of over-replacement of Vitamin D and agrees to not increase his dose unless he discusses this with [name] first. Matthew Garner agrees to follow-up with our clinic in 2 weeks.  Obesity Matthew Garner is currently in the action stage of change. As such, his goal is to continue with weight loss efforts. He has agreed to follow the Category 2 plan. Matthew Garner has been instructed to work up to a goal of 150 minutes of combined cardio and strengthening exercise per week for weight loss and overall health benefits. We discussed the following Behavioral Modification Strategies today: work on meal planning, easy cooking plans, and keeping healthy foods in the home.  Matthew Garner has agreed to follow-up with our clinic in 2 weeks. He was informed of the importance of frequent  follow-up visits to maximize his success with intensive lifestyle modifications for his multiple health conditions.  ALLERGIES: No Known Allergies  MEDICATIONS: Current Outpatient Medications on File Prior to Visit  Medication Sig Dispense Refill  . atorvastatin (LIPITOR) 10 MG tablet Take 1 tablet (10 mg total) by mouth daily at 6 PM. 30 tablet 2  . Cholecalciferol (VITAMIN D3) 125 MCG (5000 UT) TABS Take 1 tablet (5,000 Units total) by mouth daily at 12 noon. 30 tablet   . losartan (COZAAR) 50 MG tablet Take 1 tablet (50 mg total) by mouth every morning. 30 tablet 0  . metFORMIN (GLUCOPHAGE) 500 MG tablet Take 1 tablet (500 mg total) by mouth daily with breakfast. 30 tablet 2  . omeprazole (PRILOSEC) 40 MG capsule Take 1 capsule (40 mg total) by mouth daily. 30 capsule 0  . ranitidine (ZANTAC) 300 MG tablet Take 1 tablet (300 mg total) by mouth at bedtime. 30 tablet 3  . Semaglutide (RYBELSUS) 7 MG TABS Take 7 mg by mouth daily. 30 tablet 0  . sildenafil (VIAGRA) 100 MG tablet TAKE 1/2 TABLET BY MOUTH DAILY AS NEEDED FOR ERECTILE DYSFUNCTION. 5 tablet 3   Current Facility-Administered Medications on File Prior to Visit  Medication Dose Route Frequency Provider Last Rate Last Dose  . 0.9 %  sodium chloride infusion  500 mL Intravenous Once Ladene Artist, MD  PAST MEDICAL HISTORY: Past Medical History:  Diagnosis Date  . Allergy    environmental  . Anemia   . Back pain   . Diabetes mellitus   . Environmental allergies 11/27/2011  . GERD (gastroesophageal reflux disease)   . Hyperlipidemia   . Hypertension   . Joint pain   . Knee pain, bilateral   . Sebaceous cyst 11/10/2012  . Snores     PAST SURGICAL HISTORY: Past Surgical History:  Procedure Laterality Date  . APPENDECTOMY    . KNEE SURGERY     right  . MASS EXCISION Bilateral 11/24/2012   Procedure: EXCISION SCALP MASS X2;  Surgeon: [name], MD;  Location: [facility];  Service:  General;  Laterality: Bilateral;  . SPINE SURGERY  2004   lumb  . UPPER GASTROINTESTINAL ENDOSCOPY      SOCIAL HISTORY: Social History   Tobacco Use  . Smoking status: Never Smoker  . Smokeless tobacco: Never Used  Substance Use Topics  . Alcohol use: No  . Drug use: No    FAMILY HISTORY: Family History  Problem Relation Age of Onset  . Colon cancer Neg Hx   . Colon polyps Neg Hx    ROS: Review of Systems  Cardiovascular: Negative for chest pain.  Gastrointestinal: Negative for diarrhea, nausea and vomiting.       Positive for gastroesophageal reflux disease.  Musculoskeletal:       Negative for muscle weakness.  Endo/Heme/Allergies:       Negative for hypoglycemia.   PHYSICAL EXAM: Pt in no acute distress  RECENT LABS AND TESTS: BMET    Component Value Date/Time   NA 140 02/17/2018 1318   K 4.5 02/17/2018 1318   CL 99 02/17/2018 1318   CO2 23 02/17/2018 1318   GLUCOSE 104 (H) 02/17/2018 1318   GLUCOSE 179 (H) 09/02/2017 1634   BUN 18 02/17/2018 1318   CREATININE 0.77 02/17/2018 1318   CREATININE 0.76 09/15/2015 1525   CALCIUM 9.7 02/17/2018 1318   GFRNONAA 102 02/17/2018 1318   GFRNONAA >89 09/15/2015 1525   GFRAA 118 02/17/2018 1318   GFRAA >89 09/15/2015 1525   Lab Results  Component Value Date   HGBA1C 6.3 (H) 12/06/2017   HGBA1C 6.2 (H) 09/10/2017   HGBA1C 6.1 (H) 06/11/2017   HGBA1C 6.8 (H) 02/04/2017   HGBA1C 6.8 (H) 12/13/2016   Lab Results  Component Value Date   INSULIN 5.7 09/10/2017   INSULIN 15.5 02/04/2017   CBC    Component Value Date/Time   WBC 10.8 (H) 09/02/2017 1634   RBC 5.25 09/02/2017 1634   HGB 15.8 09/02/2017 1634   HGB 16.0 02/04/2017 1035   HCT 45.4 09/02/2017 1634   HCT 46.1 02/04/2017 1035   PLT 323 09/02/2017 1634   MCV 86.5 09/02/2017 1634   MCV 88 02/04/2017 1035   MCH 30.1 09/02/2017 1634   MCHC 34.8 09/02/2017 1634   RDW 13.2 09/02/2017 1634   RDW 13.4 02/04/2017 1035   LYMPHSABS 1.5 02/04/2017 1035    MONOABS 0.7 08/20/2016 0926   EOSABS 0.1 02/04/2017 1035   BASOSABS 0.1 02/04/2017 1035   Iron/TIBC/Ferritin/ %Sat No results found for: IRON, TIBC, FERRITIN, IRONPCTSAT Lipid Panel     Component Value Date/Time   CHOL 167 02/17/2018 1318   TRIG 65 02/17/2018 1318   HDL 65 02/17/2018 1318   CHOLHDL 2.6 12/13/2016 1632   CHOLHDL 3.0 09/15/2015 1525   VLDL 13 09/15/2015 1525   LDLCALC 89  02/17/2018 1318   Hepatic Function Panel     Component Value Date/Time   PROT 7.5 02/17/2018 1318   ALBUMIN 4.8 02/17/2018 1318   AST 17 02/17/2018 1318   ALT 19 02/17/2018 1318   ALKPHOS 79 02/17/2018 1318   BILITOT 0.4 02/17/2018 1318      Component Value Date/Time   TSH 1.620 02/04/2017 1035   Results for WEN, MERCED (MRN [id number]) as of 06/03/2018 11:03  Ref. Range 02/17/2018 13:18  Vitamin D, 25-Hydroxy Latest Ref Range: 30.0 - 100.0 ng/mL 32.5    I, Michaelene Song, am acting as Location manager for [employer], PA-C I, Abby Potash, PA-C have reviewed above note and agree with its content

## 2018-06-04 ENCOUNTER — Telehealth (INDEPENDENT_AMBULATORY_CARE_PROVIDER_SITE_OTHER): Payer: Self-pay | Admitting: Physician Assistant

## 2018-06-04 ENCOUNTER — Other Ambulatory Visit (INDEPENDENT_AMBULATORY_CARE_PROVIDER_SITE_OTHER): Payer: Self-pay

## 2018-06-04 MED ORDER — OMEPRAZOLE 40 MG PO CPDR: 40.0000 mg | DELAYED_RELEASE_CAPSULE | Freq: Every day | ORAL | 0 refills | Status: DC

## 2018-06-04 NOTE — Telephone Encounter (Signed)
Pt need his Omedrazone prescription.  He said this prescription is missing.

## 2018-06-04 NOTE — Telephone Encounter (Signed)
Medication sent.

## 2018-06-16 ENCOUNTER — Ambulatory Visit (INDEPENDENT_AMBULATORY_CARE_PROVIDER_SITE_OTHER): Payer: BLUE CROSS/BLUE SHIELD | Admitting: Physician Assistant

## 2018-06-16 ENCOUNTER — Encounter (INDEPENDENT_AMBULATORY_CARE_PROVIDER_SITE_OTHER): Payer: Self-pay

## 2018-06-23 ENCOUNTER — Encounter (INDEPENDENT_AMBULATORY_CARE_PROVIDER_SITE_OTHER): Payer: Self-pay | Admitting: Physician Assistant

## 2018-06-23 ENCOUNTER — Telehealth (INDEPENDENT_AMBULATORY_CARE_PROVIDER_SITE_OTHER): Payer: BLUE CROSS/BLUE SHIELD | Admitting: Physician Assistant

## 2018-06-23 ENCOUNTER — Other Ambulatory Visit: Payer: Self-pay

## 2018-07-10 ENCOUNTER — Ambulatory Visit (INDEPENDENT_AMBULATORY_CARE_PROVIDER_SITE_OTHER): Payer: BLUE CROSS/BLUE SHIELD | Admitting: Physician Assistant

## 2018-07-10 ENCOUNTER — Other Ambulatory Visit: Payer: Self-pay

## 2018-07-10 ENCOUNTER — Encounter (INDEPENDENT_AMBULATORY_CARE_PROVIDER_SITE_OTHER): Payer: Self-pay | Admitting: Physician Assistant

## 2018-07-10 VITALS — BP 145/81 | HR 59 | Temp 98.0°F | Ht 64.0 in | Wt 238.0 lb

## 2018-07-10 DIAGNOSIS — E559 Vitamin D deficiency, unspecified: Secondary | ICD-10-CM | POA: Diagnosis not present

## 2018-07-10 DIAGNOSIS — Z6841 Body Mass Index (BMI) 40.0 and over, adult: Secondary | ICD-10-CM

## 2018-07-10 DIAGNOSIS — E119 Type 2 diabetes mellitus without complications: Secondary | ICD-10-CM

## 2018-07-10 DIAGNOSIS — K219 Gastro-esophageal reflux disease without esophagitis: Secondary | ICD-10-CM | POA: Diagnosis not present

## 2018-07-10 DIAGNOSIS — Z9189 Other specified personal risk factors, not elsewhere classified: Secondary | ICD-10-CM

## 2018-07-10 MED ORDER — OMEPRAZOLE 40 MG PO CPDR: 40.0000 mg | DELAYED_RELEASE_CAPSULE | Freq: Every day | ORAL | 0 refills | Status: DC

## 2018-07-10 NOTE — Progress Notes (Signed)
Office: [phone]  /  Fax: [phone]   HPI:   Chief Complaint: OBESITY Matthew Garner is here to discuss his progress with his obesity treatment plan. He is on the Category 2 plan and is following his eating plan approximately 90 % of the time. He states he is exercising 0 minutes 0 times per week. Matthew Garner reports that he has been following the plan well. He is experiencing excessive hunger around 3 pm daily.  His weight is 238 lb (108 kg) today and has gained 3 lbs since his last visit. He has lost 9 lbs since starting treatment with [name].  GERD Matthew Garner reports that Prilosec helps. He denies nausea, vomiting, or diarrhea.  Diabetes II Matthew Garner has a diagnosis of diabetes type II. Matthew Garner states his fasting BGs range between 98 and 120. He is on metformin and denies nausea, vomiting, or diarrhea. Last A1c was 6.3. He denies hypoglycemia. He has been working on intensive lifestyle modifications including diet, exercise, and weight loss to help control his blood glucose levels.  At risk for cardiovascular disease Matthew Garner is at a higher than average risk for cardiovascular disease due to obesity and diabetes II. He currently denies any chest pain.  Vitamin D Deficiency Matthew Garner has a diagnosis of vitamin D deficiency. He is on OTC Vit D 5,000 units daily. He denies nausea, vomiting or muscle weakness.  ASSESSMENT AND PLAN:  Gastroesophageal reflux disease, esophagitis presence not specified - Plan: omeprazole (PRILOSEC) 40 MG capsule  Type 2 diabetes mellitus without complication, without long-term current use of insulin ([place]) - Plan: Comprehensive metabolic panel, Hemoglobin A1c, Insulin, random  Vitamin D deficiency - Plan: VITAMIN D 25 Hydroxy (Vit-D Deficiency, Fractures)  At risk for heart disease  Class 3 severe obesity with serious comorbidity and body mass index (BMI) of 40.0 to 44.9 in adult, unspecified obesity type ([place])  PLAN:  GERD [name] agrees to continue taking Prilosec 40 mg q  daily #30 and we will refill for 1 month. [name] agrees to follow up with our clinic in 3 weeks.  Diabetes II  [name] has been given extensive diabetes education by myself today including ideal fasting and post-prandial blood glucose readings, individual ideal Hgb A1c goals and hypoglycemia prevention. We discussed the importance of good blood sugar control to decrease the likelihood of diabetic complications such as nephropathy, neuropathy, limb loss, blindness, coronary artery disease, and death. We discussed the importance of intensive lifestyle modification including diet, exercise and weight loss as the first line treatment for diabetes. [name] agrees to continue taking metformin with lunch, and we will check Hgb A1c and insulin today. Matthew Garner agrees to follow up with our clinic in 3 weeks.  Cardiovascular risk counseling Matthew Garner was given extended (15 minutes) coronary artery disease prevention counseling today. He is 58 y.o. male and has risk factors for heart disease including obesity and diabetes II. We discussed intensive lifestyle modifications today with an emphasis on specific weight loss instructions and strategies. Pt was also informed of the importance of increasing exercise and decreasing saturated fats to help prevent heart disease.  Vitamin D Deficiency Matthew Garner was informed that low vitamin D levels contributes to fatigue and are associated with obesity, breast, and colon cancer. He agrees to continue taking OTC Vit D 5,000 IU daily and will follow up for routine testing of vitamin D, at least 2-3 times per year. He was informed of the risk of over-replacement of vitamin D and agrees to not increase his dose unless he discusses this  with [name] first. We will check Vit D level today. Matthew Garner agrees to follow up with our clinic in 3 weeks.  Obesity Matthew Garner is currently in the action stage of change. As such, his goal is to continue with weight loss efforts He has agreed to change to follow the  Pescatarian eating plan Matthew Garner has been instructed to work up to a goal of 150 minutes of combined cardio and strengthening exercise per week for weight loss and overall health benefits. We discussed the following Behavioral Modification Strategies today: work on meal planning and easy cooking plans and keeping healthy foods in the home   Matthew Garner has agreed to follow up with our clinic in 3 weeks. He was informed of the importance of frequent follow up visits to maximize his success with intensive lifestyle modifications for his multiple health conditions.  ALLERGIES: No Known Allergies  MEDICATIONS: Current Outpatient Medications on File Prior to Visit  Medication Sig Dispense Refill  . atorvastatin (LIPITOR) 10 MG tablet Take 1 tablet (10 mg total) by mouth daily at 6 PM. 90 tablet 0  . Cholecalciferol (VITAMIN D3) 125 MCG (5000 UT) TABS Take 1 tablet (5,000 Units total) by mouth daily at 12 noon. 30 tablet   . losartan (COZAAR) 50 MG tablet Take 1 tablet (50 mg total) by mouth every morning. 90 tablet 0  . metFORMIN (GLUCOPHAGE) 500 MG tablet Take 1 tablet (500 mg total) by mouth daily with breakfast. 90 tablet 0  . sildenafil (VIAGRA) 100 MG tablet TAKE 1/2 TABLET BY MOUTH DAILY AS NEEDED FOR ERECTILE DYSFUNCTION. 5 tablet 3   Current Facility-Administered Medications on File Prior to Visit  Medication Dose Route Frequency Provider Last Rate Last Dose  . 0.9 %  sodium chloride infusion  500 mL Intravenous Once Matthew Artist, MD        PAST MEDICAL HISTORY: Past Medical History:  Diagnosis Date  . Allergy    environmental  . Anemia   . Back pain   . Diabetes mellitus   . Environmental allergies 11/27/2011  . GERD (gastroesophageal reflux disease)   . Hyperlipidemia   . Hypertension   . Joint pain   . Knee pain, bilateral   . Sebaceous cyst 11/10/2012  . Snores     PAST SURGICAL HISTORY: Past Surgical History:  Procedure Laterality Date  . APPENDECTOMY    . KNEE  SURGERY     right  . MASS EXCISION Bilateral 11/24/2012   Procedure: EXCISION SCALP MASS X2;  Surgeon: Matthew Bowie, MD;  Location: [facility];  Service: General;  Laterality: Bilateral;  . SPINE SURGERY  2004   lumb  . UPPER GASTROINTESTINAL ENDOSCOPY      SOCIAL HISTORY: Social History   Tobacco Use  . Smoking status: Never Smoker  . Smokeless tobacco: Never Used  Substance Use Topics  . Alcohol use: No  . Drug use: No    FAMILY HISTORY: Family History  Problem Relation Age of Onset  . Colon cancer Neg Hx   . Colon polyps Neg Hx     ROS: Review of Systems  Constitutional: Negative for weight loss.  Cardiovascular: Negative for chest pain.  Gastrointestinal: Negative for diarrhea, nausea and vomiting.  Musculoskeletal:       Negative muscle weakness  Endo/Heme/Allergies:       Negative hypoglycemia    PHYSICAL EXAM: Blood pressure (!) 145/81, pulse (!) 59, temperature 98 F (36.7 C), temperature source Oral, height 5\' 4"  (1.626 m), weight  238 lb (108 kg), SpO2 98 %. Body mass index is 40.85 kg/m. Physical Exam Vitals signs reviewed.  Constitutional:      Appearance: Normal appearance. He is obese.  Cardiovascular:     Rate and Rhythm: Normal rate.     Pulses: Normal pulses.  Pulmonary:     Effort: Pulmonary effort is normal.     Breath sounds: Normal breath sounds.  Musculoskeletal: Normal range of motion.  Skin:    General: Skin is warm and dry.  Neurological:     Mental Status: He is alert and oriented to person, place, and time.  Psychiatric:        Mood and Affect: Mood normal.        Behavior: Behavior normal.     RECENT LABS AND TESTS: BMET    Component Value Date/Time   NA 140 02/17/2018 1318   K 4.5 02/17/2018 1318   CL 99 02/17/2018 1318   CO2 23 02/17/2018 1318   GLUCOSE 104 (H) 02/17/2018 1318   GLUCOSE 179 (H) 09/02/2017 1634   BUN 18 02/17/2018 1318   CREATININE 0.77 02/17/2018 1318   CREATININE 0.76  09/15/2015 1525   CALCIUM 9.7 02/17/2018 1318   GFRNONAA 102 02/17/2018 1318   GFRNONAA >89 09/15/2015 1525   GFRAA 118 02/17/2018 1318   GFRAA >89 09/15/2015 1525   Lab Results  Component Value Date   HGBA1C 6.3 (H) 12/06/2017   HGBA1C 6.2 (H) 09/10/2017   HGBA1C 6.1 (H) 06/11/2017   HGBA1C 6.8 (H) 02/04/2017   HGBA1C 6.8 (H) 12/13/2016   Lab Results  Component Value Date   INSULIN 5.7 09/10/2017   INSULIN 15.5 02/04/2017   CBC    Component Value Date/Time   WBC 10.8 (H) 09/02/2017 1634   RBC 5.25 09/02/2017 1634   HGB 15.8 09/02/2017 1634   HGB 16.0 02/04/2017 1035   HCT 45.4 09/02/2017 1634   HCT 46.1 02/04/2017 1035   PLT 323 09/02/2017 1634   MCV 86.5 09/02/2017 1634   MCV 88 02/04/2017 1035   MCH 30.1 09/02/2017 1634   MCHC 34.8 09/02/2017 1634   RDW 13.2 09/02/2017 1634   RDW 13.4 02/04/2017 1035   LYMPHSABS 1.5 02/04/2017 1035   MONOABS 0.7 08/20/2016 0926   EOSABS 0.1 02/04/2017 1035   BASOSABS 0.1 02/04/2017 1035   Iron/TIBC/Ferritin/ %Sat No results found for: IRON, TIBC, FERRITIN, IRONPCTSAT Lipid Panel     Component Value Date/Time   CHOL 167 02/17/2018 1318   TRIG 65 02/17/2018 1318   HDL 65 02/17/2018 1318   CHOLHDL 2.6 12/13/2016 1632   CHOLHDL 3.0 09/15/2015 1525   VLDL 13 09/15/2015 1525   LDLCALC 89 02/17/2018 1318   Hepatic Function Panel     Component Value Date/Time   PROT 7.5 02/17/2018 1318   ALBUMIN 4.8 02/17/2018 1318   AST 17 02/17/2018 1318   ALT 19 02/17/2018 1318   ALKPHOS 79 02/17/2018 1318   BILITOT 0.4 02/17/2018 1318      Component Value Date/Time   TSH 1.620 02/04/2017 1035      OBESITY BEHAVIORAL INTERVENTION VISIT  Today's visit was # 22   Starting weight: 247 lbs Starting date: 02/04/17 Today's weight : 238 lbs  Today's date: 07/10/2018 Total lbs lost to date: 9    ASK: We discussed the diagnosis of obesity with Wallace Keller today and [name] agreed to give [name] permission to discuss obesity behavioral  modification therapy today.  ASSESS: Nayshawn has the diagnosis of obesity and  his BMI today is 4.83 Hershall is in the action stage of change   ADVISE: Broly was educated on the multiple health risks of obesity as well as the benefit of weight loss to improve his health. He was advised of the need for long term treatment and the importance of lifestyle modifications to improve his current health and to decrease his risk of future health problems.  AGREE: Multiple dietary modification options and treatment options were discussed and  Lanis agreed to follow the recommendations documented in the above note.  ARRANGE: Lanard was educated on the importance of frequent visits to treat obesity as outlined per CMS and USPSTF guidelines and agreed to schedule his next follow up appointment today.  Wilhemena Durie, am acting as transcriptionist for Abby Potash, PA-C I, Abby Potash, PA-C have reviewed above note and agree with its content

## 2018-07-11 LAB — COMPREHENSIVE METABOLIC PANEL
ALT: 16 IU/L (ref 0–44)
AST: 19 IU/L (ref 0–40)
Albumin/Globulin Ratio: 1.8 (ref 1.2–2.2)
Albumin: 4.5 g/dL (ref 3.8–4.9)
Alkaline Phosphatase: 84 IU/L (ref 39–117)
BUN/Creatinine Ratio: 24 — ABNORMAL HIGH (ref 9–20)
BUN: 18 mg/dL (ref 6–24)
Bilirubin Total: 0.3 mg/dL (ref 0.0–1.2)
CO2: 21 mmol/L (ref 20–29)
Calcium: 9.6 mg/dL (ref 8.7–10.2)
Chloride: 99 mmol/L (ref 96–106)
Creatinine, Ser: 0.76 mg/dL (ref 0.76–1.27)
GFR calc Af Amer: 119 mL/min/{1.73_m2} (ref >59)
GFR calc non Af Amer: 103 mL/min/{1.73_m2} (ref >59)
Globulin, Total: 2.5 g/dL (ref 1.5–4.5)
Glucose: 109 mg/dL — ABNORMAL HIGH (ref 65–99)
Potassium: 4.4 mmol/L (ref 3.5–5.2)
Sodium: 139 mmol/L (ref 134–144)
Total Protein: 7 g/dL (ref 6.0–8.5)

## 2018-07-11 LAB — HEMOGLOBIN A1C
Est. average glucose Bld gHb Est-mCnc: 126 mg/dL
Hgb A1c MFr Bld: 6 % — ABNORMAL HIGH (ref 4.8–5.6)

## 2018-07-11 LAB — VITAMIN D 25 HYDROXY (VIT D DEFICIENCY, FRACTURES): Vit D, 25-Hydroxy: 37.8 ng/mL (ref 30.0–100.0)

## 2018-07-11 LAB — INSULIN, RANDOM: INSULIN: 13.5 u[IU]/mL (ref 2.6–24.9)

## 2018-07-22 ENCOUNTER — Ambulatory Visit: Payer: BLUE CROSS/BLUE SHIELD | Admitting: Family Medicine

## 2018-08-05 ENCOUNTER — Ambulatory Visit (INDEPENDENT_AMBULATORY_CARE_PROVIDER_SITE_OTHER): Payer: BLUE CROSS/BLUE SHIELD | Admitting: Physician Assistant

## 2018-08-06 ENCOUNTER — Ambulatory Visit (INDEPENDENT_AMBULATORY_CARE_PROVIDER_SITE_OTHER): Payer: BLUE CROSS/BLUE SHIELD | Admitting: Physician Assistant

## 2018-08-06 ENCOUNTER — Other Ambulatory Visit: Payer: Self-pay

## 2018-08-06 VITALS — BP 116/73 | HR 65 | Temp 98.3°F | Ht 64.0 in | Wt 242.0 lb

## 2018-08-06 DIAGNOSIS — E559 Vitamin D deficiency, unspecified: Secondary | ICD-10-CM | POA: Diagnosis not present

## 2018-08-06 DIAGNOSIS — Z6841 Body Mass Index (BMI) 40.0 and over, adult: Secondary | ICD-10-CM

## 2018-08-06 NOTE — Progress Notes (Signed)
Office: [phone]  /  Fax: [phone]   HPI:   Chief Complaint: OBESITY Matthew Garner is here to discuss his progress with his obesity treatment plan. He is on the Category 2 plan and is following his eating plan approximately 95% of the time. He states he is exercising 0 minutes 0 times per week. Michoel reports that he is following the plan closely. He is frustrated with the lack of weight loss. His weight is 242 lb (109.8 kg) today and has had a weight gain of 4 lbs since his last visit. He has lost 5 lbs since starting treatment with [name].  Vitamin D deficiency Kvon has a diagnosis of Vitamin D deficiency. He is currently taking Vit D and denies nausea, vomiting or muscle weakness.  ASSESSMENT AND PLAN:  Vitamin D deficiency  Class 3 severe obesity with serious comorbidity and body mass index (BMI) of 40.0 to 44.9 in adult, unspecified obesity type ([place])  PLAN:  Vitamin D Deficiency Meko was informed that low Vitamin D levels contributes to fatigue and are associated with obesity, breast, and colon cancer. He agrees to continue taking Vit D and will follow-up for routine testing of Vitamin D, at least 2-3 times per year. He was informed of the risk of over-replacement of Vitamin D and agrees to not increase his dose unless he discusses this with [name] first. Jabar agrees to follow-up with our clinic in 2 weeks.  I spent > than 50% of the 15 minute visit on counseling as documented in the note.  Obesity Facundo is currently in the action stage of change. As such, his goal is to continue with weight loss efforts. He will change plans and will follow a lower carbohydrate, vegetable and lean protein rich diet plan. Hanish has been instructed to work up to a goal of 150 minutes of combined cardio and strengthening exercise per week for weight loss and overall health benefits. We discussed the following Behavioral Modification Strategies today: work on meal planning and easy cooking plans, and  keeping healthy foods in the home.  Darrien has agreed to follow-up with our clinic in 2 weeks. He was informed of the importance of frequent follow-up visits to maximize his success with intensive lifestyle modifications for his multiple health conditions.  ALLERGIES: No Known Allergies  MEDICATIONS: Current Outpatient Medications on File Prior to Visit  Medication Sig Dispense Refill   atorvastatin (LIPITOR) 10 MG tablet Take 1 tablet (10 mg total) by mouth daily at 6 PM. 90 tablet 0   Cholecalciferol (VITAMIN D3) 125 MCG (5000 UT) TABS Take 1 tablet (5,000 Units total) by mouth daily at 12 noon. 30 tablet    losartan (COZAAR) 50 MG tablet Take 1 tablet (50 mg total) by mouth every morning. 90 tablet 0   metFORMIN (GLUCOPHAGE) 500 MG tablet Take 1 tablet (500 mg total) by mouth daily with breakfast. 90 tablet 0   omeprazole (PRILOSEC) 40 MG capsule Take 1 capsule (40 mg total) by mouth daily. 30 capsule 0   sildenafil (VIAGRA) 100 MG tablet TAKE 1/2 TABLET BY MOUTH DAILY AS NEEDED FOR ERECTILE DYSFUNCTION. 5 tablet 3   Current Facility-Administered Medications on File Prior to Visit  Medication Dose Route Frequency Provider Last Rate Last Dose   0.9 %  sodium chloride infusion  500 mL Intravenous Once Ladene Artist, MD        PAST MEDICAL HISTORY: Past Medical History:  Diagnosis Date   Allergy    environmental   Anemia  Back pain    Diabetes mellitus    Environmental allergies 11/27/2011   GERD (gastroesophageal reflux disease)    Hyperlipidemia    Hypertension    Joint pain    Knee pain, bilateral    Sebaceous cyst 11/10/2012   Snores     PAST SURGICAL HISTORY: Past Surgical History:  Procedure Laterality Date   APPENDECTOMY     KNEE SURGERY     right   MASS EXCISION Bilateral 11/24/2012   Procedure: EXCISION SCALP MASS X2;  Surgeon: [name], MD;  Location: [facility];  Service: General;  Laterality: Bilateral;    SPINE SURGERY  2004   lumb   UPPER GASTROINTESTINAL ENDOSCOPY      SOCIAL HISTORY: Social History   Tobacco Use   Smoking status: Never Smoker   Smokeless tobacco: Never Used  Substance Use Topics   Alcohol use: No   Drug use: No    FAMILY HISTORY: Family History  Problem Relation Age of Onset   Colon cancer Neg Hx    Colon polyps Neg Hx    ROS: Review of Systems  Gastrointestinal: Negative for nausea and vomiting.  Musculoskeletal:       Negative for muscle weakness.   PHYSICAL EXAM: Blood pressure 116/73, pulse 65, temperature 98.3 F (36.8 C), temperature source Oral, height 5\' 4"  (1.626 m), weight 242 lb (109.8 kg), SpO2 98 %. Body mass index is 41.54 kg/m. Physical Exam Vitals signs reviewed.  Constitutional:      Appearance: Normal appearance. He is obese.  Cardiovascular:     Rate and Rhythm: Normal rate.     Pulses: Normal pulses.  Pulmonary:     Effort: Pulmonary effort is normal.     Breath sounds: Normal breath sounds.  Musculoskeletal: Normal range of motion.  Skin:    General: Skin is warm and dry.  Neurological:     Mental Status: He is alert and oriented to person, place, and time.  Psychiatric:        Behavior: Behavior normal.   RECENT LABS AND TESTS: BMET    Component Value Date/Time   NA 139 07/10/2018 1154   K 4.4 07/10/2018 1154   CL 99 07/10/2018 1154   CO2 21 07/10/2018 1154   GLUCOSE 109 (H) 07/10/2018 1154   GLUCOSE 179 (H) 09/02/2017 1634   BUN 18 07/10/2018 1154   CREATININE 0.76 07/10/2018 1154   CREATININE 0.76 09/15/2015 1525   CALCIUM 9.6 07/10/2018 1154   GFRNONAA 103 07/10/2018 1154   GFRNONAA >89 09/15/2015 1525   GFRAA 119 07/10/2018 1154   GFRAA >89 09/15/2015 1525   Lab Results  Component Value Date   HGBA1C 6.0 (H) 07/10/2018   HGBA1C 6.3 (H) 12/06/2017   HGBA1C 6.2 (H) 09/10/2017   HGBA1C 6.1 (H) 06/11/2017   HGBA1C 6.8 (H) 02/04/2017   Lab Results  Component Value Date   INSULIN 13.5  07/10/2018   INSULIN 5.7 09/10/2017   INSULIN 15.5 02/04/2017   CBC    Component Value Date/Time   WBC 10.8 (H) 09/02/2017 1634   RBC 5.25 09/02/2017 1634   HGB 15.8 09/02/2017 1634   HGB 16.0 02/04/2017 1035   HCT 45.4 09/02/2017 1634   HCT 46.1 02/04/2017 1035   PLT 323 09/02/2017 1634   MCV 86.5 09/02/2017 1634   MCV 88 02/04/2017 1035   MCH 30.1 09/02/2017 1634   MCHC 34.8 09/02/2017 1634   RDW 13.2 09/02/2017 1634   RDW 13.4 02/04/2017 1035  LYMPHSABS 1.5 02/04/2017 1035   MONOABS 0.7 08/20/2016 0926   EOSABS 0.1 02/04/2017 1035   BASOSABS 0.1 02/04/2017 1035   Iron/TIBC/Ferritin/ %Sat No results found for: IRON, TIBC, FERRITIN, IRONPCTSAT Lipid Panel     Component Value Date/Time   CHOL 167 02/17/2018 1318   TRIG 65 02/17/2018 1318   HDL 65 02/17/2018 1318   CHOLHDL 2.6 12/13/2016 1632   CHOLHDL 3.0 09/15/2015 1525   VLDL 13 09/15/2015 1525   LDLCALC 89 02/17/2018 1318   Hepatic Function Panel     Component Value Date/Time   PROT 7.0 07/10/2018 1154   ALBUMIN 4.5 07/10/2018 1154   AST 19 07/10/2018 1154   ALT 16 07/10/2018 1154   ALKPHOS 84 07/10/2018 1154   BILITOT 0.3 07/10/2018 1154      Component Value Date/Time   TSH 1.620 02/04/2017 1035   Results for TEION, BALLIN (MRN [id number]) as of 08/06/2018 16:56  Ref. Range 07/10/2018 11:54  Vitamin D, 25-Hydroxy Latest Ref Range: 30.0 - 100.0 ng/mL 37.8   OBESITY BEHAVIORAL INTERVENTION VISIT  Today's visit was #23   Starting weight: 247 lbs Starting date: 02/04/2017 Today's weight: 242 lbs  Today's date: 08/06/2018 Total lbs lost to date: 5    08/06/2018  Height 5\' 4"  (1.626 m)  Weight 242 lb (109.8 kg)  BMI (Calculated) 41.52  BLOOD PRESSURE - SYSTOLIC 417  BLOOD PRESSURE - DIASTOLIC 73   Body Fat % 40.8 %  Total Body Water (lbs) 114.8 lbs   ASK: We discussed the diagnosis of obesity with Wallace Keller today and Ermias agreed to give [name] permission to discuss obesity behavioral modification therapy  today.  ASSESS: Daishaun has the diagnosis of obesity and his BMI today is 41.5. Torrey is in the action stage of change.   ADVISE: Ralpheal was educated on the multiple health risks of obesity as well as the benefit of weight loss to improve his health. He was advised of the need for long term treatment and the importance of lifestyle modifications to improve his current health and to decrease his risk of future health problems.  AGREE: Multiple dietary modification options and treatment options were discussed and  Antoin agreed to follow the recommendations documented in the above note.  ARRANGE: Hymen was educated on the importance of frequent visits to treat obesity as outlined per CMS and USPSTF guidelines and agreed to schedule his next follow up appointment today.  Migdalia Dk, am acting as transcriptionist for Abby Potash, PA-C I, Abby Potash, PA-C have reviewed above note and agree with its content

## 2018-08-20 ENCOUNTER — Other Ambulatory Visit: Payer: Self-pay

## 2018-08-20 ENCOUNTER — Ambulatory Visit (INDEPENDENT_AMBULATORY_CARE_PROVIDER_SITE_OTHER): Payer: BLUE CROSS/BLUE SHIELD | Admitting: Physician Assistant

## 2018-08-20 ENCOUNTER — Encounter (INDEPENDENT_AMBULATORY_CARE_PROVIDER_SITE_OTHER): Payer: Self-pay | Admitting: Physician Assistant

## 2018-08-20 VITALS — BP 117/75 | HR 70 | Temp 98.3°F | Ht 64.0 in | Wt 241.0 lb

## 2018-08-20 DIAGNOSIS — Z6841 Body Mass Index (BMI) 40.0 and over, adult: Secondary | ICD-10-CM | POA: Diagnosis not present

## 2018-08-20 DIAGNOSIS — E559 Vitamin D deficiency, unspecified: Secondary | ICD-10-CM

## 2018-08-21 ENCOUNTER — Other Ambulatory Visit (INDEPENDENT_AMBULATORY_CARE_PROVIDER_SITE_OTHER): Payer: Self-pay

## 2018-08-21 NOTE — Progress Notes (Signed)
Office: [phone]  /  Fax: [phone]   HPI:   Chief Complaint: OBESITY Matthew Garner is here to discuss his progress with his obesity treatment plan. He is on the  follow a lower carbohydrate, vegetable and lean protein rich diet plan and is following his eating plan approximately 95 % of the time. He states he is exercising 0 minutes 0 times per week. Matthew Garner reports that he has been following the low carbohydrate plan and enjoying it. He denies excessive hunger.  His weight is 241 lb (109.3 kg) today and has had a weight loss of 1 pounds over a period of 2 weeks since his last visit. He has lost 5 lbs since starting treatment with [name].  Vitamin D deficiency Matthew Garner has a diagnosis of vitamin D deficiency. He is currently taking vit D and denies nausea, vomiting or muscle weakness.  ASSESSMENT AND PLAN:  Vitamin D deficiency  Class 3 severe obesity with serious comorbidity and body mass index (BMI) of 40.0 to 44.9 in adult, unspecified obesity type (Matthew Garner)  PLAN: Vitamin D Deficiency Matthew Garner was informed that low vitamin D levels contributes to fatigue and are associated with obesity, breast, and colon cancer. He agrees to continue to take prescription Vit D @50 ,000 IU every week and will follow up for routine testing of vitamin D, at least 2-3 times per year. He was informed of the risk of over-replacement of vitamin D and agrees to not increase his dose unless he discusses this with [name] first. Agrees to follow up with our clinic as directed.   I spent > than 50% of the 15 minute visit on counseling as documented in the note.  Obesity Matthew Garner is currently in the action stage of change. As such, his goal is to continue with weight loss efforts He has agreed to follow a lower carbohydrate, vegetable and lean protein rich diet plan Matthew Garner has been instructed to work up to a goal of 150 minutes of combined cardio and strengthening exercise per week for weight loss and overall health benefits. We  discussed the following Behavioral Modification Strategies today: work on meal planning and easy cooking plans and keeping healthy foods in the home.    Matthew Garner has agreed to follow up with our clinic in 3 weeks. He was informed of the importance of frequent follow up visits to maximize his success with intensive lifestyle modifications for his multiple health conditions.  ALLERGIES: No Known Allergies  MEDICATIONS: Current Outpatient Medications on File Prior to Visit  Medication Sig Dispense Refill  . atorvastatin (LIPITOR) 10 MG tablet Take 1 tablet (10 mg total) by mouth daily at 6 PM. 90 tablet 0  . Cholecalciferol (VITAMIN D3) 125 MCG (5000 UT) TABS Take 1 tablet (5,000 Units total) by mouth daily at 12 noon. 30 tablet   . losartan (COZAAR) 50 MG tablet Take 1 tablet (50 mg total) by mouth every morning. 90 tablet 0  . metFORMIN (GLUCOPHAGE) 500 MG tablet Take 1 tablet (500 mg total) by mouth daily with breakfast. 90 tablet 0  . omeprazole (PRILOSEC) 40 MG capsule Take 1 capsule (40 mg total) by mouth daily. 30 capsule 0  . sildenafil (VIAGRA) 100 MG tablet TAKE 1/2 TABLET BY MOUTH DAILY AS NEEDED FOR ERECTILE DYSFUNCTION. 5 tablet 3   Current Facility-Administered Medications on File Prior to Visit  Medication Dose Route Frequency Provider Last Rate Last Dose  . 0.9 %  sodium chloride infusion  500 mL Intravenous Once Ladene Artist, MD  PAST MEDICAL HISTORY: Past Medical History:  Diagnosis Date  . Allergy    environmental  . Anemia   . Back pain   . Diabetes mellitus   . Environmental allergies 11/27/2011  . GERD (gastroesophageal reflux disease)   . Hyperlipidemia   . Hypertension   . Joint pain   . Knee pain, bilateral   . Sebaceous cyst 11/10/2012  . Snores     PAST SURGICAL HISTORY: Past Surgical History:  Procedure Laterality Date  . APPENDECTOMY    . KNEE SURGERY     right  . MASS EXCISION Bilateral 11/24/2012   Procedure: EXCISION SCALP MASS X2;   Surgeon: [name], MD;  Location: [facility];  Service: General;  Laterality: Bilateral;  . SPINE SURGERY  2004   lumb  . UPPER GASTROINTESTINAL ENDOSCOPY      SOCIAL HISTORY: Social History   Tobacco Use  . Smoking status: Never Smoker  . Smokeless tobacco: Never Used  Substance Use Topics  . Alcohol use: No  . Drug use: No    FAMILY HISTORY: Family History  Problem Relation Age of Onset  . Colon cancer Neg Hx   . Colon polyps Neg Hx     ROS: Review of Systems  Constitutional: Positive for weight loss.  Gastrointestinal: Negative for nausea and vomiting.  Musculoskeletal:       Negative for muscle weakness    PHYSICAL EXAM: Blood pressure 117/75, pulse 70, temperature 98.3 F (36.8 C), temperature source Oral, height 5\' 4"  (1.626 m), weight 241 lb (109.3 kg), SpO2 98 %. Body mass index is 41.37 kg/m. Physical Exam Vitals signs reviewed.  Constitutional:      Appearance: Normal appearance. He is obese.  HENT:     Head: Normocephalic.     Nose: Nose normal.  Neck:     Musculoskeletal: Normal range of motion.  Cardiovascular:     Rate and Rhythm: Normal rate.  Pulmonary:     Effort: Pulmonary effort is normal.  Musculoskeletal: Normal range of motion.  Skin:    General: Skin is warm and dry.  Neurological:     Mental Status: He is alert and oriented to person, place, and time.  Psychiatric:        Mood and Affect: Mood normal.        Behavior: Behavior normal.     RECENT LABS AND TESTS: BMET    Component Value Date/Time   NA 139 07/10/2018 1154   K 4.4 07/10/2018 1154   CL 99 07/10/2018 1154   CO2 21 07/10/2018 1154   GLUCOSE 109 (H) 07/10/2018 1154   GLUCOSE 179 (H) 09/02/2017 1634   BUN 18 07/10/2018 1154   CREATININE 0.76 07/10/2018 1154   CREATININE 0.76 09/15/2015 1525   CALCIUM 9.6 07/10/2018 1154   GFRNONAA 103 07/10/2018 1154   GFRNONAA >89 09/15/2015 1525   GFRAA 119 07/10/2018 1154   GFRAA >89 09/15/2015  1525   Lab Results  Component Value Date   HGBA1C 6.0 (H) 07/10/2018   HGBA1C 6.3 (H) 12/06/2017   HGBA1C 6.2 (H) 09/10/2017   HGBA1C 6.1 (H) 06/11/2017   HGBA1C 6.8 (H) 02/04/2017   Lab Results  Component Value Date   INSULIN 13.5 07/10/2018   INSULIN 5.7 09/10/2017   INSULIN 15.5 02/04/2017   CBC    Component Value Date/Time   WBC 10.8 (H) 09/02/2017 1634   RBC 5.25 09/02/2017 1634   HGB 15.8 09/02/2017 1634   HGB 16.0 02/04/2017 1035  HCT 45.4 09/02/2017 1634   HCT 46.1 02/04/2017 1035   PLT 323 09/02/2017 1634   MCV 86.5 09/02/2017 1634   MCV 88 02/04/2017 1035   MCH 30.1 09/02/2017 1634   MCHC 34.8 09/02/2017 1634   RDW 13.2 09/02/2017 1634   RDW 13.4 02/04/2017 1035   LYMPHSABS 1.5 02/04/2017 1035   MONOABS 0.7 08/20/2016 0926   EOSABS 0.1 02/04/2017 1035   BASOSABS 0.1 02/04/2017 1035   Iron/TIBC/Ferritin/ %Sat No results found for: IRON, TIBC, FERRITIN, IRONPCTSAT Lipid Panel     Component Value Date/Time   CHOL 167 02/17/2018 1318   TRIG 65 02/17/2018 1318   HDL 65 02/17/2018 1318   CHOLHDL 2.6 12/13/2016 1632   CHOLHDL 3.0 09/15/2015 1525   VLDL 13 09/15/2015 1525   LDLCALC 89 02/17/2018 1318   Hepatic Function Panel     Component Value Date/Time   PROT 7.0 07/10/2018 1154   ALBUMIN 4.5 07/10/2018 1154   AST 19 07/10/2018 1154   ALT 16 07/10/2018 1154   ALKPHOS 84 07/10/2018 1154   BILITOT 0.3 07/10/2018 1154      Component Value Date/Time   TSH 1.620 02/04/2017 1035     Ref. Range 07/10/2018 11:54  Vitamin D, 25-Hydroxy Latest Ref Range: 30.0 - 100.0 ng/mL 37.8     OBESITY BEHAVIORAL INTERVENTION VISIT  Today's visit was # 24  Starting weight: 247 lbs Starting date: 02/04/17 Today's weight : Weight: 241 lb (109.3 kg)  Today's date: 08/20/18 Total lbs lost to date: 5 lbs At least 15 minutes were spent on discussing the following behavioral intervention visit.   ASK: We discussed the diagnosis of obesity with Matthew Garner today and  Matthew Garner agreed to give [name] permission to discuss obesity behavioral modification therapy today.  ASSESS: Jonquez has the diagnosis of obesity and his BMI today is 41.35 Trayveon is in the action stage of change   ADVISE: Anuar was educated on the multiple health risks of obesity as well as the benefit of weight loss to improve his health. He was advised of the need for long term treatment and the importance of lifestyle modifications to improve his current health and to decrease his risk of future health problems.  AGREE: Multiple dietary modification options and treatment options were discussed and  Gianny agreed to follow the recommendations documented in the above note.  ARRANGE: Ardith was educated on the importance of frequent visits to treat obesity as outlined per CMS and USPSTF guidelines and agreed to schedule his next follow up appointment today.  Matthew Garner, am acting as transcriptionist for Abby Potash, PA-C  I, Abby Potash, PA-C have reviewed above note and agree with its content

## 2018-08-27 ENCOUNTER — Other Ambulatory Visit: Payer: Self-pay | Admitting: Family Medicine

## 2018-08-27 DIAGNOSIS — N529 Male erectile dysfunction, unspecified: Secondary | ICD-10-CM

## 2018-08-27 NOTE — Telephone Encounter (Signed)
Requested medication (s) are due for refill today: yes  Requested medication (s) are on the active medication list: yes  Last refill:  12/06/2017  Future visit scheduled: no  Notes to clinic:  Per protocol unable to refill    Requested Prescriptions  Pending Prescriptions Disp Refills   sildenafil (VIAGRA) 100 MG tablet [Pharmacy Med Name: SILDENAFIL 100MG  TABLETS] 5 tablet 3    Sig: TAKE 1/2 TABLET BY MOUTH DAILY AS NEEDED FOR ERECTILE DYSFUNCTION.     Urology: Erectile Dysfunction Agents Passed - 08/27/2018  3:44 AM      Passed - Last BP in normal range    BP Readings from Last 1 Encounters:  08/20/18 117/75         Passed - Valid encounter within last 12 months    Recent Outpatient Visits          8 months ago Erectile dysfunction, unspecified erectile dysfunction type   [facility], Ranell Patrick, MD   1 year ago Annual physical exam   [facility], Ranell Patrick, MD   1 year ago Essential hypertension   [facility], Ranell Patrick, MD   1 year ago Hyperlipidemia, unspecified hyperlipidemia type   [facility], Ranell Patrick, MD   1 year ago Type 2 diabetes mellitus without complication, without long-term current use of insulin [facility])   [facility], Ranell Patrick, MD

## 2018-09-01 ENCOUNTER — Ambulatory Visit (INDEPENDENT_AMBULATORY_CARE_PROVIDER_SITE_OTHER): Payer: BLUE CROSS/BLUE SHIELD | Admitting: Bariatrics

## 2018-09-11 ENCOUNTER — Other Ambulatory Visit: Payer: Self-pay

## 2018-09-11 ENCOUNTER — Encounter (INDEPENDENT_AMBULATORY_CARE_PROVIDER_SITE_OTHER): Payer: Self-pay | Admitting: Physician Assistant

## 2018-09-11 ENCOUNTER — Ambulatory Visit (INDEPENDENT_AMBULATORY_CARE_PROVIDER_SITE_OTHER): Payer: BLUE CROSS/BLUE SHIELD | Admitting: Physician Assistant

## 2018-09-11 VITALS — BP 130/78 | HR 75 | Temp 98.1°F | Ht 64.0 in | Wt 238.0 lb

## 2018-09-11 DIAGNOSIS — E559 Vitamin D deficiency, unspecified: Secondary | ICD-10-CM | POA: Diagnosis not present

## 2018-09-11 DIAGNOSIS — Z9189 Other specified personal risk factors, not elsewhere classified: Secondary | ICD-10-CM | POA: Diagnosis not present

## 2018-09-11 DIAGNOSIS — I10 Essential (primary) hypertension: Secondary | ICD-10-CM | POA: Diagnosis not present

## 2018-09-11 DIAGNOSIS — E119 Type 2 diabetes mellitus without complications: Secondary | ICD-10-CM

## 2018-09-11 DIAGNOSIS — E7849 Other hyperlipidemia: Secondary | ICD-10-CM

## 2018-09-11 DIAGNOSIS — K219 Gastro-esophageal reflux disease without esophagitis: Secondary | ICD-10-CM

## 2018-09-11 DIAGNOSIS — E66813 Obesity, class 3: Secondary | ICD-10-CM

## 2018-09-11 DIAGNOSIS — Z6841 Body Mass Index (BMI) 40.0 and over, adult: Secondary | ICD-10-CM

## 2018-09-11 MED ORDER — ATORVASTATIN CALCIUM 10 MG PO TABS: 10.0000 mg | ORAL_TABLET | Freq: Every day | ORAL | 0 refills | Status: DC

## 2018-09-11 MED ORDER — METFORMIN HCL 500 MG PO TABS: 500.0000 mg | ORAL_TABLET | Freq: Every day | ORAL | 0 refills | Status: DC

## 2018-09-11 MED ORDER — OMEPRAZOLE 40 MG PO CPDR: 40.0000 mg | DELAYED_RELEASE_CAPSULE | Freq: Every day | ORAL | 0 refills | Status: DC

## 2018-09-11 MED ORDER — VITAMIN D (ERGOCALCIFEROL) 1.25 MG (50000 UNIT) PO CAPS: 50000.0000 [IU] | ORAL_CAPSULE | ORAL | 0 refills | Status: DC

## 2018-09-11 MED ORDER — LOSARTAN POTASSIUM 50 MG PO TABS: 50.0000 mg | ORAL_TABLET | Freq: Every morning | ORAL | 0 refills | Status: DC

## 2018-09-15 NOTE — Progress Notes (Signed)
Office: [phone]  /  Fax: [phone]   HPI:   Chief Complaint: OBESITY Matthew Garner is here to discuss his progress with his obesity treatment plan. He is on the lower carbohydrate, vegetable and lean protein rich diet plan and is following his eating plan approximately 90 to 95 % of the time. He states he is exercising 0 minutes 0 times per week. Matthew Garner reports that he is enjoying the low carb plan and he wants to stick with it. He is still eating tomatoes. His weight is 238 lb (108 kg) today and has had a weight loss of 3 pounds over a period of 3 weeks since his last visit. He has lost 9 lbs since starting treatment with [name].  Hyperlipidemia Matthew Garner has hyperlipidemia and he is on Lipitor. Matthew Garner has been trying to improve his cholesterol levels with intensive lifestyle modification including a low saturated fat diet, exercise and weight loss. He denies any chest pain.  Vitamin D deficiency Matthew Garner has a diagnosis of vitamin D deficiency. He is currently taking vit D and denies nausea, vomiting or muscle weakness.  Hypertension Matthew Garner is a 58 y.o. male with hypertension. He is on Losartan. Matthew Garner denies chest pain. He is working weight loss to help control his blood pressure with the goal of decreasing his risk of heart attack and stroke. Matthew Garner blood pressure is normal.  GERD (gastroesophageal reflux disease) Matthew Garner has a diagnosis of gastroesophageal reflux disease and he has no symptoms currently. He is on Prilosec.  Diabetes II Matthew Garner has a diagnosis of diabetes type II. Matthew Garner states fasting BGs range between 122 and 140 and he denies nausea, vomiting, diarrhea or polyphagia. Last A1c was at 6.0 He has been working on intensive lifestyle modifications including diet, exercise, and weight loss to help control his blood glucose levels.  At risk for cardiovascular disease Matthew Garner is at a higher than average risk for cardiovascular disease due to obesity, hyperlipidemia, hypertension and  diabetes. He currently denies any chest pain.  ASSESSMENT AND PLAN:  Other hyperlipidemia - Plan: atorvastatin (LIPITOR) 10 MG tablet  Vitamin D deficiency - Plan: Vitamin D, Ergocalciferol, (DRISDOL) 1.25 MG (50000 UT) CAPS capsule  Essential hypertension - Plan: losartan (COZAAR) 50 MG tablet  Gastroesophageal reflux disease, esophagitis presence not specified - Plan: omeprazole (PRILOSEC) 40 MG capsule  Type 2 diabetes mellitus without complication, without long-term current use of insulin (HCC) - Plan: metFORMIN (GLUCOPHAGE) 500 MG tablet  At risk for heart disease  Class 3 severe obesity with serious comorbidity and body mass index (BMI) of 40.0 to 44.9 in adult, unspecified obesity type ([place])  PLAN:  Hyperlipidemia Matthew Garner was informed of the American Heart Association Guidelines emphasizing intensive lifestyle modifications as the first line treatment for hyperlipidemia. We discussed many lifestyle modifications today in depth, and Matthew Garner will continue to work on decreasing saturated fats such as fatty red meat, butter and many fried foods. He will also increase vegetables and lean protein in his diet and continue to work on exercise and weight loss efforts. Matthew Garner agrees to continue Lipitor 10 mg daily #90 with no refills and follow up as directed.  Vitamin D Deficiency Matthew Garner was informed that low vitamin D levels contributes to fatigue and are associated with obesity, breast, and colon cancer. Matthew Garner agrees to continue to take prescription Vit D @50 ,000 IU every week #4 with no refills and he will follow up for routine testing of vitamin D, at least 2-3 times per year. He was informed  of the risk of over-replacement of vitamin D and agrees to not increase his dose unless he discusses this with [name] first. Matthew Garner agrees to follow up as directed.  Hypertension We discussed sodium restriction, working on healthy weight loss, and a regular exercise program as the means to achieve improved  blood pressure control. Matthew Garner agreed with this plan and agreed to follow up as directed. We will continue to monitor his blood pressure as well as his progress with the above lifestyle modifications. He agrees to continue Losartan 50 mg daily #90 with no refills and he will watch for signs of hypotension as he continues his lifestyle modifications.  GERD (gastroesophageal reflux disease) Matthew Garner agrees to continue Prilosec 40 mg daily #30 with no refills and follow up as directed.  Diabetes II Matthew Garner has been given extensive diabetes education by myself today including ideal fasting and post-prandial blood glucose readings, individual ideal Hgb A1c goals and hypoglycemia prevention. We discussed the importance of good blood sugar control to decrease the likelihood of diabetic complications such as nephropathy, neuropathy, limb loss, blindness, coronary artery disease, and death. We discussed the importance of intensive lifestyle modification including diet, exercise and weight loss as the first line treatment for diabetes. Matthew Garner agrees to continue metformin 500 mg daily with breakfast #90 with no refills and follow up at the agreed upon time.  Cardiovascular risk counseling Matthew Garner was given extended (15 minutes) coronary artery disease prevention counseling today. He is 58 y.o. male and has risk factors for heart disease including obesity, hyperlipidemia, hypertension and diabetes. We discussed intensive lifestyle modifications today with an emphasis on specific weight loss instructions and strategies. Pt was also informed of the importance of increasing exercise and decreasing saturated fats to help prevent heart disease.  Obesity Matthew Garner is currently in the action stage of change. As such, his goal is to continue with weight loss efforts He has agreed to follow a lower carbohydrate, vegetable and lean protein rich diet plan Matthew Garner has been instructed to work up to a goal of 150 minutes of combined cardio  and strengthening exercise per week for weight loss and overall health benefits. We discussed the following Behavioral Modification Strategies today: keeping healthy foods in the home and work on meal planning and easy cooking plans  Matthew Garner has agreed to follow up with our clinic in 4 weeks. He was informed of the importance of frequent follow up visits to maximize his success with intensive lifestyle modifications for his multiple health conditions.  ALLERGIES: No Known Allergies  MEDICATIONS: Current Outpatient Medications on File Prior to Visit  Medication Sig Dispense Refill   sildenafil (VIAGRA) 100 MG tablet TAKE 1/2 TABLET BY MOUTH DAILY AS NEEDED FOR ERECTILE DYSFUNCTION. 5 tablet 3   Current Facility-Administered Medications on File Prior to Visit  Medication Dose Route Frequency Provider Last Rate Last Dose   0.9 %  sodium chloride infusion  500 mL Intravenous Once Ladene Artist, MD        PAST MEDICAL HISTORY: Past Medical History:  Diagnosis Date   Allergy    environmental   Anemia    Back pain    Diabetes mellitus    Environmental allergies 11/27/2011   GERD (gastroesophageal reflux disease)    Hyperlipidemia    Hypertension    Joint pain    Knee pain, bilateral    Sebaceous cyst 11/10/2012   Snores     PAST SURGICAL HISTORY: Past Surgical History:  Procedure Laterality Date   APPENDECTOMY  KNEE SURGERY     right   MASS EXCISION Bilateral 11/24/2012   Procedure: EXCISION SCALP MASS X2;  Surgeon: [name], MD;  Location: [facility];  Service: General;  Laterality: Bilateral;   SPINE SURGERY  2004   lumb   UPPER GASTROINTESTINAL ENDOSCOPY      SOCIAL HISTORY: Social History   Tobacco Use   Smoking status: Never Smoker   Smokeless tobacco: Never Used  Substance Use Topics   Alcohol use: No   Drug use: No    FAMILY HISTORY: Family History  Problem Relation Age of Onset   Colon cancer Neg Hx      Colon polyps Neg Hx     ROS: Review of Systems  Constitutional: Positive for weight loss.  Cardiovascular: Negative for chest pain.  Gastrointestinal: Negative for diarrhea, heartburn, nausea and vomiting.  Musculoskeletal:       Negative for muscle weakness  Endo/Heme/Allergies:       Negative for polyphagia    PHYSICAL EXAM: Blood pressure 130/78, pulse 75, temperature 98.1 F (36.7 C), temperature source Oral, height 5\' 4"  (1.626 m), weight 238 lb (108 kg), SpO2 99 %. Body mass index is 40.85 kg/m. Physical Exam Vitals signs reviewed.  Constitutional:      Appearance: Normal appearance. He is well-developed. He is obese.  Cardiovascular:     Rate and Rhythm: Normal rate.  Pulmonary:     Effort: Pulmonary effort is normal.  Musculoskeletal: Normal range of motion.  Skin:    General: Skin is warm and dry.  Neurological:     Mental Status: He is alert and oriented to person, place, and time.  Psychiatric:        Mood and Affect: Mood normal.        Behavior: Behavior normal.     RECENT LABS AND TESTS: BMET    Component Value Date/Time   NA 139 07/10/2018 1154   K 4.4 07/10/2018 1154   CL 99 07/10/2018 1154   CO2 21 07/10/2018 1154   GLUCOSE 109 (H) 07/10/2018 1154   GLUCOSE 179 (H) 09/02/2017 1634   BUN 18 07/10/2018 1154   CREATININE 0.76 07/10/2018 1154   CREATININE 0.76 09/15/2015 1525   CALCIUM 9.6 07/10/2018 1154   GFRNONAA 103 07/10/2018 1154   GFRNONAA >89 09/15/2015 1525   GFRAA 119 07/10/2018 1154   GFRAA >89 09/15/2015 1525   Lab Results  Component Value Date   HGBA1C 6.0 (H) 07/10/2018   HGBA1C 6.3 (H) 12/06/2017   HGBA1C 6.2 (H) 09/10/2017   HGBA1C 6.1 (H) 06/11/2017   HGBA1C 6.8 (H) 02/04/2017   Lab Results  Component Value Date   INSULIN 13.5 07/10/2018   INSULIN 5.7 09/10/2017   INSULIN 15.5 02/04/2017   CBC    Component Value Date/Time   WBC 10.8 (H) 09/02/2017 1634   RBC 5.25 09/02/2017 1634   HGB 15.8 09/02/2017 1634    HGB 16.0 02/04/2017 1035   HCT 45.4 09/02/2017 1634   HCT 46.1 02/04/2017 1035   PLT 323 09/02/2017 1634   MCV 86.5 09/02/2017 1634   MCV 88 02/04/2017 1035   MCH 30.1 09/02/2017 1634   MCHC 34.8 09/02/2017 1634   RDW 13.2 09/02/2017 1634   RDW 13.4 02/04/2017 1035   LYMPHSABS 1.5 02/04/2017 1035   MONOABS 0.7 08/20/2016 0926   EOSABS 0.1 02/04/2017 1035   BASOSABS 0.1 02/04/2017 1035   Iron/TIBC/Ferritin/ %Sat No results found for: IRON, TIBC, FERRITIN, IRONPCTSAT Lipid Panel  Component Value Date/Time   CHOL 167 02/17/2018 1318   TRIG 65 02/17/2018 1318   HDL 65 02/17/2018 1318   CHOLHDL 2.6 12/13/2016 1632   CHOLHDL 3.0 09/15/2015 1525   VLDL 13 09/15/2015 1525   LDLCALC 89 02/17/2018 1318   Hepatic Function Panel     Component Value Date/Time   PROT 7.0 07/10/2018 1154   ALBUMIN 4.5 07/10/2018 1154   AST 19 07/10/2018 1154   ALT 16 07/10/2018 1154   ALKPHOS 84 07/10/2018 1154   BILITOT 0.3 07/10/2018 1154      Component Value Date/Time   TSH 1.620 02/04/2017 1035     Ref. Range 07/10/2018 11:54  Vitamin D, 25-Hydroxy Latest Ref Range: 30.0 - 100.0 ng/mL 37.8    OBESITY BEHAVIORAL INTERVENTION VISIT  Today's visit was # 25  Starting weight: 247 lbs Starting date: 02/04/2017 Today's weight : 238 lbs Today's date: 09/11/2018 Total lbs lost to date: 9    09/11/2018  Height 5\' 4"  (1.626 m)  Weight 238 lb (108 kg)  BMI (Calculated) 40.83  BLOOD PRESSURE - SYSTOLIC [id number]  BLOOD PRESSURE - DIASTOLIC 78   Body Fat % [id number] %  Total Body Water (lbs) 114.2 lbs    ASK: We discussed the diagnosis of obesity with Matthew Garner today and Matthew Garner agreed to give [name] permission to discuss obesity behavioral modification therapy today.  ASSESS: Matthew Garner has the diagnosis of obesity and his BMI today is 40.83 Matthew Garner is in the action stage of change   ADVISE: Matthew Garner was educated on the multiple health risks of obesity as well as the benefit of weight loss to improve his  health. He was advised of the need for long term treatment and the importance of lifestyle modifications to improve his current health and to decrease his risk of future health problems.  AGREE: Multiple dietary modification options and treatment options were discussed and  Matthew Garner agreed to follow the recommendations documented in the above note.  ARRANGE: Matthew Garner was educated on the importance of frequent visits to treat obesity as outlined per CMS and USPSTF guidelines and agreed to schedule his next follow up appointment today.  Corey Skains, am acting as transcriptionist for Abby Potash, PA-C I, Abby Potash, PA-C have reviewed above note and agree with its content

## 2018-10-09 ENCOUNTER — Ambulatory Visit (INDEPENDENT_AMBULATORY_CARE_PROVIDER_SITE_OTHER): Payer: BLUE CROSS/BLUE SHIELD | Admitting: Physician Assistant

## 2018-10-09 ENCOUNTER — Other Ambulatory Visit: Payer: Self-pay

## 2018-10-09 VITALS — BP 120/78 | HR 67 | Temp 98.4°F | Ht 64.0 in | Wt 242.0 lb

## 2018-10-09 DIAGNOSIS — Z9189 Other specified personal risk factors, not elsewhere classified: Secondary | ICD-10-CM

## 2018-10-09 DIAGNOSIS — K219 Gastro-esophageal reflux disease without esophagitis: Secondary | ICD-10-CM

## 2018-10-09 DIAGNOSIS — E119 Type 2 diabetes mellitus without complications: Secondary | ICD-10-CM | POA: Diagnosis not present

## 2018-10-09 DIAGNOSIS — Z6841 Body Mass Index (BMI) 40.0 and over, adult: Secondary | ICD-10-CM

## 2018-10-09 MED ORDER — OMEPRAZOLE 40 MG PO CPDR: 40.0000 mg | DELAYED_RELEASE_CAPSULE | Freq: Every day | ORAL | 0 refills | Status: DC

## 2018-10-14 NOTE — Progress Notes (Signed)
Office: [phone]  /  Fax: [phone]   HPI:   Chief Complaint: OBESITY Matthew Garner is here to discuss his progress with his obesity treatment plan. He is on the lower carbohydrate, vegetable and lean protein rich diet plan and is following his eating plan approximately 95 % of the time. He states he is exercising 0 minutes 0 times per week. Matthew Garner reports that he has been skipping dinner due to being so tired from work. He denies snoring while sleeping, but he reports excessive daytime sleepiness. His weight is 242 lb (109.8 kg) today and has had a weight gain of 4 pounds over a period of 4 weeks since his last visit. He has lost 5 lbs since starting treatment with [name].  Diabetes II Matthew Garner has a diagnosis of diabetes type II. He is on metformin currently. Matthew Garner states fasting BGs range between 115 and 130 and he denies any hypoglycemic episodes. Last A1c was at 6.0 He has been working on intensive lifestyle modifications including diet, exercise, and weight loss to help control his blood glucose levels.  GERD (gastroesophageal reflux disease) Matthew Garner has a diagnosis of gastroesophageal reflux disease. He has no current symptoms on Omeprazole. He has no black stools.  At risk for cardiovascular disease Matthew Garner is at a higher than average risk for cardiovascular disease due to obesity and diabetes. He currently denies any chest pain.  ASSESSMENT AND PLAN:  Type 2 diabetes mellitus without complication, without long-term current use of insulin (HCC)  Gastroesophageal reflux disease, unspecified whether esophagitis present  At risk for heart disease  Class 3 severe obesity with serious comorbidity and body mass index (BMI) of 40.0 to 44.9 in adult, unspecified obesity type (HCC)  Gastroesophageal reflux disease - Plan: omeprazole (PRILOSEC) 40 MG capsule  PLAN:  Diabetes II Matthew Garner has been given extensive diabetes education by myself today including ideal fasting and post-prandial blood  glucose readings, individual ideal Hgb A1c goals and hypoglycemia prevention. We discussed the importance of good blood sugar control to decrease the likelihood of diabetic complications such as nephropathy, neuropathy, limb loss, blindness, coronary artery disease, and death. We discussed the importance of intensive lifestyle modification including diet, exercise and weight loss as the first line treatment for diabetes. Matthew Garner agrees to continue with medications and weight loss and he will follow up at the agreed upon time.  GERD (gastroesophageal reflux disease) Matthew Garner agrees to continue Omeprazole 40 mg daily #30 with no refills and follow up as directed.  Cardiovascular risk counseling Matthew Garner was given extended (15 minutes) coronary artery disease prevention counseling today. He is 58 y.o. male and has risk factors for heart disease including obesity and diabetes. We discussed intensive lifestyle modifications today with an emphasis on specific weight loss instructions and strategies. Pt was also informed of the importance of increasing exercise and decreasing saturated fats to help prevent heart disease.  Obesity Matthew Garner is currently in the action stage of change. As such, his goal is to continue with weight loss efforts He has agreed to follow a lower carbohydrate, vegetable and lean protein rich diet plan Matthew Garner has been instructed to work up to a goal of 150 minutes of combined cardio and strengthening exercise per week for weight loss and overall health benefits. We discussed the following Behavioral Modification Strategies today: keeping healthy foods in the home and work on meal planning and easy cooking plans  Matthew Garner has agreed to follow up with our clinic in 3 weeks. He was informed of the importance of  frequent follow up visits to maximize his success with intensive lifestyle modifications for his multiple health conditions.  ALLERGIES: No Known Allergies  MEDICATIONS: Current Outpatient  Medications on File Prior to Visit  Medication Sig Dispense Refill   atorvastatin (LIPITOR) 10 MG tablet Take 1 tablet (10 mg total) by mouth daily at 6 PM. 90 tablet 0   losartan (COZAAR) 50 MG tablet Take 1 tablet (50 mg total) by mouth every morning. 90 tablet 0   metFORMIN (GLUCOPHAGE) 500 MG tablet Take 1 tablet (500 mg total) by mouth daily with breakfast. 90 tablet 0   sildenafil (VIAGRA) 100 MG tablet TAKE 1/2 TABLET BY MOUTH DAILY AS NEEDED FOR ERECTILE DYSFUNCTION. 5 tablet 3   Vitamin D, Ergocalciferol, (DRISDOL) 1.25 MG (50000 UT) CAPS capsule Take 1 capsule (50,000 Units total) by mouth every 7 (seven) days. 4 capsule 0   Current Facility-Administered Medications on File Prior to Visit  Medication Dose Route Frequency Provider Last Rate Last Dose   0.9 %  sodium chloride infusion  500 mL Intravenous Once Matthew Artist, MD        PAST MEDICAL HISTORY: Past Medical History:  Diagnosis Date   Allergy    environmental   Anemia    Back pain    Diabetes mellitus    Environmental allergies 11/27/2011   GERD (gastroesophageal reflux disease)    Hyperlipidemia    Hypertension    Joint pain    Knee pain, bilateral    Sebaceous cyst 11/10/2012   Snores     PAST SURGICAL HISTORY: Past Surgical History:  Procedure Laterality Date   APPENDECTOMY     KNEE SURGERY     right   MASS EXCISION Bilateral 11/24/2012   Procedure: EXCISION SCALP MASS X2;  Surgeon: [name], MD;  Location: [facility];  Service: General;  Laterality: Bilateral;   SPINE SURGERY  2004   lumb   UPPER GASTROINTESTINAL ENDOSCOPY      SOCIAL HISTORY: Social History   Tobacco Use   Smoking status: Never Smoker   Smokeless tobacco: Never Used  Substance Use Topics   Alcohol use: No   Drug use: No    FAMILY HISTORY: Family History  Problem Relation Age of Onset   Colon cancer Neg Hx    Colon polyps Neg Hx     ROS: Review of Systems    Constitutional: Negative for weight loss.  Gastrointestinal: Negative for diarrhea, melena, nausea and vomiting.  Endo/Heme/Allergies:       Negative for hypoglycemia    PHYSICAL EXAM: Blood pressure 120/78, pulse 67, temperature 98.4 F (36.9 C), temperature source Oral, height 5\' 4"  (1.626 m), weight 242 lb (109.8 kg), SpO2 98 %. Body mass index is 41.54 kg/m. Physical Exam Vitals signs reviewed.  Constitutional:      Appearance: Normal appearance. He is well-developed. He is obese.  Cardiovascular:     Rate and Rhythm: Normal rate.  Pulmonary:     Effort: Pulmonary effort is normal.  Musculoskeletal: Normal range of motion.  Skin:    General: Skin is warm and dry.  Neurological:     Mental Status: He is alert and oriented to person, place, and time.  Psychiatric:        Mood and Affect: Mood normal.        Behavior: Behavior normal.     RECENT LABS AND TESTS: BMET    Component Value Date/Time   NA 139 07/10/2018 1154   K 4.4  07/10/2018 1154   CL 99 07/10/2018 1154   CO2 21 07/10/2018 1154   GLUCOSE 109 (H) 07/10/2018 1154   GLUCOSE 179 (H) 09/02/2017 1634   BUN 18 07/10/2018 1154   CREATININE 0.76 07/10/2018 1154   CREATININE 0.76 09/15/2015 1525   CALCIUM 9.6 07/10/2018 1154   GFRNONAA 103 07/10/2018 1154   GFRNONAA >89 09/15/2015 1525   GFRAA 119 07/10/2018 1154   GFRAA >89 09/15/2015 1525   Lab Results  Component Value Date   HGBA1C 6.0 (H) 07/10/2018   HGBA1C 6.3 (H) 12/06/2017   HGBA1C 6.2 (H) 09/10/2017   HGBA1C 6.1 (H) 06/11/2017   HGBA1C 6.8 (H) 02/04/2017   Lab Results  Component Value Date   INSULIN 13.5 07/10/2018   INSULIN 5.7 09/10/2017   INSULIN 15.5 02/04/2017   CBC    Component Value Date/Time   WBC 10.8 (H) 09/02/2017 1634   RBC 5.25 09/02/2017 1634   HGB 15.8 09/02/2017 1634   HGB 16.0 02/04/2017 1035   HCT 45.4 09/02/2017 1634   HCT 46.1 02/04/2017 1035   PLT 323 09/02/2017 1634   MCV 86.5 09/02/2017 1634   MCV 88  02/04/2017 1035   MCH 30.1 09/02/2017 1634   MCHC 34.8 09/02/2017 1634   RDW 13.2 09/02/2017 1634   RDW 13.4 02/04/2017 1035   LYMPHSABS 1.5 02/04/2017 1035   MONOABS 0.7 08/20/2016 0926   EOSABS 0.1 02/04/2017 1035   BASOSABS 0.1 02/04/2017 1035   Iron/TIBC/Ferritin/ %Sat No results found for: IRON, TIBC, FERRITIN, IRONPCTSAT Lipid Panel     Component Value Date/Time   CHOL 167 02/17/2018 1318   TRIG 65 02/17/2018 1318   HDL 65 02/17/2018 1318   CHOLHDL 2.6 12/13/2016 1632   CHOLHDL 3.0 09/15/2015 1525   VLDL 13 09/15/2015 1525   LDLCALC 89 02/17/2018 1318   Hepatic Function Panel     Component Value Date/Time   PROT 7.0 07/10/2018 1154   ALBUMIN 4.5 07/10/2018 1154   AST 19 07/10/2018 1154   ALT 16 07/10/2018 1154   ALKPHOS 84 07/10/2018 1154   BILITOT 0.3 07/10/2018 1154      Component Value Date/Time   TSH 1.620 02/04/2017 1035     Ref. Range 07/10/2018 11:54  Vitamin D, 25-Hydroxy Latest Ref Range: 30.0 - 100.0 ng/mL 37.8    OBESITY BEHAVIORAL INTERVENTION VISIT  Today's visit was # 26  Starting weight: 247 lbs Starting date: 02/04/2017 Today's weight : 242 lbs  Today's date: 10/09/2018 Total lbs lost to date: 5    10/09/2018  Height 5\' 4"  (1.626 m)  Weight 242 lb (109.8 kg)  BMI (Calculated) 41.52  BLOOD PRESSURE - SYSTOLIC [id number]  BLOOD PRESSURE - DIASTOLIC 78   Body Fat % [id number] %    ASK: We discussed the diagnosis of obesity with Wallace Keller today and [name] agreed to give [name] permission to discuss obesity behavioral modification therapy today.  ASSESS: Fredi has the diagnosis of obesity and his BMI today is 41.52 Flavil is in the action stage of change   ADVISE: Avian was educated on the multiple health risks of obesity as well as the benefit of weight loss to improve his health. He was advised of the need for long term treatment and the importance of lifestyle modifications to improve his current health and to decrease his risk of future health  problems.  AGREE: Multiple dietary modification options and treatment options were discussed and  Jaelen agreed to follow the recommendations documented in the above note.  ARRANGE: Kashis was educated on the importance of frequent visits to treat obesity as outlined per CMS and USPSTF guidelines and agreed to schedule his next follow up appointment today.  Corey Skains, am acting as transcriptionist for Abby Potash, PA-C I, Abby Potash, PA-C have reviewed above note and agree with its content

## 2018-10-29 ENCOUNTER — Other Ambulatory Visit: Payer: Self-pay

## 2018-10-29 ENCOUNTER — Ambulatory Visit (INDEPENDENT_AMBULATORY_CARE_PROVIDER_SITE_OTHER): Payer: BLUE CROSS/BLUE SHIELD | Admitting: Physician Assistant

## 2018-10-29 VITALS — BP 121/76 | HR 61 | Temp 98.2°F | Ht 64.0 in | Wt 241.0 lb

## 2018-10-29 DIAGNOSIS — E559 Vitamin D deficiency, unspecified: Secondary | ICD-10-CM

## 2018-10-29 DIAGNOSIS — Z9189 Other specified personal risk factors, not elsewhere classified: Secondary | ICD-10-CM | POA: Diagnosis not present

## 2018-10-29 DIAGNOSIS — E7849 Other hyperlipidemia: Secondary | ICD-10-CM

## 2018-10-29 DIAGNOSIS — E119 Type 2 diabetes mellitus without complications: Secondary | ICD-10-CM | POA: Diagnosis not present

## 2018-10-29 DIAGNOSIS — Z6841 Body Mass Index (BMI) 40.0 and over, adult: Secondary | ICD-10-CM

## 2018-10-30 LAB — COMPREHENSIVE METABOLIC PANEL
ALT: 18 IU/L (ref 0–44)
AST: 16 IU/L (ref 0–40)
Albumin/Globulin Ratio: 1.5 (ref 1.2–2.2)
Albumin: 4.3 g/dL (ref 3.8–4.9)
Alkaline Phosphatase: 92 IU/L (ref 39–117)
BUN/Creatinine Ratio: 22 — ABNORMAL HIGH (ref 9–20)
BUN: 15 mg/dL (ref 6–24)
Bilirubin Total: 0.4 mg/dL (ref 0.0–1.2)
CO2: 26 mmol/L (ref 20–29)
Calcium: 9.5 mg/dL (ref 8.7–10.2)
Chloride: 99 mmol/L (ref 96–106)
Creatinine, Ser: 0.68 mg/dL — ABNORMAL LOW (ref 0.76–1.27)
GFR calc Af Amer: 124 mL/min/{1.73_m2} (ref >59)
GFR calc non Af Amer: 108 mL/min/{1.73_m2} (ref >59)
Globulin, Total: 2.9 g/dL (ref 1.5–4.5)
Glucose: 109 mg/dL — ABNORMAL HIGH (ref 65–99)
Potassium: 4.7 mmol/L (ref 3.5–5.2)
Sodium: 139 mmol/L (ref 134–144)
Total Protein: 7.2 g/dL (ref 6.0–8.5)

## 2018-10-30 LAB — LIPID PANEL WITH LDL/HDL RATIO
Cholesterol, Total: 175 mg/dL (ref 100–199)
HDL: 61 mg/dL (ref >39)
LDL Chol Calc (NIH): 101 mg/dL — ABNORMAL HIGH (ref 0–99)
LDL/HDL Ratio: 1.7 ratio (ref 0.0–3.6)
Triglycerides: 67 mg/dL (ref 0–149)
VLDL Cholesterol Cal: 13 mg/dL (ref 5–40)

## 2018-10-30 LAB — VITAMIN D 25 HYDROXY (VIT D DEFICIENCY, FRACTURES): Vit D, 25-Hydroxy: 32.7 ng/mL (ref 30.0–100.0)

## 2018-10-30 LAB — HEMOGLOBIN A1C
Est. average glucose Bld gHb Est-mCnc: 137 mg/dL
Hgb A1c MFr Bld: 6.4 % — ABNORMAL HIGH (ref 4.8–5.6)

## 2018-10-30 LAB — INSULIN, RANDOM: INSULIN: 11.1 u[IU]/mL (ref 2.6–24.9)

## 2018-10-30 NOTE — Progress Notes (Signed)
Office: [phone]  /  Fax: [phone]   HPI:   Chief Complaint: OBESITY Matthew Garner is here to discuss his progress with his obesity treatment plan. He is on the lower carbohydrate, vegetable and lean protein rich diet plan and is following his eating plan approximately 95 % of the time. He states he is exercising 0 minutes 0 times per week. Matthew Garner reports that he has been following the plan well. He is working hard and is stressed from work. His weight is 241 lb (109.3 kg) today and has had a weight loss of 1 pound over a period of 3 weeks since his last visit. He has lost 6 lbs since starting treatment with Matthew Garner.  Hyperlipidemia Matthew Garner has hyperlipidemia and he is on Atorvastatin. He has been trying to improve his cholesterol levels with intensive lifestyle modification including a low saturated fat diet, exercise and weight loss. He denies any chest pain.  Diabetes II Matthew Garner has a diagnosis of diabetes type II. Matthew Garner is on Metformin and he denies nausea, vomiting, diarrhea or any hypoglycemic episodes. He has been working on intensive lifestyle modifications including diet, exercise, and weight loss to help control his blood glucose levels. Matthew Garner denies polyphagia.  At risk for cardiovascular disease Matthew Garner is at a higher than average risk for cardiovascular disease due to obesity, hyperlipidemia and diabetes. He currently denies any chest pain.  Vitamin D deficiency Matthew Garner has a diagnosis of vitamin D deficiency. Matthew Garner is currently taking vit D and he denies nausea, vomiting or muscle weakness.  ASSESSMENT AND PLAN:  Other hyperlipidemia - Plan: Lipid Panel With LDL/HDL Ratio  Type 2 diabetes mellitus without complication, without long-term current use of insulin (HCC) - Plan: Comprehensive metabolic panel, Hemoglobin A1c, Insulin, random  Vitamin D deficiency - Plan: VITAMIN D 25 Hydroxy (Vit-D Deficiency, Fractures)  At risk for heart disease  Class 3 severe obesity with serious  comorbidity and body mass index (BMI) of 40.0 to 44.9 in adult, unspecified obesity type (Matthew Garner)  PLAN:  Hyperlipidemia Matthew Garner was informed of the American Heart Association Guidelines emphasizing intensive lifestyle modifications as the first line treatment for hyperlipidemia. We discussed many lifestyle modifications today in depth, and Matthew Garner will continue to work on decreasing saturated fats such as fatty red meat, butter and many fried foods. He will also increase vegetables and lean protein in his diet and continue to work on exercise and weight loss efforts. Matthew Garner will continue Atorvastatin and follow up as directed.  Diabetes II Matthew Garner has been given extensive diabetes education by myself today including ideal fasting and post-prandial blood glucose readings, individual ideal Hgb A1c goals and hypoglycemia prevention. We discussed the importance of good blood sugar control to decrease the likelihood of diabetic complications such as nephropathy, neuropathy, limb loss, blindness, coronary artery disease, and death. We discussed the importance of intensive lifestyle modification including diet, exercise and weight loss as the first line treatment for diabetes. We will check labs and Matthew Garner will continue with medications and weight loss and  follow up at the agreed upon time.  Cardiovascular risk counseling Matthew Garner was given extended (15 minutes) coronary artery disease prevention counseling today. He is 58 y.o. male and has risk factors for heart disease including obesity, hyperlipidemia and diabetes. We discussed intensive lifestyle modifications today with an emphasis on specific weight loss instructions and strategies. Pt was also informed of the importance of increasing exercise and decreasing saturated fats to help prevent heart disease.  Vitamin D Deficiency Matthew Garner was informed that  low vitamin D levels contributes to fatigue and are associated with obesity, breast, and colon cancer. Matthew Garner will  continue to take prescription Vit D @50 ,000 IU every week and he will follow up for routine testing of vitamin D, at least 2-3 times per year. He was informed of the risk of over-replacement of vitamin D and agrees to not increase his dose unless he discusses this with Matthew Garner first. We will check labs and Matthew Garner will follow up as directed. We will check labs and Matthew Garner will follow up as directed.  Obesity Matthew Garner is currently in the action stage of change. As such, his goal is to continue with weight loss efforts He has agreed to follow a lower carbohydrate, vegetable and lean protein rich diet plan Matthew Garner has been instructed to work up to a goal of 150 minutes of combined cardio and strengthening exercise per week for weight loss and overall health benefits. We discussed the following Behavioral Modification Strategies today: increase H2O intake and work on meal planning and easy cooking plans  Matthew Garner has agreed to follow up with our clinic in 3 weeks. He was informed of the importance of frequent follow up visits to maximize his success with intensive lifestyle modifications for his multiple health conditions.  ALLERGIES: No Known Allergies  MEDICATIONS: Current Outpatient Medications on File Prior to Visit  Medication Sig Dispense Refill   atorvastatin (LIPITOR) 10 MG tablet Take 1 tablet (10 mg total) by mouth daily at 6 PM. 90 tablet 0   losartan (COZAAR) 50 MG tablet Take 1 tablet (50 mg total) by mouth every morning. 90 tablet 0   metFORMIN (GLUCOPHAGE) 500 MG tablet Take 1 tablet (500 mg total) by mouth daily with breakfast. 90 tablet 0   omeprazole (PRILOSEC) 40 MG capsule Take 1 capsule (40 mg total) by mouth daily. 30 capsule 0   sildenafil (VIAGRA) 100 MG tablet TAKE 1/2 TABLET BY MOUTH DAILY AS NEEDED FOR ERECTILE DYSFUNCTION. 5 tablet 3   Vitamin D, Ergocalciferol, (DRISDOL) 1.25 MG (50000 UT) CAPS capsule Take 1 capsule (50,000 Units total) by mouth every 7 (seven) days. 4 capsule 0     Current Facility-Administered Medications on File Prior to Visit  Medication Dose Route Frequency Provider Last Rate Last Dose   0.9 %  sodium chloride infusion  500 mL Intravenous Once Matthew Artist, MD        PAST MEDICAL HISTORY: Past Medical History:  Diagnosis Date   Allergy    environmental   Anemia    Back pain    Diabetes mellitus    Environmental allergies 11/27/2011   GERD (gastroesophageal reflux disease)    Hyperlipidemia    Hypertension    Joint pain    Knee pain, bilateral    Sebaceous cyst 11/10/2012   Snores     PAST SURGICAL HISTORY: Past Surgical History:  Procedure Laterality Date   APPENDECTOMY     KNEE SURGERY     right   MASS EXCISION Bilateral 11/24/2012   Procedure: EXCISION SCALP MASS X2;  Surgeon: [name], MD;  Location: [facility];  Service: General;  Laterality: Bilateral;   SPINE SURGERY  2004   lumb   UPPER GASTROINTESTINAL ENDOSCOPY      SOCIAL HISTORY: Social History   Tobacco Use   Smoking status: Never Smoker   Smokeless tobacco: Never Used  Substance Use Topics   Alcohol use: No   Drug use: No    FAMILY HISTORY: Family History  Problem Relation Age of Onset   Colon cancer Neg Hx    Colon polyps Neg Hx     ROS: Review of Systems  Constitutional: Positive for weight loss.  Cardiovascular: Negative for chest pain.  Gastrointestinal: Negative for diarrhea, nausea and vomiting.  Musculoskeletal:       Negative for muscle weakness  Endo/Heme/Allergies:       Negative for polyphagia Negative for hypoglycemia    PHYSICAL EXAM: Blood pressure 121/76, pulse 61, temperature 98.2 F (36.8 C), temperature source Oral, height 5\' 4"  (1.626 m), weight 241 lb (109.3 kg), SpO2 96 %. Body mass index is 41.37 kg/m. Physical Exam Vitals signs reviewed.  Constitutional:      Appearance: Normal appearance. He is well-developed. He is obese.  Cardiovascular:     Rate and  Rhythm: Normal rate.  Pulmonary:     Effort: Pulmonary effort is normal.  Musculoskeletal: Normal range of motion.  Skin:    General: Skin is warm and dry.  Neurological:     Mental Status: He is alert and oriented to person, place, and time.  Psychiatric:        Mood and Affect: Mood normal.        Behavior: Behavior normal.     RECENT LABS AND TESTS: BMET    Component Value Date/Time   NA 139 07/10/2018 1154   K 4.4 07/10/2018 1154   CL 99 07/10/2018 1154   CO2 21 07/10/2018 1154   GLUCOSE 109 (H) 07/10/2018 1154   GLUCOSE 179 (H) 09/02/2017 1634   BUN 18 07/10/2018 1154   CREATININE 0.76 07/10/2018 1154   CREATININE 0.76 09/15/2015 1525   CALCIUM 9.6 07/10/2018 1154   GFRNONAA 103 07/10/2018 1154   GFRNONAA >89 09/15/2015 1525   GFRAA 119 07/10/2018 1154   GFRAA >89 09/15/2015 1525   Lab Results  Component Value Date   HGBA1C 6.0 (H) 07/10/2018   HGBA1C 6.3 (H) 12/06/2017   HGBA1C 6.2 (H) 09/10/2017   HGBA1C 6.1 (H) 06/11/2017   HGBA1C 6.8 (H) 02/04/2017   Lab Results  Component Value Date   INSULIN 13.5 07/10/2018   INSULIN 5.7 09/10/2017   INSULIN 15.5 02/04/2017   CBC    Component Value Date/Time   WBC 10.8 (H) 09/02/2017 1634   RBC 5.25 09/02/2017 1634   HGB 15.8 09/02/2017 1634   HGB 16.0 02/04/2017 1035   HCT 45.4 09/02/2017 1634   HCT 46.1 02/04/2017 1035   PLT 323 09/02/2017 1634   MCV 86.5 09/02/2017 1634   MCV 88 02/04/2017 1035   MCH 30.1 09/02/2017 1634   MCHC 34.8 09/02/2017 1634   RDW 13.2 09/02/2017 1634   RDW 13.4 02/04/2017 1035   LYMPHSABS 1.5 02/04/2017 1035   MONOABS 0.7 08/20/2016 0926   EOSABS 0.1 02/04/2017 1035   BASOSABS 0.1 02/04/2017 1035   Iron/TIBC/Ferritin/ %Sat No results found for: IRON, TIBC, FERRITIN, IRONPCTSAT Lipid Panel     Component Value Date/Time   CHOL 167 02/17/2018 1318   TRIG 65 02/17/2018 1318   HDL 65 02/17/2018 1318   CHOLHDL 2.6 12/13/2016 1632   CHOLHDL 3.0 09/15/2015 1525   VLDL 13  09/15/2015 1525   LDLCALC 89 02/17/2018 1318   Hepatic Function Panel     Component Value Date/Time   PROT 7.0 07/10/2018 1154   ALBUMIN 4.5 07/10/2018 1154   AST 19 07/10/2018 1154   ALT 16 07/10/2018 1154   ALKPHOS 84 07/10/2018 1154   BILITOT 0.3 07/10/2018 1154  Component Value Date/Time   TSH 1.620 02/04/2017 1035     Ref. Range 07/10/2018 11:54  Vitamin D, 25-Hydroxy Latest Ref Range: 30.0 - 100.0 ng/mL 37.8    OBESITY BEHAVIORAL INTERVENTION VISIT  Today's visit was # 27  Starting weight: 247 lbs Starting date: 02/04/2017 Today's weight :  241 lbs Today's date: 10/29/2018 Total lbs lost to date: 6    10/29/2018  Height 5\' 4"  (1.626 m)  Weight 241 lb (109.3 kg)  BMI (Calculated) 41.35  BLOOD PRESSURE - SYSTOLIC [id number]  BLOOD PRESSURE - DIASTOLIC 76   Body Fat % [id number] %  Total Body Water (lbs) 114.4 lbs    ASK: We discussed the diagnosis of obesity with Wallace Keller today and Eduar agreed to give Matthew Garner permission to discuss obesity behavioral modification therapy today.  ASSESS: Dementrius has the diagnosis of obesity and his BMI today is 41.35 Ananth is in the action stage of change   ADVISE: Gerson was educated on the multiple health risks of obesity as well as the benefit of weight loss to improve his health. He was advised of the need for long term treatment and the importance of lifestyle modifications to improve his current health and to decrease his risk of future health problems.  AGREE: Multiple dietary modification options and treatment options were discussed and  Loyal agreed to follow the recommendations documented in the above note.  ARRANGE: Tiree was educated on the importance of frequent visits to treat obesity as outlined per CMS and USPSTF guidelines and agreed to schedule his next follow up appointment today.  Corey Skains, am acting as transcriptionist for Abby Potash, PA-C I, Abby Potash, PA-C have reviewed above note and agree with its  content

## 2018-11-19 ENCOUNTER — Other Ambulatory Visit: Payer: Self-pay

## 2018-11-19 ENCOUNTER — Ambulatory Visit (INDEPENDENT_AMBULATORY_CARE_PROVIDER_SITE_OTHER): Payer: BLUE CROSS/BLUE SHIELD | Admitting: Physician Assistant

## 2018-11-19 ENCOUNTER — Encounter (INDEPENDENT_AMBULATORY_CARE_PROVIDER_SITE_OTHER): Payer: Self-pay | Admitting: Physician Assistant

## 2018-11-19 VITALS — BP 131/80 | HR 63 | Temp 97.9°F | Ht 64.0 in | Wt 245.0 lb

## 2018-11-19 DIAGNOSIS — K219 Gastro-esophageal reflux disease without esophagitis: Secondary | ICD-10-CM

## 2018-11-19 DIAGNOSIS — E559 Vitamin D deficiency, unspecified: Secondary | ICD-10-CM | POA: Diagnosis not present

## 2018-11-19 DIAGNOSIS — G4730 Sleep apnea, unspecified: Secondary | ICD-10-CM | POA: Diagnosis not present

## 2018-11-19 DIAGNOSIS — Z9189 Other specified personal risk factors, not elsewhere classified: Secondary | ICD-10-CM | POA: Diagnosis not present

## 2018-11-19 DIAGNOSIS — Z6841 Body Mass Index (BMI) 40.0 and over, adult: Secondary | ICD-10-CM

## 2018-11-19 MED ORDER — OMEPRAZOLE 40 MG PO CPDR: 40.0000 mg | DELAYED_RELEASE_CAPSULE | Freq: Every day | ORAL | 0 refills | Status: AC

## 2018-11-24 NOTE — Progress Notes (Signed)
Office: [phone]  /  Fax: [phone]   HPI:   Chief Complaint: OBESITY Matthew Garner is here to discuss his progress with his obesity treatment plan. He is on the lower carbohydrate, vegetable and lean protein rich diet plan and is following his eating plan approximately 95 % of the time. He states he is exercising 0 minutes 0 times per week. [name] reports that he has been following the plan closely and he is enjoying it. [name] is not sleeping well and he reports excessive daytime sleepiness. His weight is 245 lb (111.1 kg) today and has had a weight gain of 4 pounds over a period of 3 weeks since his last visit. He has gained 8 lbs since starting treatment with [name].  Vitamin D deficiency [name] has a diagnosis of vitamin D deficiency. He cannot take gel caps, due to his religion, so he is taking OTC 5,000 IU, totaling 50,000 IU weekly. His last level was not at goal. [name] denies nausea, vomiting or muscle weakness.  Sleep disorder breathing Patient reports waking up gasping for breath at night and excessive daytime somnolence.  GERD (gastroesophageal reflux disease) [name] has a diagnosis of GERD and he is on Prilosec. He has no current symptoms. He has no blood in his stool and he has no black stools.  ASSESSMENT AND PLAN:  Vitamin D deficiency  Gastroesophageal reflux disease - Plan: omeprazole (PRILOSEC) 40 MG capsule  Sleep disorder breathing - Plan: Ambulatory referral to Neurology  At risk for sleep apnea  Class 3 severe obesity with serious comorbidity and body mass index (BMI) of 40.0 to 44.9 in adult, unspecified obesity type ([place])  PLAN:  Vitamin D Deficiency [name] was informed that low vitamin D levels contributes to fatigue and are associated with obesity, breast, and colon cancer. [name] agrees to increase OTC Vit D to 100,000 IU a week and he will follow up for routine testing of vitamin D, at least 2-3 times per year. He was informed of the risk of over-replacement of  vitamin D and agrees to not increase his dose unless he discusses this with [name] first. [name] agrees to follow up with our clinic in 3 weeks.  Sleep disorder breathing We will refer patient to neurology for a sleep study ([facility]). He agrees to follow up with our clinic in 3 weeks.  Obstructive Sleep Apnea Risk Counseling [name] was given extended  (15 minutes) coronary artery disease prevention counseling today. He is 58 y.o. male and has risk factors for obstructive sleep apnea including obesity. We discussed intensive lifestyle modifications today with an emphasis on specific weight loss instructions and strategies.  GERD (gastroesophageal reflux disease) [name] agrees to continue Prilosec 40 mg daily #30 with no refills and follow up as directed.  Obesity [name] is currently in the action stage of change. As such, his goal is to continue with weight loss efforts He has agreed to follow the lower carbohydrate, vegetable and lean protein rich diet plan [name] has been instructed to work up to a goal of 150 minutes of combined cardio and strengthening exercise per week for weight loss and overall health benefits. We discussed the following Behavioral Modification Strategies today: keeping healthy foods in the home and work on meal planning and easy cooking plans  [name] has agreed to follow up with our clinic in 3 weeks. He was informed of the importance of frequent follow up visits to maximize his success with intensive lifestyle modifications for his multiple health conditions.  ALLERGIES: No Known Allergies  MEDICATIONS: Current Outpatient Medications on File Prior to Visit  Medication Sig Dispense Refill   atorvastatin (LIPITOR) 10 MG tablet Take 1 tablet (10 mg total) by mouth daily at 6 PM. 90 tablet 0   losartan (COZAAR) 50 MG tablet Take 1 tablet (50 mg total) by mouth every morning. 90 tablet 0   metFORMIN (GLUCOPHAGE) 500 MG tablet Take 1 tablet (500 mg  total) by mouth daily with breakfast. 90 tablet 0   sildenafil (VIAGRA) 100 MG tablet TAKE 1/2 TABLET BY MOUTH DAILY AS NEEDED FOR ERECTILE DYSFUNCTION. 5 tablet 3   Vitamin D, Ergocalciferol, (DRISDOL) 1.25 MG (50000 UT) CAPS capsule Take 1 capsule (50,000 Units total) by mouth every 7 (seven) days. 4 capsule 0   Current Facility-Administered Medications on File Prior to Visit  Medication Dose Route Frequency Provider Last Rate Last Dose   0.9 %  sodium chloride infusion  500 mL Intravenous Once Ladene Artist, MD        PAST MEDICAL HISTORY: Past Medical History:  Diagnosis Date   Allergy    environmental   Anemia    Back pain    Diabetes mellitus    Environmental allergies 11/27/2011   GERD (gastroesophageal reflux disease)    Hyperlipidemia    Hypertension    Joint pain    Knee pain, bilateral    Sebaceous cyst 11/10/2012   Snores     PAST SURGICAL HISTORY: Past Surgical History:  Procedure Laterality Date   APPENDECTOMY     KNEE SURGERY     right   MASS EXCISION Bilateral 11/24/2012   Procedure: EXCISION SCALP MASS X2;  Surgeon: [name], MD;  Location: [facility];  Service: General;  Laterality: Bilateral;   SPINE SURGERY  2004   lumb   UPPER GASTROINTESTINAL ENDOSCOPY      SOCIAL HISTORY: Social History   Tobacco Use   Smoking status: Never Smoker   Smokeless tobacco: Never Used  Substance Use Topics   Alcohol use: No   Drug use: No    FAMILY HISTORY: Family History  Problem Relation Age of Onset   Colon cancer Neg Hx    Colon polyps Neg Hx     ROS: Review of Systems  Constitutional: Negative for weight loss.  Gastrointestinal: Negative for blood in stool, heartburn, melena, nausea and vomiting.  Musculoskeletal:       Negative for muscle weakness  Psychiatric/Behavioral: The patient has insomnia.     PHYSICAL EXAM: Blood pressure 131/80, pulse 63, temperature 97.9 F (36.6 C), temperature  source Oral, height 5\' 4"  (1.626 m), weight 245 lb (111.1 kg), SpO2 98 %. Body mass index is 42.05 kg/m. Physical Exam Vitals signs reviewed.  Constitutional:      Appearance: Normal appearance. He is well-developed. He is obese.  Cardiovascular:     Rate and Rhythm: Normal rate.  Pulmonary:     Effort: Pulmonary effort is normal.  Musculoskeletal: Normal range of motion.  Skin:    General: Skin is warm and dry.  Neurological:     Mental Status: He is alert and oriented to person, place, and time.  Psychiatric:        Mood and Affect: Mood normal.        Behavior: Behavior normal.     RECENT LABS AND TESTS: BMET    Component Value Date/Time   NA 139 10/29/2018 1158   K 4.7 10/29/2018 1158   CL 99 10/29/2018 1158  CO2 26 10/29/2018 1158   GLUCOSE 109 (H) 10/29/2018 1158   GLUCOSE 179 (H) 09/02/2017 1634   BUN 15 10/29/2018 1158   CREATININE 0.68 (L) 10/29/2018 1158   CREATININE 0.76 09/15/2015 1525   CALCIUM 9.5 10/29/2018 1158   GFRNONAA 108 10/29/2018 1158   GFRNONAA >89 09/15/2015 1525   GFRAA 124 10/29/2018 1158   GFRAA >89 09/15/2015 1525   Lab Results  Component Value Date   HGBA1C 6.4 (H) 10/29/2018   HGBA1C 6.0 (H) 07/10/2018   HGBA1C 6.3 (H) 12/06/2017   HGBA1C 6.2 (H) 09/10/2017   HGBA1C 6.1 (H) 06/11/2017   Lab Results  Component Value Date   INSULIN 11.1 10/29/2018   INSULIN 13.5 07/10/2018   INSULIN 5.7 09/10/2017   INSULIN 15.5 02/04/2017   CBC    Component Value Date/Time   WBC 10.8 (H) 09/02/2017 1634   RBC 5.25 09/02/2017 1634   HGB 15.8 09/02/2017 1634   HGB 16.0 02/04/2017 1035   HCT 45.4 09/02/2017 1634   HCT 46.1 02/04/2017 1035   PLT 323 09/02/2017 1634   MCV 86.5 09/02/2017 1634   MCV 88 02/04/2017 1035   MCH 30.1 09/02/2017 1634   MCHC 34.8 09/02/2017 1634   RDW 13.2 09/02/2017 1634   RDW 13.4 02/04/2017 1035   LYMPHSABS 1.5 02/04/2017 1035   MONOABS 0.7 08/20/2016 0926   EOSABS 0.1 02/04/2017 1035   BASOSABS 0.1  02/04/2017 1035   Iron/TIBC/Ferritin/ %Sat No results found for: IRON, TIBC, FERRITIN, IRONPCTSAT Lipid Panel     Component Value Date/Time   CHOL 175 10/29/2018 1158   TRIG 67 10/29/2018 1158   HDL 61 10/29/2018 1158   CHOLHDL 2.6 12/13/2016 1632   CHOLHDL 3.0 09/15/2015 1525   VLDL 13 09/15/2015 1525   LDLCALC 101 (H) 10/29/2018 1158   Hepatic Function Panel     Component Value Date/Time   PROT 7.2 10/29/2018 1158   ALBUMIN 4.3 10/29/2018 1158   AST 16 10/29/2018 1158   ALT 18 10/29/2018 1158   ALKPHOS 92 10/29/2018 1158   BILITOT 0.4 10/29/2018 1158      Component Value Date/Time   TSH 1.620 02/04/2017 1035     Ref. Range 10/29/2018 11:58  Vitamin D, 25-Hydroxy Latest Ref Range: 30.0 - 100.0 ng/mL 32.7    OBESITY BEHAVIORAL INTERVENTION VISIT  Today's visit was # 28  Starting weight: 237 lbs Starting date: 02/04/2017 Today's weight : 245 lbs Today's date: 11/19/2018 Total lbs lost to date: 0    11/19/2018  Height 5\' 4"  (1.626 m)  Weight 245 lb (111.1 kg)  BMI (Calculated) 42.03  BLOOD PRESSURE - SYSTOLIC [id number]  BLOOD PRESSURE - DIASTOLIC 80   Body Fat % 99991111 %    ASK: We discussed the diagnosis of obesity with Wallace Keller today and [name] agreed to give [name] permission to discuss obesity behavioral modification therapy today.  ASSESS: Oluwadamilola has the diagnosis of obesity and his BMI today is 42.03 Eland is in the action stage of change   ADVISE: [place] was educated on the multiple health risks of obesity as well as the benefit of weight loss to improve his health. He was advised of the need for long term treatment and the importance of lifestyle modifications to improve his current health and to decrease his risk of future health problems.  AGREE: Multiple dietary modification options and treatment options were discussed and  Branddon agreed to follow the recommendations documented in the above note.  ARRANGE: Zyiere was educated  on the importance of frequent  visits to treat obesity as outlined per CMS and USPSTF guidelines and agreed to schedule his next follow up appointment today.  Corey Skains, am acting as transcriptionist for Abby Potash, PA-C I, Abby Potash, PA-C have reviewed above note and agree with its content

## 2018-12-11 ENCOUNTER — Ambulatory Visit (INDEPENDENT_AMBULATORY_CARE_PROVIDER_SITE_OTHER): Payer: BLUE CROSS/BLUE SHIELD | Admitting: Physician Assistant

## 2018-12-20 ENCOUNTER — Encounter (HOSPITAL_COMMUNITY): Payer: Self-pay

## 2018-12-20 ENCOUNTER — Other Ambulatory Visit: Payer: Self-pay

## 2018-12-20 ENCOUNTER — Emergency Department (HOSPITAL_COMMUNITY)
Admission: EM | Admit: 2018-12-20 | Discharge: 2018-12-20 | Disposition: A | Discharge status: Home / Self Care | Payer: BLUE CROSS/BLUE SHIELD | Attending: Emergency Medicine | Admitting: Emergency Medicine

## 2018-12-20 ENCOUNTER — Emergency Department (HOSPITAL_COMMUNITY): Payer: BLUE CROSS/BLUE SHIELD

## 2018-12-20 DIAGNOSIS — Z7984 Long term (current) use of oral hypoglycemic drugs: Secondary | ICD-10-CM | POA: Insufficient documentation

## 2018-12-20 DIAGNOSIS — Z79899 Other long term (current) drug therapy: Secondary | ICD-10-CM | POA: Diagnosis not present

## 2018-12-20 DIAGNOSIS — I10 Essential (primary) hypertension: Secondary | ICD-10-CM | POA: Insufficient documentation

## 2018-12-20 DIAGNOSIS — M545 Low back pain, unspecified: Secondary | ICD-10-CM

## 2018-12-20 DIAGNOSIS — E119 Type 2 diabetes mellitus without complications: Secondary | ICD-10-CM | POA: Diagnosis not present

## 2018-12-20 MED ORDER — HYDROCODONE-ACETAMINOPHEN 5-325 MG PO TABS
1.0000 | ORAL_TABLET | Freq: Once | ORAL | Status: AC
  Administered 2018-12-20: 1 via ORAL
  Filled 2018-12-20: qty 1

## 2018-12-20 MED ORDER — KETOROLAC TROMETHAMINE 60 MG/2ML IM SOLN
60.0000 mg | Freq: Once | INTRAMUSCULAR | Status: AC
  Administered 2018-12-20: 19:00:00 60 mg via INTRAMUSCULAR
  Filled 2018-12-20: qty 2

## 2018-12-20 MED ORDER — CYCLOBENZAPRINE HCL 10 MG PO TABS: 10.0000 mg | ORAL_TABLET | Freq: Two times a day (BID) | ORAL | 0 refills | Status: AC | PRN

## 2018-12-20 MED ORDER — CYCLOBENZAPRINE HCL 10 MG PO TABS
5.0000 mg | ORAL_TABLET | Freq: Once | ORAL | Status: AC
  Administered 2018-12-20: 5 mg via ORAL
  Filled 2018-12-20: qty 1

## 2018-12-20 MED ORDER — NAPROXEN 500 MG PO TABS: 500.0000 mg | ORAL_TABLET | Freq: Two times a day (BID) | ORAL | 0 refills | Status: DC

## 2018-12-20 NOTE — ED Triage Notes (Signed)
Pt COVID+ as of 12/4. Pt c/o left lower back pain starting yesterday. Pt denies fall or trauma. Pt denies cough, SOB, fever.

## 2018-12-20 NOTE — ED Notes (Signed)
An After Visit Summary was printed and given to the patient. Discharge instructions given and no further questions at this time. Pt states his wife is picking him up and taking him home.

## 2018-12-20 NOTE — ED Provider Notes (Signed)
[facility] DEPT Provider Note   CSN: [id number] Arrival date & time: 12/20/18  1808     History Chief Complaint  Patient presents with  . COVID+  . Back Pain    Matthew Garner is a 58 y.o. male.  Patient is a 58 year old gentleman with past medical history of back surgery presenting to the emergency department for low back pain.  Patient reports that the pain began yesterday about 1 hour after waking up.  Denies any injury or trauma.  The pain is the left lower part of the back and does not radiate.  Pain is worse with movement especially with trying to go from sitting to standing.  Denies any radiation of the pain, saddle anesthesia, numbness, tingling, weakness, fever, dysuria, IV drug use.  Patient tested positive for Covid on 1219.  Since then he is feeling much better and has not had a fever in greater than 72 hours.        Past Medical History:  Diagnosis Date  . Allergy    environmental  . Anemia   . Back pain   . Diabetes mellitus   . Environmental allergies 11/27/2011  . GERD (gastroesophageal reflux disease)   . Hyperlipidemia   . Hypertension   . Joint pain   . Knee pain, bilateral   . Sebaceous cyst 11/10/2012  . Snores     Patient Active Problem List   Diagnosis Date Noted  . Type 2 diabetes mellitus without complication, without long-term current use of insulin ([place]) 03/04/2017  . Vitamin D deficiency 03/04/2017  . Other fatigue 02/04/2017  . Shortness of breath on exertion 02/04/2017  . Knee pain, bilateral 03/29/2015  . Nonspecific abnormal electrocardiogram (ECG) (EKG) 04/21/2014  . Spinal stenosis of lumbar region 10/27/2013  . Pain of left calf 06/16/2013  . Diabetes ([place]) 11/27/2011  . Hyperlipidemia 11/27/2011  . Hypertension 11/27/2011    Past Surgical History:  Procedure Laterality Date  . APPENDECTOMY    . KNEE SURGERY     right  . MASS EXCISION Bilateral 11/24/2012   Procedure: EXCISION SCALP MASS X2;   Surgeon: [name], MD;  Location: [facility];  Service: General;  Laterality: Bilateral;  . SPINE SURGERY  2004   lumb  . UPPER GASTROINTESTINAL ENDOSCOPY         Family History  Problem Relation Age of Onset  . Colon cancer Neg Hx   . Colon polyps Neg Hx     Social History   Tobacco Use  . Smoking status: Never Smoker  . Smokeless tobacco: Never Used  Substance Use Topics  . Alcohol use: No  . Drug use: No    Home Medications Prior to Admission medications   Medication Sig Start Date End Date Taking? Authorizing Provider  atorvastatin (LIPITOR) 10 MG tablet Take 1 tablet (10 mg total) by mouth daily at 6 PM. 09/11/18   Abby Potash, PA-C  cyclobenzaprine (FLEXERIL) 10 MG tablet Take 1 tablet (10 mg total) by mouth 2 (two) times daily as needed for up to 7 days for muscle spasms. 12/20/18 12/27/18  Alveria Apley, PA-C  losartan (COZAAR) 50 MG tablet Take 1 tablet (50 mg total) by mouth every morning. 09/11/18   Abby Potash, PA-C  metFORMIN (GLUCOPHAGE) 500 MG tablet Take 1 tablet (500 mg total) by mouth daily with breakfast. 09/11/18   Abby Potash, PA-C  naproxen (NAPROSYN) 500 MG tablet Take 1 tablet (500 mg total) by mouth 2 (two)  times daily. 12/20/18   Alveria Apley, PA-C  omeprazole (PRILOSEC) 40 MG capsule Take 1 capsule (40 mg total) by mouth daily. 11/19/18   Abby Potash, PA-C  sildenafil (VIAGRA) 100 MG tablet TAKE 1/2 TABLET BY MOUTH DAILY AS NEEDED FOR ERECTILE DYSFUNCTION. 12/06/17   Wendie Agreste, MD  Vitamin D, Ergocalciferol, (DRISDOL) 1.25 MG (50000 UT) CAPS capsule Take 1 capsule (50,000 Units total) by mouth every 7 (seven) days. 09/11/18   Abby Potash, PA-C    Allergies    Patient has no known allergies.  Review of Systems   Review of Systems  Constitutional: Negative for chills and fever.  HENT: Negative for congestion and sore throat.   Respiratory: Negative for cough and shortness of breath.     Cardiovascular: Negative for chest pain.  Gastrointestinal: Negative for abdominal pain and nausea.  Genitourinary: Negative for dysuria, flank pain and hematuria.  Musculoskeletal: Positive for back pain. Negative for arthralgias, gait problem, joint swelling, myalgias, neck pain and neck stiffness.  Skin: Negative for rash and wound.  Neurological: Negative for numbness.    Physical Exam Updated Vital Signs BP 127/80   Pulse 88   Temp 98.3 F (36.8 C) (Oral)   Resp 18   SpO2 99%   Physical Exam Vitals and nursing note reviewed.  Constitutional:      General: He is not in acute distress.    Appearance: Normal appearance. He is not ill-appearing, toxic-appearing or diaphoretic.  HENT:     Head: Normocephalic.  Eyes:     Conjunctiva/sclera: Conjunctivae normal.  Cardiovascular:     Rate and Rhythm: Normal rate and regular rhythm.  Pulmonary:     Effort: Pulmonary effort is normal.  Musculoskeletal:       Legs:     Comments: Tender to palpation in the left iliac crest and left paraspinal muscles.  No point bony tenderness.  Decreased range of motion of the lumbar spine secondary to pain.  Skin:    General: Skin is dry.  Neurological:     Mental Status: He is alert.     Cranial Nerves: No cranial nerve deficit.     Sensory: No sensory deficit.     Motor: No weakness.     Gait: Gait normal.     Deep Tendon Reflexes: Reflexes normal.  Psychiatric:        Mood and Affect: Mood normal.     ED Results / Procedures / Treatments   Labs (all labs ordered are listed, but only abnormal results are displayed) Labs Reviewed - No data to display  EKG None  Radiology DG Lumbar Spine Complete  Result Date: 12/20/2018 CLINICAL DATA:  Back pain.  COVID-19 positive patient.  No fall. EXAM: LUMBAR SPINE - COMPLETE 4+ VIEW COMPARISON:  None. FINDINGS: A cluster of calcifications to the left of L2 or present in February of 2011. A CT scan from September 2019 demonstrated these  were the mesentery. Soft tissues are otherwise normal. Multilevel degenerative disc disease. Lower lumbar facet degenerative changes. Straightening of normal lordosis. No other malalignment. No fracture. No other acute abnormalities. Probable degenerative changes in the left SI joint. IMPRESSION: 1. Degenerative disc disease and lower lumbar facet degenerative changes. Probable degenerative changes in the left SI joint, incompletely evaluated. 2. No other acute abnormalities. Electronically Signed   By: Dorise Bullion III M.D   On: 12/20/2018 19:12    Procedures Procedures (including critical care time)  Medications Ordered in ED Medications  ketorolac (TORADOL)  injection 60 mg (60 mg Intramuscular Given 12/20/18 1905)  cyclobenzaprine (FLEXERIL) tablet 5 mg (5 mg Oral Given 12/20/18 1905)  HYDROcodone-acetaminophen (NORCO/VICODIN) 5-325 MG per tablet 1 tablet (1 tablet Oral Given 12/20/18 1905)    ED Course  I have reviewed the triage vital signs and the nursing notes.  Pertinent labs & imaging results that were available during my care of the patient were reviewed by me and considered in my medical decision making (see chart for details).  Clinical Course as of Dec 20 1947  Sat Dec 20, 2018  1946 Patient with history of back surgery presenting with acute back pain since this morning.  X-ray showing arthritis but no acute injuries or findings.  Tender to palpation in the muscles.  Pain improved with hydrocodone, cyclobenzaprine and Toradol.  Advised to follow-up with his spine surgeon and will send home with supportive treatment at this time.   [KM]  1944 Patient was evaluated for back pain today. Patient has no concerning symptoms or physical exam findings including no fever, no loss of control of bowel or bladder, no urinary retention, no saddle anesthesia, no leg weakness and no pain radiation into the legs. She was given medication to treat her symptoms and advised to f/u with PMD for  further workup including possible PT, medication change, further imaging, etc. She was advised on all concerning symptoms above and to return to the ED if any of them arise.       [KM]    Clinical Course User Index [KM] Kristine Royal   MDM Rules/Calculators/A&P                      Based on review of vitals, medical screening exam, lab work and/or imaging, there does not appear to be an acute, emergent etiology for the patient's symptoms. Counseled pt on good return precautions and encouraged both PCP and ED follow-up as needed.  Prior to discharge, I also discussed incidental imaging findings with patient in detail and advised appropriate, recommended follow-up in detail.  Clinical Impression: 1. Acute left-sided low back pain without sciatica     Disposition: Discharge  Prior to providing a prescription for a controlled substance, I independently reviewed the patient's recent prescription history on the [facility]. The patient had no recent or regular prescriptions and was deemed appropriate for a brief, less than 3 day prescription of narcotic for acute analgesia.  This note was prepared with assistance of Systems analyst. Occasional wrong-word or sound-a-like substitutions may have occurred due to the inherent limitations of voice recognition software.  Final Clinical Impression(s) / ED Diagnoses Final diagnoses:  Acute left-sided low back pain without sciatica    Rx / DC Orders ED Discharge Orders         Ordered    cyclobenzaprine (FLEXERIL) 10 MG tablet  2 times daily PRN     12/20/18 1948    naproxen (NAPROSYN) 500 MG tablet  2 times daily     12/20/18 1948           Kristine Royal 12/20/18 1949    Blanchie Dessert, MD 12/20/18 [phone]

## 2018-12-20 NOTE — Discharge Instructions (Addendum)
You were seen today for lower back pain.  I think you are having muscle spasms.  Your x-ray was reassuring.  If you have any new or worsening symptoms please return to the emergency department.  I have sent some medication to the pharmacy for you.  Please be advised that this medication can cause sleepiness.

## 2018-12-22 ENCOUNTER — Other Ambulatory Visit: Payer: Self-pay

## 2018-12-22 ENCOUNTER — Encounter (INDEPENDENT_AMBULATORY_CARE_PROVIDER_SITE_OTHER): Payer: Self-pay | Admitting: Physician Assistant

## 2018-12-22 ENCOUNTER — Ambulatory Visit (INDEPENDENT_AMBULATORY_CARE_PROVIDER_SITE_OTHER): Payer: BLUE CROSS/BLUE SHIELD | Admitting: Physician Assistant

## 2018-12-22 VITALS — BP 111/72 | HR 60 | Temp 98.0°F | Ht 64.0 in | Wt 241.0 lb

## 2018-12-22 DIAGNOSIS — E559 Vitamin D deficiency, unspecified: Secondary | ICD-10-CM

## 2018-12-22 DIAGNOSIS — Z9189 Other specified personal risk factors, not elsewhere classified: Secondary | ICD-10-CM

## 2018-12-22 DIAGNOSIS — I10 Essential (primary) hypertension: Secondary | ICD-10-CM | POA: Diagnosis not present

## 2018-12-22 DIAGNOSIS — Z6841 Body Mass Index (BMI) 40.0 and over, adult: Secondary | ICD-10-CM

## 2018-12-22 DIAGNOSIS — E7849 Other hyperlipidemia: Secondary | ICD-10-CM

## 2018-12-22 DIAGNOSIS — E119 Type 2 diabetes mellitus without complications: Secondary | ICD-10-CM | POA: Diagnosis not present

## 2018-12-22 MED ORDER — VITAMIN D (ERGOCALCIFEROL) 1.25 MG (50000 UNIT) PO CAPS: 50000.0000 [IU] | ORAL_CAPSULE | ORAL | 0 refills | Status: DC

## 2018-12-22 MED ORDER — ATORVASTATIN CALCIUM 10 MG PO TABS: 10.0000 mg | ORAL_TABLET | Freq: Every day | ORAL | 0 refills | Status: AC

## 2018-12-22 MED ORDER — LOSARTAN POTASSIUM 50 MG PO TABS: 50.0000 mg | ORAL_TABLET | Freq: Every morning | ORAL | 0 refills | Status: AC

## 2018-12-22 MED ORDER — METFORMIN HCL 500 MG PO TABS: 500.0000 mg | ORAL_TABLET | Freq: Every day | ORAL | 0 refills | Status: AC

## 2018-12-23 ENCOUNTER — Ambulatory Visit (INDEPENDENT_AMBULATORY_CARE_PROVIDER_SITE_OTHER): Payer: BLUE CROSS/BLUE SHIELD | Admitting: Physician Assistant

## 2018-12-23 NOTE — Progress Notes (Signed)
Office: [phone]  /  Fax: [phone]   HPI:  Chief Complaint: OBESITY Neale is here to discuss his progress with his obesity treatment plan. He is following a lower carbohydrate, vegetable and lean protein rich diet plan and states he is following his eating plan approximately 20-30% of the time. He states he is exercising 0 minutes 0 times per week.  Jashaun reports that he had COVID 3 weeks ago and was not able to eat much food due to decreased appetite at that time. He is back to eating on his low carb plan.  Today's visit was #29 Starting weight: 247 lbs Starting date: 02/04/2017 Today's weight: 241 lbs  Today's date: 12/22/2018 Total lbs lost to date: 6 Total lbs lost since last in-office visit: 4   Hyperlipidemia Sabastain has a diagnosis of hyperlipidemia and is on Lipitor. No chest pain. He is not exercising.  Hypertension [name] has a diagnosis of hypertension and is on losartan. No chest pain. Blood pressure is normal. No headache.   At risk for cardiovascular disease Skylar is at a higher than average risk for cardiovascular disease due to obesity. He currently denies any chest pain.  Vitamin D deficiency Xane has a diagnosis of Vitamin D deficiency and is not on calcium, but is taking prescription Vitamin D. No nausea, vomiting, or muscle weakness on Vitamin D.  Diabetes Mellitus Type II Flores has a diagnosis of diabetes mellitus type II and is on metformin. Last A1c 6.4 on 10/29/2018. He states fasting blood sugars average in the 120's. No nausea, vomiting, or diarrhea. No polyphagia.  ASSESSMENT AND PLAN:  Other hyperlipidemia - Plan: atorvastatin (LIPITOR) 10 MG tablet  Essential hypertension - Plan: losartan (COZAAR) 50 MG tablet  Vitamin D deficiency - Plan: Vitamin D, Ergocalciferol, (DRISDOL) 1.25 MG (50000 UT) CAPS capsule  Type 2 diabetes mellitus without complication, without long-term current use of insulin (HCC) - Plan: metFORMIN (GLUCOPHAGE) 500 MG  tablet  At risk for heart disease  Class 3 severe obesity with serious comorbidity and body mass index (BMI) of 40.0 to 44.9 in adult, unspecified obesity type ([place])  PLAN:  Hyperlipidemia Intensive lifestyle modifications as the first line treatment for hyperlipidemia. We discussed many lifestyle modifications today and Aimar will continue to work on diet, exercise and weight loss efforts. Asiel was given a refill on his Lipitor #90 with 0 refills and agrees to follow-up with our clinic in 3 weeks.  Hypertension Pake is working on healthy weight loss and exercise to improve blood pressure control. He was given a refill on his losartan #90 with 0 refills and agrees to follow-up with our clinic in 3 weeks. We will watch for signs of hypotension as he continues his lifestyle modifications.  Cardiovascular risk counseling Zaine was given (~15 minutes) coronary artery disease prevention counseling today. He is 58 y.o. male and has risk factors for heart disease including obesity. We discussed intensive lifestyle modifications today with an emphasis on specific weight loss instructions and strategies.   Vitamin D Deficiency Waddell was informed that low Vitamin D levels contributes to fatigue and are associated with obesity, breast, and colon cancer. He agrees to continue to take prescription Vit D @ 50,000 IU every week #4 with 0 refills and will follow-up for routine testing of Vitamin D, at least 2-3 times per year. He was informed of the risk of over-replacement of Vitamin D and agrees to not increase his dose unless he discusses this with [name] first. Infant agrees to  follow-up with our clinic in 3 weeks.  Diabetes II [name] has been given diabetes education by myself today. Good blood sugar control is important to decrease the likelihood of diabetic complications such as nephropathy, neuropathy, limb loss, blindness, coronary artery disease, and death. Intensive lifestyle modification including diet,  exercise and weight loss were discussed as the first line treatment for diabetes. Khalon was given a refill on his metformin #90 with 0 refills. He agrees to follow-up with our clinic in 3 weeks.  Obesity Rc is currently in the action stage of change. As such, his goal is to continue with weight loss efforts. He has agreed to follow a lower carbohydrate, vegetable and lean protein rich diet plan. Nasif has been instructed to work up to a goal of 150 minutes of combined cardio and strengthening exercise per week for weight loss and overall health benefits. We discussed the following Behavioral Modification Strategies today: work on meal planning and easy cooking plans, and keeping healthy foods in the home.  Davit has agreed to follow-up with our clinic in 3 weeks. He was informed of the importance of frequent follow-up visits to maximize his success with intensive lifestyle modifications for his multiple health conditions.  ALLERGIES: No Known Allergies  MEDICATIONS: Current Outpatient Medications on File Prior to Visit  Medication Sig Dispense Refill  . cyclobenzaprine (FLEXERIL) 10 MG tablet Take 1 tablet (10 mg total) by mouth 2 (two) times daily as needed for up to 7 days for muscle spasms. 14 tablet 0  . naproxen (NAPROSYN) 500 MG tablet Take 1 tablet (500 mg total) by mouth 2 (two) times daily. 30 tablet 0  . omeprazole (PRILOSEC) 40 MG capsule Take 1 capsule (40 mg total) by mouth daily. 30 capsule 0  . sildenafil (VIAGRA) 100 MG tablet TAKE 1/2 TABLET BY MOUTH DAILY AS NEEDED FOR ERECTILE DYSFUNCTION. 5 tablet 3   Current Facility-Administered Medications on File Prior to Visit  Medication Dose Route Frequency Provider Last Rate Last Admin  . 0.9 %  sodium chloride infusion  500 mL Intravenous Once Ladene Artist, MD        PAST MEDICAL HISTORY: Past Medical History:  Diagnosis Date  . Allergy    environmental  . Anemia   . Back pain   . Diabetes mellitus   .  Environmental allergies 11/27/2011  . GERD (gastroesophageal reflux disease)   . Hyperlipidemia   . Hypertension   . Joint pain   . Knee pain, bilateral   . Sebaceous cyst 11/10/2012  . Snores     PAST SURGICAL HISTORY: Past Surgical History:  Procedure Laterality Date  . APPENDECTOMY    . KNEE SURGERY     right  . MASS EXCISION Bilateral 11/24/2012   Procedure: EXCISION SCALP MASS X2;  Surgeon: [name], MD;  Location: [facility];  Service: General;  Laterality: Bilateral;  . SPINE SURGERY  2004   lumb  . UPPER GASTROINTESTINAL ENDOSCOPY      SOCIAL HISTORY: Social History   Tobacco Use  . Smoking status: Never Smoker  . Smokeless tobacco: Never Used  Substance Use Topics  . Alcohol use: No  . Drug use: No    FAMILY HISTORY: Family History  Problem Relation Age of Onset  . Colon cancer Neg Hx   . Colon polyps Neg Hx    ROS: Review of Systems  Constitutional: Positive for weight loss.  Cardiovascular: Negative for chest pain.  Gastrointestinal: Negative for diarrhea, nausea and  vomiting.  Musculoskeletal:       Negative for muscle weakness.  Neurological: Negative for headaches.  Endo/Heme/Allergies:       Negative for polyphagia.   PHYSICAL EXAM: Blood pressure 111/72, pulse 60, temperature 98 F (36.7 C), temperature source Oral, height 5\' 4"  (1.626 m), weight 241 lb (109.3 kg), SpO2 97 %. Body mass index is 41.37 kg/m. Physical Exam Vitals reviewed.  Constitutional:      Appearance: Normal appearance. He is obese.  Cardiovascular:     Rate and Rhythm: Normal rate.     Pulses: Normal pulses.  Pulmonary:     Effort: Pulmonary effort is normal.     Breath sounds: Normal breath sounds.  Musculoskeletal:        General: Normal range of motion.  Skin:    General: Skin is warm and dry.  Neurological:     Mental Status: He is alert and oriented to person, place, and time.  Psychiatric:        Behavior: Behavior normal.    RECENT LABS AND TESTS: BMET    Component Value Date/Time   NA 139 10/29/2018 1158   K 4.7 10/29/2018 1158   CL 99 10/29/2018 1158   CO2 26 10/29/2018 1158   GLUCOSE 109 (H) 10/29/2018 1158   GLUCOSE 179 (H) 09/02/2017 1634   BUN 15 10/29/2018 1158   CREATININE 0.68 (L) 10/29/2018 1158   CREATININE 0.76 09/15/2015 1525   CALCIUM 9.5 10/29/2018 1158   GFRNONAA 108 10/29/2018 1158   GFRNONAA >89 09/15/2015 1525   GFRAA 124 10/29/2018 1158   GFRAA >89 09/15/2015 1525   Lab Results  Component Value Date   HGBA1C 6.4 (H) 10/29/2018   HGBA1C 6.0 (H) 07/10/2018   HGBA1C 6.3 (H) 12/06/2017   HGBA1C 6.2 (H) 09/10/2017   HGBA1C 6.1 (H) 06/11/2017   Lab Results  Component Value Date   INSULIN 11.1 10/29/2018   INSULIN 13.5 07/10/2018   INSULIN 5.7 09/10/2017   INSULIN 15.5 02/04/2017   CBC    Component Value Date/Time   WBC 10.8 (H) 09/02/2017 1634   RBC 5.25 09/02/2017 1634   HGB 15.8 09/02/2017 1634   HGB 16.0 02/04/2017 1035   HCT 45.4 09/02/2017 1634   HCT 46.1 02/04/2017 1035   PLT 323 09/02/2017 1634   MCV 86.5 09/02/2017 1634   MCV 88 02/04/2017 1035   MCH 30.1 09/02/2017 1634   MCHC 34.8 09/02/2017 1634   RDW 13.2 09/02/2017 1634   RDW 13.4 02/04/2017 1035   LYMPHSABS 1.5 02/04/2017 1035   MONOABS 0.7 08/20/2016 0926   EOSABS 0.1 02/04/2017 1035   BASOSABS 0.1 02/04/2017 1035   Iron/TIBC/Ferritin/ %Sat No results found for: IRON, TIBC, FERRITIN, IRONPCTSAT Lipid Panel     Component Value Date/Time   CHOL 175 10/29/2018 1158   TRIG 67 10/29/2018 1158   HDL 61 10/29/2018 1158   CHOLHDL 2.6 12/13/2016 1632   CHOLHDL 3.0 09/15/2015 1525   VLDL 13 09/15/2015 1525   LDLCALC 101 (H) 10/29/2018 1158   Hepatic Function Panel     Component Value Date/Time   PROT 7.2 10/29/2018 1158   ALBUMIN 4.3 10/29/2018 1158   AST 16 10/29/2018 1158   ALT 18 10/29/2018 1158   ALKPHOS 92 10/29/2018 1158   BILITOT 0.4 10/29/2018 1158      Component Value Date/Time    TSH 1.620 02/04/2017 1035    OBESITY BEHAVIORAL INTERVENTION VISIT DOCUMENTATION FOR INSURANCE (~15 minutes)  I, Michaelene Song, am acting as  transcriptionist for Abby Potash, PA-C I, Abby Potash, PA-C have reviewed above note and agree with its content

## 2019-01-13 ENCOUNTER — Ambulatory Visit (INDEPENDENT_AMBULATORY_CARE_PROVIDER_SITE_OTHER): Payer: BLUE CROSS/BLUE SHIELD | Admitting: Physician Assistant

## 2019-01-13 ENCOUNTER — Encounter (INDEPENDENT_AMBULATORY_CARE_PROVIDER_SITE_OTHER): Payer: Self-pay | Admitting: Physician Assistant

## 2019-01-13 ENCOUNTER — Other Ambulatory Visit: Payer: Self-pay

## 2019-01-13 VITALS — BP 127/74 | HR 65 | Temp 98.4°F | Ht 64.0 in | Wt 240.0 lb

## 2019-01-13 DIAGNOSIS — Z9189 Other specified personal risk factors, not elsewhere classified: Secondary | ICD-10-CM | POA: Diagnosis not present

## 2019-01-13 DIAGNOSIS — N529 Male erectile dysfunction, unspecified: Secondary | ICD-10-CM

## 2019-01-13 DIAGNOSIS — E119 Type 2 diabetes mellitus without complications: Secondary | ICD-10-CM | POA: Diagnosis not present

## 2019-01-13 DIAGNOSIS — Z6841 Body Mass Index (BMI) 40.0 and over, adult: Secondary | ICD-10-CM

## 2019-01-13 MED ORDER — SILDENAFIL CITRATE 100 MG PO TABS: ORAL_TABLET | ORAL | 0 refills | Status: AC

## 2019-01-13 MED ORDER — GLUCOSE BLOOD VI STRP: 1.0000 | ORAL_STRIP | 0 refills | Status: AC | PRN

## 2019-01-15 NOTE — Progress Notes (Signed)
Chief Complaint:   OBESITY Matthew Garner is here to discuss his progress with his obesity treatment plan along with follow-up of his obesity related diagnoses. Sutter is following a lower carbohydrate, vegetable and lean protein rich diet plan and states he is following his eating plan approximately 95% of the time. [name] states he is exercising 0 minutes 0 times per week.  Today's visit was #: 66 Starting weight: 247 lbs Starting date: 02/04/2017 Today's weight: 240 lbs Today's date: 01/13/2019 Total lbs lost to date: 7 Total lbs lost since last in-office visit: 1  Interim History: [name] is enjoying the low carb plan and he finds it easy to follow. He is eating a lot of chicken salad.  Subjective:   Type 2 diabetes mellitus without complication, without long-term current use of insulin (HCC) (chronic) [name] is on metformin. He has no nausea, vomiting or diarrhea. His fasting blood sugars range between 120 and 130. Her last A1c was 6.4 (10/29/18).  Erectile dysfunction, unspecified erectile dysfunction type [name] is on Viagra. His blood pressure is normal.  At risk for heart disease [name] is at a higher than average risk for cardiovascular disease due to obesity and diabetes. Reviewed: no chest pain on exertion, no dyspnea on exertion, and no swelling of ankles.  Assessment/Plan:   Type 2 diabetes mellitus without complication, without long-term current use of insulin (HCC) (chronic) [name] has been given diabetes education by myself today. Good blood sugar control is important to decrease the likelihood of diabetic complications such as nephropathy, neuropathy, limb loss, blindness, coronary artery disease, and death. Intensive lifestyle modification including diet, exercise and weight loss were discussed as the first line treatment for diabetes. Prescription was written today for Livongo test strips #100 with no refills.  Erectile dysfunction, unspecified erectile dysfunction  type [name] agrees to continue Viagra 100 mg 1/2 tablet as needed #5 with no refills.  At risk for heart disease [name] was given approximately 15 minutes of coronary artery disease prevention counseling today. He is 59 y.o. male and has risk factors for heart disease including obesity. We discussed intensive lifestyle modifications today with an emphasis on specific weight loss instructions and strategies.   Obesity [name] is currently in the action stage of change. As such, his goal is to continue with weight loss efforts. He has agreed to following a lower carbohydrate, vegetable and lean protein rich diet plan.   We discussed the following exercise goals today: For substantial health benefits, adults should do at least 150 minutes (2 hours and 30 minutes) a week of moderate-intensity, or 75 minutes (1 hour and 15 minutes) a week of vigorous-intensity aerobic physical activity, or an equivalent combination of moderate- and vigorous-intensity aerobic activity. Aerobic activity should be performed in episodes of at least 10 minutes, and preferably, it should be spread throughout the week. Adults should also include muscle-strengthening activities that involve all major muscle groups on 2 or more days a week.  We discussed the following behavioral modification strategies today: meal planning and cooking strategies and keeping healthy foods in the home.  [name] has agreed to follow-up with our clinic (undetermined). He was informed of the importance of frequent follow-up visits to maximize his success with intensive lifestyle modifications for his multiple health conditions.   Objective:   Blood pressure 127/74, pulse 65, temperature 98.4 F (36.9 C), temperature source Oral, height 5\' 4"  (1.626 m), weight 240 lb (108.9 kg), SpO2 99 %. Body mass index is 41.2 kg/m.  General: Cooperative, alert, well developed, in no acute distress. HEENT: Conjunctivae and lids unremarkable. Neck: No  thyromegaly.  Cardiovascular: Regular rhythm.  Lungs: Normal work of breathing. Extremities: No edema.  Neurologic: No focal deficits.   Lab Results  Component Value Date   CREATININE 0.68 (L) 10/29/2018   BUN 15 10/29/2018   NA 139 10/29/2018   K 4.7 10/29/2018   CL 99 10/29/2018   CO2 26 10/29/2018   Lab Results  Component Value Date   ALT 18 10/29/2018   AST 16 10/29/2018   ALKPHOS 92 10/29/2018   BILITOT 0.4 10/29/2018   Lab Results  Component Value Date   HGBA1C 6.4 (H) 10/29/2018   HGBA1C 6.0 (H) 07/10/2018   HGBA1C 6.3 (H) 12/06/2017   HGBA1C 6.2 (H) 09/10/2017   HGBA1C 6.1 (H) 06/11/2017   Lab Results  Component Value Date   INSULIN 11.1 10/29/2018   INSULIN 13.5 07/10/2018   INSULIN 5.7 09/10/2017   INSULIN 15.5 02/04/2017   Lab Results  Component Value Date   TSH 1.620 02/04/2017   Lab Results  Component Value Date   CHOL 175 10/29/2018   HDL 61 10/29/2018   LDLCALC 101 (H) 10/29/2018   TRIG 67 10/29/2018   CHOLHDL 2.6 12/13/2016   Lab Results  Component Value Date   WBC 10.8 (H) 09/02/2017   HGB 15.8 09/02/2017   HCT 45.4 09/02/2017   MCV 86.5 09/02/2017   PLT 323 09/02/2017   No results found for: IRON, TIBC, FERRITIN   Ref. Range 10/29/2018 11:58  Vitamin D, 25-Hydroxy Latest Ref Range: 30.0 - 100.0 ng/mL 32.7    Attestation Statements:   Reviewed by clinician on day of visit: allergies, medications, problem list, medical history, surgical history, family history, social history, and previous encounter notes.  Corey Skains, am acting as Location manager for [employer], PA-C.  I have reviewed the above documentation for accuracy and completeness, and I agree with the above. Abby Potash, PA-C

## 2019-01-23 ENCOUNTER — Emergency Department (HOSPITAL_COMMUNITY): Payer: BLUE CROSS/BLUE SHIELD

## 2019-01-23 ENCOUNTER — Other Ambulatory Visit: Payer: Self-pay

## 2019-01-23 ENCOUNTER — Emergency Department (HOSPITAL_COMMUNITY)
Admission: EM | Admit: 2019-01-23 | Discharge: 2019-01-23 | Disposition: A | Discharge status: Home / Self Care | Payer: BLUE CROSS/BLUE SHIELD | Attending: Emergency Medicine | Admitting: Emergency Medicine

## 2019-01-23 ENCOUNTER — Encounter (HOSPITAL_COMMUNITY): Payer: Self-pay

## 2019-01-23 DIAGNOSIS — Y999 Unspecified external cause status: Secondary | ICD-10-CM | POA: Diagnosis not present

## 2019-01-23 DIAGNOSIS — Z791 Long term (current) use of non-steroidal anti-inflammatories (NSAID): Secondary | ICD-10-CM | POA: Diagnosis not present

## 2019-01-23 DIAGNOSIS — M25562 Pain in left knee: Secondary | ICD-10-CM | POA: Diagnosis not present

## 2019-01-23 DIAGNOSIS — Z7984 Long term (current) use of oral hypoglycemic drugs: Secondary | ICD-10-CM | POA: Diagnosis not present

## 2019-01-23 DIAGNOSIS — G44311 Acute post-traumatic headache, intractable: Secondary | ICD-10-CM | POA: Insufficient documentation

## 2019-01-23 DIAGNOSIS — M25561 Pain in right knee: Secondary | ICD-10-CM | POA: Insufficient documentation

## 2019-01-23 DIAGNOSIS — E119 Type 2 diabetes mellitus without complications: Secondary | ICD-10-CM | POA: Diagnosis not present

## 2019-01-23 DIAGNOSIS — Y92410 Unspecified street and highway as the place of occurrence of the external cause: Secondary | ICD-10-CM | POA: Insufficient documentation

## 2019-01-23 DIAGNOSIS — Y93I9 Activity, other involving external motion: Secondary | ICD-10-CM | POA: Diagnosis not present

## 2019-01-23 DIAGNOSIS — Z79899 Other long term (current) drug therapy: Secondary | ICD-10-CM | POA: Insufficient documentation

## 2019-01-23 DIAGNOSIS — I10 Essential (primary) hypertension: Secondary | ICD-10-CM | POA: Diagnosis not present

## 2019-01-23 DIAGNOSIS — S0990XA Unspecified injury of head, initial encounter: Secondary | ICD-10-CM | POA: Insufficient documentation

## 2019-01-23 MED ORDER — IBUPROFEN 200 MG PO TABS
600.0000 mg | ORAL_TABLET | Freq: Once | ORAL | Status: AC
  Administered 2019-01-23: 600 mg via ORAL
  Filled 2019-01-23: qty 3
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
## 2019-01-23 NOTE — Discharge Instructions (Signed)
Take Ibuprofen or Tylenol as needed for the next week. Take this medicine with food. Use a heating pad for sore muscles - use for 20 minutes several times a day Return for worsening symptoms

## 2019-01-23 NOTE — ED Notes (Signed)
Pt verbalizes understanding of DC instructions. Pt belongings returned and assisted in wheelchair out of ED.  

## 2019-03-18 ENCOUNTER — Other Ambulatory Visit (INDEPENDENT_AMBULATORY_CARE_PROVIDER_SITE_OTHER): Payer: Self-pay | Admitting: Physician Assistant

## 2019-03-18 DIAGNOSIS — E7849 Other hyperlipidemia: Secondary | ICD-10-CM

## 2019-03-18 DIAGNOSIS — E119 Type 2 diabetes mellitus without complications: Secondary | ICD-10-CM

## 2019-03-18 DIAGNOSIS — I10 Essential (primary) hypertension: Secondary | ICD-10-CM

## 2019-07-27 ENCOUNTER — Emergency Department (HOSPITAL_COMMUNITY): Payer: BLUE CROSS/BLUE SHIELD

## 2019-07-27 ENCOUNTER — Other Ambulatory Visit: Payer: Self-pay

## 2019-07-27 ENCOUNTER — Encounter (HOSPITAL_COMMUNITY): Payer: Self-pay | Admitting: Emergency Medicine

## 2019-07-27 ENCOUNTER — Emergency Department (HOSPITAL_COMMUNITY)
Admission: EM | Admit: 2019-07-27 | Discharge: 2019-07-27 | Disposition: A | Discharge status: Home / Self Care | Payer: BLUE CROSS/BLUE SHIELD | Attending: Emergency Medicine | Admitting: Emergency Medicine

## 2019-07-27 DIAGNOSIS — E119 Type 2 diabetes mellitus without complications: Secondary | ICD-10-CM | POA: Diagnosis not present

## 2019-07-27 DIAGNOSIS — I10 Essential (primary) hypertension: Secondary | ICD-10-CM | POA: Insufficient documentation

## 2019-07-27 DIAGNOSIS — Z79899 Other long term (current) drug therapy: Secondary | ICD-10-CM | POA: Diagnosis not present

## 2019-07-27 DIAGNOSIS — Z7984 Long term (current) use of oral hypoglycemic drugs: Secondary | ICD-10-CM | POA: Insufficient documentation

## 2019-07-27 DIAGNOSIS — Y92410 Unspecified street and highway as the place of occurrence of the external cause: Secondary | ICD-10-CM | POA: Insufficient documentation

## 2019-07-27 DIAGNOSIS — Y999 Unspecified external cause status: Secondary | ICD-10-CM | POA: Insufficient documentation

## 2019-07-27 DIAGNOSIS — S299XXA Unspecified injury of thorax, initial encounter: Secondary | ICD-10-CM | POA: Insufficient documentation

## 2019-07-27 DIAGNOSIS — Y9389 Activity, other specified: Secondary | ICD-10-CM | POA: Diagnosis not present

## 2019-07-27 LAB — COMPREHENSIVE METABOLIC PANEL
ALT: 20 U/L (ref 0–44)
AST: 35 U/L (ref 15–41)
Albumin: 4 g/dL (ref 3.5–5.0)
Alkaline Phosphatase: 68 U/L (ref 38–126)
Anion gap: 12 (ref 5–15)
BUN: 18 mg/dL (ref 6–20)
CO2: 25 mmol/L (ref 22–32)
Calcium: 8.8 mg/dL — ABNORMAL LOW (ref 8.9–10.3)
Chloride: 98 mmol/L (ref 98–111)
Creatinine, Ser: 0.83 mg/dL (ref 0.61–1.24)
GFR calc Af Amer: >60 mL/min (ref >60)
GFR calc non Af Amer: >60 mL/min (ref >60)
Glucose, Bld: 182 mg/dL — ABNORMAL HIGH (ref 70–99)
Potassium: 4.3 mmol/L (ref 3.5–5.1)
Sodium: 135 mmol/L (ref 135–145)
Total Bilirubin: 0.9 mg/dL (ref 0.3–1.2)
Total Protein: 7.2 g/dL (ref 6.5–8.1)

## 2019-07-27 LAB — CBC
HCT: 45.3 % (ref 39.0–52.0)
Hemoglobin: 14.7 g/dL (ref 13.0–17.0)
MCH: 28.8 pg (ref 26.0–34.0)
MCHC: 32.5 g/dL (ref 30.0–36.0)
MCV: 88.6 fL (ref 80.0–100.0)
Platelets: 267 10*3/uL (ref 150–400)
RBC: 5.11 MIL/uL (ref 4.22–5.81)
RDW: 12.9 % (ref 11.5–15.5)
WBC: 7.7 10*3/uL (ref 4.0–10.5)
nRBC: 0 % (ref 0.0–0.2)

## 2019-07-27 LAB — URINALYSIS, ROUTINE W REFLEX MICROSCOPIC
Bilirubin Urine: NEGATIVE
Glucose, UA: 50 mg/dL — AB
Hgb urine dipstick: NEGATIVE
Ketones, ur: 5 mg/dL — AB
Leukocytes,Ua: NEGATIVE
Nitrite: NEGATIVE
Protein, ur: NEGATIVE mg/dL
Specific Gravity, Urine: 1.024 (ref 1.005–1.030)
pH: 5 (ref 5.0–8.0)

## 2019-07-27 LAB — LIPASE, BLOOD: Lipase: 20 U/L (ref 11–51)

## 2019-07-27 MED ORDER — ACETAMINOPHEN 500 MG PO TABS
1000.0000 mg | ORAL_TABLET | Freq: Once | ORAL | Status: AC
  Administered 2019-07-27: 1000 mg via ORAL
  Filled 2019-07-27: qty 2

## 2019-07-27 MED ORDER — IOHEXOL 300 MG/ML  SOLN
100.0000 mL | Freq: Once | INTRAMUSCULAR | Status: AC | PRN
  Administered 2019-07-27: 100 mL via INTRAVENOUS

## 2019-07-27 MED ORDER — SODIUM CHLORIDE (PF) 0.9 % IJ SOLN
INTRAMUSCULAR | Status: AC
  Filled 2019-07-27: qty 50

## 2019-07-27 NOTE — Discharge Instructions (Addendum)
You can take 600 mg of ibuprofen every 6 hours, you can take 1000 mg of Tylenol every 6 hours, you can alternate these every 3 or you can take them together.  
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
## 2019-11-12 ENCOUNTER — Other Ambulatory Visit: Payer: Self-pay

## 2019-11-12 ENCOUNTER — Emergency Department (HOSPITAL_BASED_OUTPATIENT_CLINIC_OR_DEPARTMENT_OTHER)
Admission: EM | Admit: 2019-11-12 | Discharge: 2019-11-12 | Disposition: A | Discharge status: Home / Self Care | Payer: BLUE CROSS/BLUE SHIELD | Source: Home / Self Care | Attending: Emergency Medicine | Admitting: Emergency Medicine

## 2019-11-12 ENCOUNTER — Encounter (HOSPITAL_BASED_OUTPATIENT_CLINIC_OR_DEPARTMENT_OTHER): Payer: Self-pay | Admitting: Emergency Medicine

## 2019-11-12 ENCOUNTER — Other Ambulatory Visit (HOSPITAL_BASED_OUTPATIENT_CLINIC_OR_DEPARTMENT_OTHER): Payer: Self-pay | Admitting: Physician Assistant

## 2019-11-12 DIAGNOSIS — Z7984 Long term (current) use of oral hypoglycemic drugs: Secondary | ICD-10-CM | POA: Insufficient documentation

## 2019-11-12 DIAGNOSIS — Z79899 Other long term (current) drug therapy: Secondary | ICD-10-CM | POA: Insufficient documentation

## 2019-11-12 DIAGNOSIS — E119 Type 2 diabetes mellitus without complications: Secondary | ICD-10-CM | POA: Insufficient documentation

## 2019-11-12 DIAGNOSIS — Z20822 Contact with and (suspected) exposure to covid-19: Secondary | ICD-10-CM | POA: Insufficient documentation

## 2019-11-12 DIAGNOSIS — M5442 Lumbago with sciatica, left side: Secondary | ICD-10-CM

## 2019-11-12 DIAGNOSIS — I10 Essential (primary) hypertension: Secondary | ICD-10-CM | POA: Insufficient documentation

## 2019-11-12 DIAGNOSIS — M545 Low back pain, unspecified: Secondary | ICD-10-CM | POA: Diagnosis present

## 2019-11-12 MED ORDER — IBUPROFEN 600 MG PO TABS: 600.0000 mg | ORAL_TABLET | Freq: Four times a day (QID) | ORAL | 0 refills | Status: DC | PRN

## 2019-11-12 MED ORDER — OXYCODONE HCL 5 MG PO CAPS: 5.0000 mg | ORAL_CAPSULE | Freq: Four times a day (QID) | ORAL | 0 refills | Status: DC | PRN

## 2019-11-12 MED ORDER — KETOROLAC TROMETHAMINE 30 MG/ML IJ SOLN
60.0000 mg | Freq: Once | INTRAMUSCULAR | Status: AC
  Administered 2019-11-12: 60 mg via INTRAMUSCULAR
  Filled 2019-11-12: qty 2

## 2019-11-12 MED ORDER — OXYCODONE HCL 5 MG PO TABS
10.0000 mg | ORAL_TABLET | Freq: Once | ORAL | Status: AC
  Administered 2019-11-12: 10 mg via ORAL
  Filled 2019-11-12: qty 2

## 2019-11-12 MED FILL — oxyCODONE HCL 5 MG TABS: 5 | 4 days supply | Qty: 15 | Fill #0

## 2019-11-12 MED FILL — IBUPROFEN 600 MG TABLET: 600 | 8 days supply | Qty: 30 | Fill #0
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
## 2019-11-12 NOTE — Discharge Instructions (Signed)
Please take your medications, as directed.  Discontinue the tramadol given that you are now being given oxycodone.  Take the ibuprofen 600 mg every 6 hours, as prescribed.  Do not combine with other NSAIDs.  Please go to your appointment on 11/18/2019 for MRI imaging and pain management.  You will need to have your pain control moving forward with pain management.  Please return to the ED or seek immediate medical attention should you experience any new or worsening symptoms.  Specifically, any fevers or chills, radiation down both legs, difficulty peeing or incontinence, true numbness or weakness, or other symptoms.  You were given narcotic and or sedative medications while in the emergency department. Do not drive. Do not use machinery or power tools. Do not sign legal documents. Do not drink alcohol. Do not take sleeping pills. Do not supervise children by yourself. Do not participate in activities that require climbing or being in high places.

## 2019-11-12 NOTE — ED Triage Notes (Signed)
Reports recurrent lower right back pain, saw PCP 2 weeks ago , pain med no relief. Appointment for MRI this month and ortho appointment . Unable to wait due to severe pain. Tingling to toes.

## 2019-11-13 ENCOUNTER — Emergency Department (HOSPITAL_COMMUNITY): Payer: BLUE CROSS/BLUE SHIELD

## 2019-11-13 ENCOUNTER — Other Ambulatory Visit: Payer: Self-pay

## 2019-11-13 ENCOUNTER — Observation Stay (HOSPITAL_COMMUNITY)
Admission: EM | Admit: 2019-11-13 | Discharge: 2019-11-14 | Disposition: A | Discharge status: Home / Self Care | Payer: BLUE CROSS/BLUE SHIELD | Attending: Emergency Medicine | Admitting: Emergency Medicine

## 2019-11-13 ENCOUNTER — Encounter (HOSPITAL_COMMUNITY): Payer: Self-pay

## 2019-11-13 DIAGNOSIS — M5416 Radiculopathy, lumbar region: Secondary | ICD-10-CM

## 2019-11-13 DIAGNOSIS — M549 Dorsalgia, unspecified: Secondary | ICD-10-CM

## 2019-11-13 DIAGNOSIS — R202 Paresthesia of skin: Secondary | ICD-10-CM

## 2019-11-13 DIAGNOSIS — M5459 Other low back pain: Secondary | ICD-10-CM

## 2019-11-13 DIAGNOSIS — E119 Type 2 diabetes mellitus without complications: Secondary | ICD-10-CM

## 2019-11-13 DIAGNOSIS — M48061 Spinal stenosis, lumbar region without neurogenic claudication: Secondary | ICD-10-CM | POA: Diagnosis present

## 2019-11-13 DIAGNOSIS — I10 Essential (primary) hypertension: Secondary | ICD-10-CM | POA: Diagnosis present

## 2019-11-13 LAB — BASIC METABOLIC PANEL
Anion gap: 10 (ref 5–15)
BUN: 14 mg/dL (ref 6–20)
CO2: 25 mmol/L (ref 22–32)
Calcium: 8.9 mg/dL (ref 8.9–10.3)
Chloride: 102 mmol/L (ref 98–111)
Creatinine, Ser: 0.72 mg/dL (ref 0.61–1.24)
GFR, Estimated: >60 mL/min (ref >60)
Glucose, Bld: 136 mg/dL — ABNORMAL HIGH (ref 70–99)
Potassium: 3.9 mmol/L (ref 3.5–5.1)
Sodium: 137 mmol/L (ref 135–145)

## 2019-11-13 LAB — CBC WITH DIFFERENTIAL/PLATELET
Abs Immature Granulocytes: 0.02 10*3/uL (ref 0.00–0.07)
Basophils Absolute: 0.1 10*3/uL (ref 0.0–0.1)
Basophils Relative: 1 %
Eosinophils Absolute: 0.1 10*3/uL (ref 0.0–0.5)
Eosinophils Relative: 2 %
HCT: 44.2 % (ref 39.0–52.0)
Hemoglobin: 15.3 g/dL (ref 13.0–17.0)
Immature Granulocytes: 0 %
Lymphocytes Relative: 19 %
Lymphs Abs: 1.1 10*3/uL (ref 0.7–4.0)
MCH: 30.1 pg (ref 26.0–34.0)
MCHC: 34.6 g/dL (ref 30.0–36.0)
MCV: 87 fL (ref 80.0–100.0)
Monocytes Absolute: 0.5 10*3/uL (ref 0.1–1.0)
Monocytes Relative: 9 %
Neutro Abs: 4 10*3/uL (ref 1.7–7.7)
Neutrophils Relative %: 69 %
Platelets: 287 10*3/uL (ref 150–400)
RBC: 5.08 MIL/uL (ref 4.22–5.81)
RDW: 12.8 % (ref 11.5–15.5)
WBC: 5.8 10*3/uL (ref 4.0–10.5)
nRBC: 0 % (ref 0.0–0.2)

## 2019-11-13 LAB — CBG MONITORING, ED: Glucose-Capillary: 131 mg/dL — ABNORMAL HIGH (ref 70–99)

## 2019-11-13 LAB — GLUCOSE, CAPILLARY: Glucose-Capillary: 155 mg/dL — ABNORMAL HIGH (ref 70–99)

## 2019-11-13 LAB — RESPIRATORY PANEL BY RT PCR (FLU A&B, COVID)
Influenza A by PCR: NEGATIVE
Influenza B by PCR: NEGATIVE
SARS Coronavirus 2 by RT PCR: NEGATIVE

## 2019-11-13 MED ORDER — ENOXAPARIN SODIUM 40 MG/0.4ML ~~LOC~~ SOLN
40.0000 mg | SUBCUTANEOUS | Status: DC
  Administered 2019-11-13: 40 mg via SUBCUTANEOUS
  Filled 2019-11-13: qty 0.4

## 2019-11-13 MED ORDER — METHYLPREDNISOLONE SODIUM SUCC 125 MG IJ SOLR
125.0000 mg | Freq: Once | INTRAMUSCULAR | Status: AC
  Administered 2019-11-13: 125 mg via INTRAVENOUS
  Filled 2019-11-13: qty 2

## 2019-11-13 MED ORDER — KETOROLAC TROMETHAMINE 15 MG/ML IJ SOLN
15.0000 mg | Freq: Once | INTRAMUSCULAR | Status: AC
  Administered 2019-11-13: 15 mg via INTRAVENOUS
  Filled 2019-11-13: qty 1

## 2019-11-13 MED ORDER — METHOCARBAMOL 500 MG PO TABS: 1000.0000 mg | ORAL_TABLET | Freq: Once | ORAL | Status: DC

## 2019-11-13 MED ORDER — KETOROLAC TROMETHAMINE 15 MG/ML IJ SOLN
15.0000 mg | Freq: Four times a day (QID) | INTRAMUSCULAR | Status: AC
  Administered 2019-11-13: 15 mg via INTRAVENOUS
  Filled 2019-11-13: qty 1

## 2019-11-13 MED ORDER — HYDROMORPHONE HCL 1 MG/ML IJ SOLN: 1.0000 mg | Freq: Once | INTRAMUSCULAR | Status: DC

## 2019-11-13 MED ORDER — MORPHINE SULFATE (PF) 4 MG/ML IV SOLN
4.0000 mg | Freq: Once | INTRAVENOUS | Status: AC
  Administered 2019-11-13: 4 mg via INTRAVENOUS
  Filled 2019-11-13: qty 1

## 2019-11-13 MED ORDER — HYDROMORPHONE HCL 1 MG/ML IJ SOLN
1.0000 mg | Freq: Once | INTRAMUSCULAR | Status: AC
  Administered 2019-11-13: 1 mg via INTRAVENOUS
  Filled 2019-11-13: qty 1

## 2019-11-13 MED ORDER — LOSARTAN POTASSIUM 50 MG PO TABS
50.0000 mg | ORAL_TABLET | Freq: Every morning | ORAL | Status: DC
  Administered 2019-11-14: 50 mg via ORAL
  Filled 2019-11-13: qty 1

## 2019-11-13 MED ORDER — METFORMIN HCL 500 MG PO TABS
500.0000 mg | ORAL_TABLET | Freq: Every day | ORAL | Status: DC
  Administered 2019-11-14: 500 mg via ORAL
  Filled 2019-11-13: qty 1

## 2019-11-13 MED ORDER — ACETAMINOPHEN 650 MG RE SUPP: 650.0000 mg | Freq: Four times a day (QID) | RECTAL | Status: DC | PRN

## 2019-11-13 MED ORDER — ONDANSETRON HCL 4 MG/2ML IJ SOLN: 4.0000 mg | Freq: Four times a day (QID) | INTRAMUSCULAR | Status: DC | PRN

## 2019-11-13 MED ORDER — LORAZEPAM 2 MG/ML IJ SOLN
INTRAMUSCULAR | Status: AC
  Filled 2019-11-13: qty 1

## 2019-11-13 MED ORDER — PANTOPRAZOLE SODIUM 40 MG PO TBEC
40.0000 mg | DELAYED_RELEASE_TABLET | Freq: Every day | ORAL | Status: DC
  Administered 2019-11-13 – 2019-11-14 (×2): 40 mg via ORAL
  Filled 2019-11-13 (×2): qty 1

## 2019-11-13 MED ORDER — INSULIN ASPART 100 UNIT/ML ~~LOC~~ SOLN
0.0000 [IU] | Freq: Three times a day (TID) | SUBCUTANEOUS | Status: DC
  Filled 2019-11-13: qty 0.15

## 2019-11-13 MED ORDER — ACETAMINOPHEN 325 MG PO TABS: 650.0000 mg | ORAL_TABLET | Freq: Four times a day (QID) | ORAL | Status: DC | PRN

## 2019-11-13 MED ORDER — PREDNISONE 20 MG PO TABS
60.0000 mg | ORAL_TABLET | Freq: Every day | ORAL | Status: DC
  Administered 2019-11-14: 60 mg via ORAL
  Filled 2019-11-13: qty 3

## 2019-11-13 MED ORDER — INSULIN ASPART 100 UNIT/ML ~~LOC~~ SOLN
0.0000 [IU] | Freq: Every day | SUBCUTANEOUS | Status: DC
  Filled 2019-11-13: qty 0.05

## 2019-11-13 MED ORDER — ONDANSETRON HCL 4 MG PO TABS: 4.0000 mg | ORAL_TABLET | Freq: Four times a day (QID) | ORAL | Status: DC | PRN

## 2019-11-13 MED ORDER — LIDOCAINE 5 % EX PTCH
1.0000 | MEDICATED_PATCH | CUTANEOUS | Status: DC
  Administered 2019-11-13 – 2019-11-14 (×2): 1 via TRANSDERMAL
  Filled 2019-11-13 (×2): qty 1

## 2019-11-13 MED ORDER — HYDROMORPHONE HCL 1 MG/ML IJ SOLN
1.0000 mg | INTRAMUSCULAR | Status: AC | PRN
  Administered 2019-11-13: 1 mg via INTRAVENOUS
  Filled 2019-11-13 (×2): qty 1

## 2019-11-13 MED ORDER — ONDANSETRON HCL 4 MG/2ML IJ SOLN
4.0000 mg | Freq: Once | INTRAMUSCULAR | Status: AC
  Administered 2019-11-13: 4 mg via INTRAVENOUS
  Filled 2019-11-13: qty 2

## 2019-11-13 MED ORDER — ATORVASTATIN CALCIUM 10 MG PO TABS: 10.0000 mg | ORAL_TABLET | Freq: Every day | ORAL | Status: DC

## 2019-11-13 NOTE — H&P (Signed)
History and Physical    Matthew Garner OFB:[id number] DOB: 03/15/1960 DOA: 11/13/2019  PCP: System, Provider Not In   Patient coming from: Home  Chief Complaint: Low back pain that radiates down left leg.   HPI: Matthew Garner is a 59 y.o. male with medical history significant for DMT2, HTN, HLD, DDD lumbar spine who presents for evaluation of low back pain that radiates into left leg. He reports that pain started a month ago and has worsened in past few days. He describes the pain as a severe constant pain starting at his left gluteal muscles and shoots down his left leg. He has not found any alleviating factors and states pain is worsened with movement of left leg/hip and palpation. He reports he has not been able to ambulate due to the pain. He has not had any injury or trauma that caused the pain initially. He does report having similar episode of low back pain that involved his right leg in 2019 that was successfully treated with epidural injection. He is currently being followed at [facility] for his low back pain.  He is diabetic and only uses metformin once a day. Reports sugars are well controlled.  He has required multiple doses of IV narcotic pain medication in the emergency room today and still has pain of 9 out of 10 and not able to ambulate. Hospital service was asked to admit for further management.  ER provider did discuss case with on-call neurosurgery who reviewed films from the MRI and say there is no urgent or emergent surgical intervention that need to be undertaken. Patient denies tobacco, alcohol, illicit drug use  Review of Systems:  General: Denies weakness, fever, chills, weight loss, night sweats.  Denies dizziness.  Denies change in appetite HENT: Denies head trauma, headache, denies change in hearing, tinnitus.  Denies nasal congestion or bleeding.  Denies sore throat, sores in mouth.  Eyes: Denies blurry vision, pain in eye, drainage.  Denies  discoloration of eyes. Neck: Denies pain.  Denies swelling.  Denies pain with movement. Cardiovascular: Denies chest pain, palpitations.  Denies edema.  Denies orthopnea Respiratory: Denies shortness of breath, cough.  Denies wheezing.  Denies sputum production Gastrointestinal: Denies abdominal pain, swelling.  Denies nausea, vomiting, diarrhea.  Denies melena.  Denies hematemesis. Musculoskeletal: Denies limitation of movement.  Denies deformity or swelling.  Denies pain.  Denies arthralgias or myalgias. Genitourinary: Denies pelvic pain.  Denies urinary frequency or hesitancy.  Denies dysuria.  Skin: Denies rash.  Denies petechiae, purpura, ecchymosis. Neurological: Reports pain, weakness and numbness of left lower extremity. Unable to ambulate due to left leg pain/weakness. Denies headache.  Denies syncope.  Denies seizure activity. Denies slurred speech, drooping face.  Denies visual change. Psychiatric: Denies depression, anxiety.  Denies suicidal thoughts or ideation.  Denies hallucinations.  Past Medical History:  Diagnosis Date  . Allergy    environmental  . Anemia   . Back pain   . Diabetes mellitus   . Environmental allergies 11/27/2011  . GERD (gastroesophageal reflux disease)   . Hyperlipidemia   . Hypertension   . Joint pain   . Knee pain, bilateral   . Sebaceous cyst 11/10/2012  . Snores     Past Surgical History:  Procedure Laterality Date  . APPENDECTOMY    . KNEE SURGERY     right  . MASS EXCISION Bilateral 11/24/2012   Procedure: EXCISION SCALP MASS X2;  Surgeon: [name], MD;  Location: [place];  Service: General;  Laterality: Bilateral;  . SPINE SURGERY  2004   lumb  . UPPER GASTROINTESTINAL ENDOSCOPY      Social History  reports that he has never smoked. He has never used smokeless tobacco. He reports that he does not drink alcohol and does not use drugs.  No Known Allergies  Family History  Problem Relation Age of Onset    . Colon cancer Neg Hx   . Colon polyps Neg Hx      Prior to Admission medications   Medication Sig Start Date End Date Taking? Authorizing Provider  atorvastatin (LIPITOR) 10 MG tablet Take 1 tablet (10 mg total) by mouth daily at 6 PM. 12/22/18  Yes Abby Potash, PA-C  ibuprofen (ADVIL) 600 MG tablet Take 1 tablet (600 mg total) by mouth every 6 (six) hours as needed. Patient taking differently: Take 600 mg by mouth every 6 (six) hours as needed for fever, headache or mild pain.  11/12/19  Yes Corena Herter, PA-C  losartan (COZAAR) 50 MG tablet Take 1 tablet (50 mg total) by mouth every morning. 12/22/18  Yes Abby Potash, PA-C  Menthol-Methyl Salicylate (MUSCLE RUB) 10-15 % CREA Apply 1 application topically as needed for muscle pain.   Yes [provider]  metFORMIN (GLUCOPHAGE) 500 MG tablet Take 1 tablet (500 mg total) by mouth daily with breakfast. 12/22/18  Yes Abby Potash, PA-C  omeprazole (PRILOSEC) 40 MG capsule Take 1 capsule (40 mg total) by mouth daily. 11/19/18  Yes Abby Potash, PA-C  oxycodone (OXY-IR) 5 MG capsule Take 1 capsule (5 mg total) by mouth every 6 (six) hours as needed for up to 15 doses. Patient taking differently: Take 5 mg by mouth every 6 (six) hours as needed for pain.  11/12/19  Yes Corena Herter, PA-C  sildenafil (VIAGRA) 100 MG tablet TAKE 1/2 TABLET BY MOUTH DAILY AS NEEDED FOR ERECTILE DYSFUNCTION. Patient taking differently: Take 50 mg by mouth as needed for erectile dysfunction.  01/13/19  Yes Abby Potash, PA-C  traMADol (ULTRAM) 50 MG tablet Take 50 mg by mouth every 6 (six) hours as needed for pain. 10/29/19  Yes [provider]  glucose blood test strip 1 each by Other route as needed for other. Use as instructed 01/13/19   Abby Potash, PA-C    Physical Exam: Vitals:   11/13/19 1415 11/13/19 1615 11/13/19 1704 11/13/19 1828  BP:  (!) 141/65 132/66 125/72  Pulse: 62 (!) 57 66 61  Resp:  18    Temp:       TempSrc:      SpO2: 96% 100% 100% 100%  Weight:      Height:        Constitutional: NAD, calm, comfortable Vitals:   11/13/19 1415 11/13/19 1615 11/13/19 1704 11/13/19 1828  BP:  (!) 141/65 132/66 125/72  Pulse: 62 (!) 57 66 61  Resp:  18    Temp:      TempSrc:      SpO2: 96% 100% 100% 100%  Weight:      Height:       General: WDWN, Alert and oriented x3.  Eyes: EOMI, PERRL, lids and conjunctivae normal.  Sclera nonicteric HENT:  [place]/AT, external ears normal.  Nares patent without epistasis. Mucous membranes are moist. Posterior pharynx clear of any exudate or lesions. Neck: Soft, normal range of motion, supple, no masses, no thyromegaly.  Trachea midline Respiratory: clear to auscultation bilaterally, no wheezing, no crackles. Normal respiratory effort. No accessory  muscle use.  Cardiovascular: Regular rate and rhythm, no murmurs / rubs / gallops. No extremity edema. 2+ pedal pulses.  Abdomen: Soft, no tenderness, nondistended, no rebound or guarding. Obese.  No masses palpated. Bowel sounds normoactive Musculoskeletal: LROM left leg due to pain. FROM right lower extremity and bilateral upper extremities. Tenderness to palpation of left lateral gluteal region and left lower extremity.  no clubbing / cyanosis. No joint deformity upper and lower extremities. Normal muscle tone.  Skin: Warm, dry, intact no rashes, lesions, ulcers. No induration Neurologic: CN 2-12 grossly intact.  Normal speech.  Sensation decreased to touch in left lower extremity.  patella DTR +1 bilaterally. Strength 4/5 left lower extremity, 5/5 strength in right lower and bilateral upper extremities.  Psychiatric: Normal judgment and insight.  Normal mood.    Labs on Admission: I have personally reviewed following labs and imaging studies  CBC: Recent Labs  Lab 11/13/19 1702  WBC 5.8  NEUTROABS 4.0  HGB 15.3  HCT 44.2  MCV 87.0  PLT 681    Basic Metabolic Panel: Recent Labs  Lab 11/13/19 1702  NA  137  K 3.9  CL 102  CO2 25  GLUCOSE 136*  BUN 14  CREATININE 0.72  CALCIUM 8.9    GFR: Estimated Creatinine Clearance: 118.3 mL/min (by C-G formula based on SCr of 0.72 mg/dL).  Liver Function Tests: No results for input(s): AST, ALT, ALKPHOS, BILITOT, PROT, ALBUMIN in the last 168 hours.  Urine analysis:    Component Value Date/Time   COLORURINE YELLOW 07/27/2019 1742   APPEARANCEUR CLEAR 07/27/2019 1742   LABSPEC 1.024 07/27/2019 1742   PHURINE 5.0 07/27/2019 1742   GLUCOSEU 50 (A) 07/27/2019 1742   HGBUR NEGATIVE 07/27/2019 1742   BILIRUBINUR NEGATIVE 07/27/2019 1742   BILIRUBINUR neg 12/08/2012 1112   KETONESUR 5 (A) 07/27/2019 1742   PROTEINUR NEGATIVE 07/27/2019 1742   UROBILINOGEN 0.2 12/12/2012 1446   NITRITE NEGATIVE 07/27/2019 1742   LEUKOCYTESUR NEGATIVE 07/27/2019 1742    Radiological Exams on Admission: DG Lumbar Spine Complete  Result Date: 11/13/2019 CLINICAL DATA:  Left-sided sciatica type pain EXAM: LUMBAR SPINE - COMPLETE 4+ VIEW COMPARISON:  December 20, 2018 FINDINGS: Frontal, lateral, spot lumbosacral lateral, and bilateral oblique views were obtained. There are 5 non-rib-bearing lumbar type vertebral bodies. There is no fracture or spondylolisthesis. There is moderate disc space narrowing at L1-2, L4-5, L5-S1, stable. There is facet osteoarthritic change at L4-5 and L3-4 bilaterally and at L5-S1 on the right. IMPRESSION: Osteoarthritic change at several levels, similar to prior study. No fracture or spondylolisthesis. Electronically Signed   By: Lowella Grip III M.D.   On: 11/13/2019 07:55   MR LUMBAR SPINE WO CONTRAST  Result Date: 11/13/2019 CLINICAL DATA:  Low back pain with left-sided radiculopathy EXAM: MRI LUMBAR SPINE WITHOUT CONTRAST MRI SACRUM WITHOUT CONTRAST TECHNIQUE: Multiplanar, multisequence MR imaging of the lumbar spine and sacrum was performed. No intravenous contrast was administered. COMPARISON:  MRI 05/09/2014, CT 07/27/2019  FINDINGS: LUMBAR SPINE: Technical note: Examination is mildly degraded by patient motion artifact. Segmentation: Standard. Alignment:  Physiologic. Vertebrae:  No fracture, evidence of discitis, or bone lesion. Conus medullaris and cauda equina: Conus extends to the L1 level. Conus and cauda equina appear normal. Paraspinal and other soft tissues: Negative. Disc levels: T12-L1: Sagittal sequences only. No evidence of foraminal or canal stenosis. L1-L2: Mild diffuse disc bulge without foraminal or canal stenosis. Unchanged. L2-L3: Mild diffuse disc bulge without foraminal or canal stenosis. Unchanged. L3-L4: Circumferential disc  bulge. Slight worsening of bilateral facet arthropathy. Ligamentum flavum buckling. There is moderate canal stenosis and mild left greater than right foraminal stenosis. Findings slightly progressed from prior. L4-L5: Mild diffuse disc bulge with bilateral facet arthrosis. Soft tissue edema adjacent to the bilateral facet joints, right worse than left. No significant canal stenosis. Mild bilateral foraminal stenosis, right worse than left. Unchanged. L5-S1: Mild circumferential disc bulge with suspected progression of a left foraminal protrusion. Bilateral facet arthropathy, also appears slightly progressed. There is no canal stenosis. There is mild right and moderate to severe left foraminal stenosis. Overall, findings have slightly progressed from prior. SACRUM: Bones/Joint/Cartilage No acute fracture. Bilateral sacroiliac joints are intact without diastasis. No significant SI joint arthropathy. No edema or erosions to suggest sacroiliitis. No SI joint effusion. Ligaments Intact. Muscles and Tendons The visualized bilateral hamstring tendon origins demonstrate mild tendinosis. Normal muscle bulk and signal intensity without edema, atrophy, or fatty infiltration. Soft tissues No soft tissue ulceration. No significant soft tissue edema. No fluid collection. IMPRESSION: 1. Multilevel  degenerative changes of the lumbar spine with moderate canal stenosis at L3-L4, slightly progressed from prior. 2. Moderate-to-severe left foraminal stenosis at L5-S1. This finding may contribute to patient's left-sided radicular symptoms. 3. Soft tissue edema adjacent to the bilateral facet joints at L4-L5, which can be a source of back pain. 4. Unremarkable MRI of the sacrum. No evidence of sacroiliitis. Electronically Signed   By: Davina Poke D.O.   On: 11/13/2019 15:31   MR SACRUM SI JOINTS WO CONTRAST  Result Date: 11/13/2019 CLINICAL DATA:  Low back pain with left-sided radiculopathy EXAM: MRI LUMBAR SPINE WITHOUT CONTRAST MRI SACRUM WITHOUT CONTRAST TECHNIQUE: Multiplanar, multisequence MR imaging of the lumbar spine and sacrum was performed. No intravenous contrast was administered. COMPARISON:  MRI 05/09/2014, CT 07/27/2019 FINDINGS: LUMBAR SPINE: Technical note: Examination is mildly degraded by patient motion artifact. Segmentation: Standard. Alignment:  Physiologic. Vertebrae:  No fracture, evidence of discitis, or bone lesion. Conus medullaris and cauda equina: Conus extends to the L1 level. Conus and cauda equina appear normal. Paraspinal and other soft tissues: Negative. Disc levels: T12-L1: Sagittal sequences only. No evidence of foraminal or canal stenosis. L1-L2: Mild diffuse disc bulge without foraminal or canal stenosis. Unchanged. L2-L3: Mild diffuse disc bulge without foraminal or canal stenosis. Unchanged. L3-L4: Circumferential disc bulge. Slight worsening of bilateral facet arthropathy. Ligamentum flavum buckling. There is moderate canal stenosis and mild left greater than right foraminal stenosis. Findings slightly progressed from prior. L4-L5: Mild diffuse disc bulge with bilateral facet arthrosis. Soft tissue edema adjacent to the bilateral facet joints, right worse than left. No significant canal stenosis. Mild bilateral foraminal stenosis, right worse than left. Unchanged.  L5-S1: Mild circumferential disc bulge with suspected progression of a left foraminal protrusion. Bilateral facet arthropathy, also appears slightly progressed. There is no canal stenosis. There is mild right and moderate to severe left foraminal stenosis. Overall, findings have slightly progressed from prior. SACRUM: Bones/Joint/Cartilage No acute fracture. Bilateral sacroiliac joints are intact without diastasis. No significant SI joint arthropathy. No edema or erosions to suggest sacroiliitis. No SI joint effusion. Ligaments Intact. Muscles and Tendons The visualized bilateral hamstring tendon origins demonstrate mild tendinosis. Normal muscle bulk and signal intensity without edema, atrophy, or fatty infiltration. Soft tissues No soft tissue ulceration. No significant soft tissue edema. No fluid collection. IMPRESSION: 1. Multilevel degenerative changes of the lumbar spine with moderate canal stenosis at L3-L4, slightly progressed from prior. 2. Moderate-to-severe left foraminal stenosis at L5-S1. This  finding may contribute to patient's left-sided radicular symptoms. 3. Soft tissue edema adjacent to the bilateral facet joints at L4-L5, which can be a source of back pain. 4. Unremarkable MRI of the sacrum. No evidence of sacroiliitis. Electronically Signed   By: Davina Poke D.O.   On: 11/13/2019 15:31    EKG: Independently reviewed. EKG shows sinus bradycardia with no acute ST elevation or depression. QTc is 417.   Assessment/Plan Principal Problem:   Intractable low back pain Mr. Michelle is placed on med/surg floor for observation. He will be given two doses of toradol overnight to assist with pain control.  He was given one dose of solumedrol in the ER. Start PO prednisone in am for next few days.  Dilaudid ordered for breakthrough severe pain.  Consult Neurosurgery, discussed with Dr. Christella Noa who will see him tonight.   Active Problems:   Spinal stenosis of lumbar region MRI results reviewed.  No acute emergency intervention required. Pt has had epidural injection in 2019 for similar back pain on right side which helped.  Recommend repeat epidural injection per discretion of neurosurgery as to done as inpatient or outpatient.     Hypertension Continue Losartan. Monitor BP.     Type 2 diabetes mellitus without complication, without long-term current use of insulin (HCC) Pt with controlled DMT2. Had HgbA1c two weeks ago that was 6.8.  Continue metformin.  SSI as needed for glycemic control with steroid therapy for back pain.    Paresthesia of left leg Secondary to spinal stenosis as evidenced on MRI lumbar spine   DVT prophylaxis: Padua score is elevated with immobility and obesity. Lovenox for DVT prophylaxis.  Code Status:   Full Code  Family Communication:  Diagnosis and plan discussed with patient.  Patient verbalized understanding and agrees with plan.  Questions answered.  Further recommendations to follow as clinically indicated Disposition Plan:   Patient is from:  Home  Anticipated DC to:  Home  Anticipated DC date:  Anticipated discharge in less than 2 midnights.  Anticipated DC barriers: No barriers to discharge identified at this time  Consults called:  Neurosurgery, Dr. Christella Noa Admission status:  Observation   Yevonne Aline Jafet Wissing MD [facility]  How to contact the [facility] Attending or Consulting provider [place] or covering provider during after hours [place], for this patient?   1. Check the care team in [facility] and look for a) attending/consulting [facility] provider listed and b) the [facility] team listed 2. Log into [email or web address] and use [place]'s universal password to access. If you do not have the password, please contact the hospital operator. 3. Locate the [facility], The provider you are looking for under [facility] and page to a number that you can be directly reached. 4. If you still have difficulty reaching the provider, please page the [facility] (Director on Call) for  the Hospitalists listed on amion for assistance.  11/13/2019, 7:51 PM

## 2019-11-13 NOTE — ED Notes (Signed)
Pt refused ambulation to assess pulse oximetry.

## 2019-11-13 NOTE — ED Provider Notes (Addendum)
[facility] DEPT Provider Note   CSN: [id number] Arrival date & time: 11/12/19  2354     History Chief Complaint  Patient presents with  . Back Pain    Isom Kochan is a 59 y.o. male history of obesity, GERD, diabetes, hypertension, hyperlipidemia, lumbar spinal stenosis.  Presents today for left lower back pain that has been ongoing for over a month acutely worsening over the past day.  He describes severe constant pain starting at his left gluteal muscles shooting down his left leg no alleviating factors worsened with movement and palpation.  Patient reports he was seen in the emergency department yesterday for the same but pain has not improved.  Denies fall/injury, fever/chills, saddle paresthesias, bowel/bladder incontinence, urinary retention, IV drug use, history of cancer, abdominal pain, dysuria/hematuria or any additional concerns.  HPI     Past Medical History:  Diagnosis Date  . Allergy    environmental  . Anemia   . Back pain   . Diabetes mellitus   . Environmental allergies 11/27/2011  . GERD (gastroesophageal reflux disease)   . Hyperlipidemia   . Hypertension   . Joint pain   . Knee pain, bilateral   . Sebaceous cyst 11/10/2012  . Snores     Patient Active Problem List   Diagnosis Date Noted  . Type 2 diabetes mellitus without complication, without long-term current use of insulin ([place]) 03/04/2017  . Vitamin D deficiency 03/04/2017  . Other fatigue 02/04/2017  . Shortness of breath on exertion 02/04/2017  . Knee pain, bilateral 03/29/2015  . Nonspecific abnormal electrocardiogram (ECG) (EKG) 04/21/2014  . Spinal stenosis of lumbar region 10/27/2013  . Pain of left calf 06/16/2013  . Diabetes ([place]) 11/27/2011  . Hyperlipidemia 11/27/2011  . Hypertension 11/27/2011    Past Surgical History:  Procedure Laterality Date  . APPENDECTOMY    . KNEE SURGERY     right  . MASS EXCISION Bilateral 11/24/2012   Procedure:  EXCISION SCALP MASS X2;  Surgeon: [name], MD;  Location: [facility];  Service: General;  Laterality: Bilateral;  . SPINE SURGERY  2004   lumb  . UPPER GASTROINTESTINAL ENDOSCOPY         Family History  Problem Relation Age of Onset  . Colon cancer Neg Hx   . Colon polyps Neg Hx     Social History   Tobacco Use  . Smoking status: Never Smoker  . Smokeless tobacco: Never Used  Vaping Use  . Vaping Use: Never used  Substance Use Topics  . Alcohol use: No  . Drug use: No    Home Medications Prior to Admission medications   Medication Sig Start Date End Date Taking? Authorizing Provider  atorvastatin (LIPITOR) 10 MG tablet Take 1 tablet (10 mg total) by mouth daily at 6 PM. 12/22/18   Abby Potash, PA-C  glucose blood test strip 1 each by Other route as needed for other. Use as instructed 01/13/19   Abby Potash, PA-C  ibuprofen (ADVIL) 600 MG tablet Take 1 tablet (600 mg total) by mouth every 6 (six) hours as needed. 11/12/19   Corena Herter, PA-C  losartan (COZAAR) 50 MG tablet Take 1 tablet (50 mg total) by mouth every morning. 12/22/18   Abby Potash, PA-C  metFORMIN (GLUCOPHAGE) 500 MG tablet Take 1 tablet (500 mg total) by mouth daily with breakfast. 12/22/18   Abby Potash, PA-C  naproxen (NAPROSYN) 500 MG tablet Take 1 tablet (500 mg total) by  mouth 2 (two) times daily. 12/20/18   Alveria Apley, PA-C  omeprazole (PRILOSEC) 40 MG capsule Take 1 capsule (40 mg total) by mouth daily. 11/19/18   Abby Potash, PA-C  oxycodone (OXY-IR) 5 MG capsule Take 1 capsule (5 mg total) by mouth every 6 (six) hours as needed for up to 15 doses. 11/12/19   Corena Herter, PA-C  sildenafil (VIAGRA) 100 MG tablet TAKE 1/2 TABLET BY MOUTH DAILY AS NEEDED FOR ERECTILE DYSFUNCTION. 01/13/19   Abby Potash, PA-C  Vitamin D, Ergocalciferol, (DRISDOL) 1.25 MG (50000 UT) CAPS capsule Take 1 capsule (50,000 Units total) by mouth every 7 (seven) days.  12/22/18   Abby Potash, PA-C    Allergies    Patient has no known allergies.  Review of Systems   Review of Systems  Constitutional: Negative.  Negative for chills and fever.  Gastrointestinal: Negative.  Negative for abdominal pain.  Musculoskeletal: Positive for back pain. Negative for neck pain.  Neurological: Negative for weakness and numbness.       Denies saddle area paresthesias. Denies bowel/bladder incontinence. Denies urinary retention.    Physical Exam Updated Vital Signs BP 104/77   Pulse (!) 49   Temp 98 F (36.7 C)   Resp (!) 26   Ht 5\' 5"  (1.651 m)   Wt 113 kg   SpO2 100%   BMI 41.46 kg/m   Physical Exam Constitutional:      General: He is not in acute distress.    Appearance: Normal appearance. He is well-developed. He is not ill-appearing or diaphoretic.  HENT:     Head: Normocephalic and atraumatic.  Eyes:     General: Vision grossly intact. Gaze aligned appropriately.     Pupils: Pupils are equal, round, and reactive to light.  Neck:     Trachea: Trachea and phonation normal.  Cardiovascular:     Pulses:          Dorsalis pedis pulses are 2+ on the right side and 2+ on the left side.  Pulmonary:     Effort: Pulmonary effort is normal. No respiratory distress.  Abdominal:     General: There is no distension.     Palpations: Abdomen is soft.     Tenderness: There is no abdominal tenderness. There is no guarding or rebound.  Musculoskeletal:        General: Normal range of motion.     Cervical back: Normal range of motion.     Comments: No midline C/T/L spinal tenderness to palpation, no deformity, crepitus, or step-off noted. - Left gluteal muscular tenderness to palpation without overlying skin change.  Positive straight leg raise.   Feet:     Right foot:     Protective Sensation: 5 sites tested. 5 sites sensed.     Left foot:     Protective Sensation: 5 sites tested. 5 sites sensed.  Skin:    General: Skin is warm and dry.    Neurological:     Mental Status: He is alert.     GCS: GCS eye subscore is 4. GCS verbal subscore is 5. GCS motor subscore is 6.     Comments: Speech is clear and goal oriented, follows commands Major Cranial nerves without deficit, no facial droop Normal strength in upper and lower extremities bilaterally including dorsiflexion and plantar flexion, strong and equal grip strength Sensation normal to light and sharp touch Moves extremities without ataxia, coordination intact No clonus of the feet  Psychiatric:  Behavior: Behavior normal.     ED Results / Procedures / Treatments   Labs (all labs ordered are listed, but only abnormal results are displayed) Labs Reviewed - No data to display  EKG EKG Interpretation  Date/Time:  Friday November 13 2019 07:55:53 EST Ventricular Rate:  48 PR Interval:    QRS Duration: 104 QT Interval:  466 QTC Calculation: 417 R Axis:   67 Text Interpretation: Sinus bradycardia Prolonged PR interval Low voltage, precordial leads Since last tracing rate slower Confirmed by Dorie Rank [phone]) on 11/13/2019 8:10:46 AM   Radiology No results found.  Procedures Procedures (including critical care time)  Medications Ordered in ED Medications  morphine 4 MG/ML injection 4 mg (4 mg Intravenous Given 11/13/19 0703)    ED Course  I have reviewed the triage vital signs and the nursing notes.  Pertinent labs & imaging results that were available during my care of the patient were reviewed by me and considered in my medical decision making (see chart for details).    MDM Rules/Calculators/A&P                         Additional history obtained from: 1. Nursing notes from this visit. 2. Review of electronic medical records.  Patient had ER visit yesterday, diagnosis left low back pain with left sciatica was given 60 mg Toradol and 10 mg oxycodone while in the ED.  Last imaging of lumbar spine was December 20, 2018 which showed degenerative  changes during her ER visit with diagnosis of acute left-sided low back pain. ------------------------------ 59 year old male presents with acute on chronic left-sided sciatica no traumatic injury.  Attempted treatment yesterday without improvement.  He returns today for continued symptoms.  Last image of the lumbar spine was a plain film x-ray last year, will obtain a repeat lumbar spine x-ray for assessment.  He has no neurologic complaint to suggest cauda equina.  Additionally no abdominal pain, flank pain or urinary symptoms additionally has no infectious symptoms or history of IV drug use.  Doubt AAA, kidney stone, dissection, spinal epidural abscess or other emergent etiologies at this time. - DG lumbar spine:  IMPRESSION:  Osteoarthritic change at several levels, similar to prior study. No  fracture or spondylolisthesis.   Suspect osteoarthritic changes seen on x-ray today to be etiology of patient's sciatica.  Attempted multiple rounds of narcotic pain medication with minimal improvement.  Patient refusing to ambulate and is concerned for discharge.  Discussed case with Dr. Tomi Bamberger, will obtain MRI for further evaluation.  Incidentally patient noted to be in sinus bradycardia, EKG obtained by nursing staff shows no emergent findings.  EKG: Sinus bradycardia Prolonged PR interval Low voltage, precordial leads Since last tracing rate slower Confirmed by Dorie Rank [phone]) on 11/13/2019 8:10:46 AM - Patient reassessed holding his left gluteal area reporting continued pain.  Additional dose pain medication ordered.  He states understanding of care plan and is agreeable for MRI.  He denies any pacemaker or metallic implants. ---------- Care handoff given to [facility] PA-C at shift change.  Plan of care is to follow-up on MRI studies and basic labs.  Patient may need admission for pain control.  Final disposition per oncoming team.   Note: Portions of this report may have been transcribed using  voice recognition software. Every effort was made to ensure accuracy; however, inadvertent computerized transcription errors may still be present. Final Clinical Impression(s) / ED Diagnoses Final diagnoses:  None  Rx / DC Orders ED Discharge Orders    None       Gari Crown 11/13/19 1506    Gari Crown 11/13/19 1507    Dorie Rank, MD 11/13/19 [phone]

## 2019-11-13 NOTE — ED Triage Notes (Signed)
Pt reports lower back pain radiating down left leg. Pain has been consistent for 4 weeks. Pt sts he received medications from PCP with no improvement. Seen at [facility] today and received medications with no improvement.

## 2019-11-13 NOTE — ED Notes (Signed)
Hourly rounding complete. Pt resting comfortably with bed in lowest position and side rails up x2. Vitals updated. All needs met at this time. Will continue to monitor. 

## 2019-11-13 NOTE — ED Provider Notes (Signed)
4:02 PM signout from [facility] at shift change.  Patient here with left-sided lumbar radiculopathy symptoms, uncontrolled, consistent with an L5 distribution.  Patient does report that he was unable to raise his foot yesterday.  Symptoms have been present for about 4 weeks, worsening and progressing to the point where he can no longer walk.  He was seen in the emergency department yesterday, prescribed oxycodone and NSAIDs after being given a shot of Toradol and oral oxycodone.  He did not do well overnight and presented to the emergency department at midnight.  He has received multiple doses of parenteral narcotics as well as a Lidoderm patch today without improvement.   MRI was ordered by previous team.    I went and reassessed the patient.  He still looks very uncomfortable, wincing in pain.  He cannot walk at this point.  I am waiting for labs to be performed.  Discussed use of IV steroids with patient in setting of his diabetes.  If his blood sugars are reasonably controlled now, we agreed to proceed with IV steroids.  I will also give a dose of Robaxin and Toradol.  I think that the patient will need admission for pain control and possible neurosurgery consultation.  Will discuss with on-call neurosurgery.  Patient reports previous surgery in 2003 in his lower back, is unable to tell me what he was done.  No hardware noted on MRI.  MRI: 1. Multilevel degenerative changes of the lumbar spine with moderate canal stenosis at L3-L4, slightly progressed from prior. 2. Moderate-to-severe left foraminal stenosis at L5-S1.  BP (!) 164/85   Pulse 62   Temp 98 F (36.7 C)   Resp (!) 24   Ht 5\' 5"  (1.651 m)   Wt 113 kg   SpO2 96%   BMI 41.46 kg/m   4:15 PM Patient actively vomiting. Has not yet received additional medications. I ordered zofran. Cancelled IV dilaudid and PO robaxin for now.   7:18 PM Patient was updated on results.  He just recently received IV steroids.  Still appears  uncomfortable, but does not need additional pain medications at this time.  I spoke with Dr. Tonie Griffith of [facility] who has agreed to see patient.   BP 125/72   Pulse 61   Temp 98 F (36.7 C)   Resp 18   Ht 5\' 5"  (1.651 m)   Wt 113 kg   SpO2 100%   BMI 41.46 kg/m        Carlisle Cater, PA-C 11/13/19 1919    Maudie Flakes, MD 11/13/19 2332

## 2019-11-13 NOTE — Consult Note (Signed)
[name] was admitted to the Medical service at [facility] long for intractable back pain. He underwent a right L4/5 discetomy in 2004 by my former partner Jovita Gamma. I saw him last in 2015 when he wished to have epidural steroid injections for his back pain. He never returned to our office after that visit. He has been treated at [facility], and in their medical system. He was referred to an orthopaedic surgeon with an appointment on 11/23/2019.  He has not requested a change in care. He states he lives closer to [facility] and this is why he came here. His back and left lower extremity have been problematic for a few weeks. He remains bowel and bladder continent. No neurological findings on his exam by the ED physicians either today or yesterday when he went to the Emergency department.  No Known Allergies Past Medical History:  Diagnosis Date  . Allergy    environmental  . Anemia   . Back pain   . Diabetes mellitus   . Environmental allergies 11/27/2011  . GERD (gastroesophageal reflux disease)   . Hyperlipidemia   . Hypertension   . Joint pain   . Knee pain, bilateral   . Sebaceous cyst 11/10/2012  . Snores    Past Surgical History:  Procedure Laterality Date  . APPENDECTOMY    . KNEE SURGERY     right  . MASS EXCISION Bilateral 11/24/2012   Procedure: EXCISION SCALP MASS X2;  Surgeon: [name], MD;  Location: [facility];  Service: General;  Laterality: Bilateral;  . SPINE SURGERY  2004   lumb  . UPPER GASTROINTESTINAL ENDOSCOPY     Family History  Problem Relation Age of Onset  . Colon cancer Neg Hx   . Colon polyps Neg Hx    Social History   Socioeconomic History  . Marital status: Married    Spouse name: Matthew Garner  . Number of children: 3  . Years of education: Not on file  . Highest education level: Not on file  Occupational History  . Occupation: Works in Human resources officer  . Smoking status: Never Smoker  . Smokeless tobacco:  Never Used  Vaping Use  . Vaping Use: Never used  Substance and Sexual Activity  . Alcohol use: No  . Drug use: No  . Sexual activity: Not on file  Other Topics Concern  . Not on file  Social History Narrative  . Not on file   Social Determinants of Health   Financial Resource Strain:   . Difficulty of Paying Living Expenses: Not on file  Food Insecurity:   . Worried About Charity fundraiser in the Last Year: Not on file  . Ran Out of Food in the Last Year: Not on file  Transportation Needs:   . Lack of Transportation (Medical): Not on file  . Lack of Transportation (Non-Medical): Not on file  Physical Activity:   . Days of Exercise per Week: Not on file  . Minutes of Exercise per Session: Not on file  Stress:   . Feeling of Stress : Not on file  Social Connections:   . Frequency of Communication with Friends and Family: Not on file  . Frequency of Social Gatherings with Friends and Family: Not on file  . Attends Religious Services: Not on file  . Active Member of Clubs or Organizations: Not on file  . Attends Archivist Meetings: Not on file  . Marital Status: Not on  file  Intimate Partner Violence:   . Fear of Current or Ex-Partner: Not on file  . Emotionally Abused: Not on file  . Physically Abused: Not on file  . Sexually Abused: Not on file   Prior to Admission medications   Medication Sig Start Date End Date Taking? Authorizing Provider  atorvastatin (LIPITOR) 10 MG tablet Take 1 tablet (10 mg total) by mouth daily at 6 PM. 12/22/18  Yes Abby Potash, PA-C  ibuprofen (ADVIL) 600 MG tablet Take 1 tablet (600 mg total) by mouth every 6 (six) hours as needed. Patient taking differently: Take 600 mg by mouth every 6 (six) hours as needed for fever, headache or mild pain.  11/12/19  Yes Corena Herter, PA-C  losartan (COZAAR) 50 MG tablet Take 1 tablet (50 mg total) by mouth every morning. 12/22/18  Yes Abby Potash, PA-C  Menthol-Methyl Salicylate  (MUSCLE RUB) 10-15 % CREA Apply 1 application topically as needed for muscle pain.   Yes [provider]  metFORMIN (GLUCOPHAGE) 500 MG tablet Take 1 tablet (500 mg total) by mouth daily with breakfast. 12/22/18  Yes Abby Potash, PA-C  omeprazole (PRILOSEC) 40 MG capsule Take 1 capsule (40 mg total) by mouth daily. 11/19/18  Yes Abby Potash, PA-C  oxycodone (OXY-IR) 5 MG capsule Take 1 capsule (5 mg total) by mouth every 6 (six) hours as needed for up to 15 doses. Patient taking differently: Take 5 mg by mouth every 6 (six) hours as needed for pain.  11/12/19  Yes Corena Herter, PA-C  sildenafil (VIAGRA) 100 MG tablet TAKE 1/2 TABLET BY MOUTH DAILY AS NEEDED FOR ERECTILE DYSFUNCTION. Patient taking differently: Take 50 mg by mouth as needed for erectile dysfunction.  01/13/19  Yes Abby Potash, PA-C  traMADol (ULTRAM) 50 MG tablet Take 50 mg by mouth every 6 (six) hours as needed for pain. 10/29/19  Yes [provider]  glucose blood test strip 1 each by Other route as needed for other. Use as instructed 01/13/19   Abby Potash, PA-C   Physical Exam Constitutional:      General: He is in acute distress.     Appearance: Normal appearance.  HENT:     Head: Normocephalic and atraumatic.     Right Ear: Tympanic membrane normal.     Left Ear: Tympanic membrane normal.     Nose: Nose normal.     Mouth/Throat:     Mouth: Mucous membranes are moist.     Pharynx: Oropharynx is clear.  Eyes:     Extraocular Movements: Extraocular movements intact.  Cardiovascular:     Rate and Rhythm: Normal rate and regular rhythm.     Pulses: Normal pulses.  Pulmonary:     Effort: Pulmonary effort is normal.  Abdominal:     General: Abdomen is flat.     Palpations: Abdomen is soft.  Musculoskeletal:        General: Normal range of motion.     Cervical back: Normal range of motion.  Skin:    General: Skin is warm and dry.  Neurological:     General: No focal deficit  present.     Mental Status: He is alert and oriented to person, place, and time.     Cranial Nerves: No cranial nerve deficit.     Sensory: Sensation is intact.     Motor: No weakness.     Coordination: Coordination is intact.     Deep Tendon Reflexes: Babinski sign absent on the right  side. Babinski sign absent on the left side.     Comments: Gait not assessed.  Has normal movement on manual exam. Strength limited by effortpatient states any movement of lower extremity causes pain. 5/5 upper extremities Normal muscle tone and bulk.    Assessment/Plan No urgent or emergent operative indications. Pain should be controlled and his care returned to his physician. He has scheduled a visit for an orthopaedic surgeon. Call if questions.

## 2019-11-13 NOTE — Progress Notes (Signed)
Patient ID: Matthew Garner, male   DOB: [birth year], 59 y.o.   MRN: [id number] Films reviewed. No acute indications in his history, physical exam,  or his MRI. No urgent or emergent operative indications at this time.  Will see later today. Notes indicate he has a treating physician for his pain. Recommend that his treating physician be notified for treatment recommendations in regards to her/his patient.

## 2019-11-13 NOTE — ED Notes (Signed)
Hourly rounding complete. Food and beverage provided. Pt comfortably eating with bed in lowest position and side rails up x2. Vitals updated. All needs met at this time. Will continue to monitor.

## 2019-11-13 NOTE — ED Notes (Signed)
Pt is bradycardic at 49 during initial nursing assessment. Provider made aware.

## 2019-11-13 NOTE — ED Notes (Signed)
Pt transported to MRI 

## 2019-11-14 DIAGNOSIS — M5459 Other low back pain: Secondary | ICD-10-CM

## 2019-11-14 LAB — CBC
HCT: 48 % (ref 39.0–52.0)
Hemoglobin: 15.8 g/dL (ref 13.0–17.0)
MCH: 28.8 pg (ref 26.0–34.0)
MCHC: 32.9 g/dL (ref 30.0–36.0)
MCV: 87.6 fL (ref 80.0–100.0)
Platelets: 311 K/uL (ref 150–400)
RBC: 5.48 MIL/uL (ref 4.22–5.81)
RDW: 12.5 % (ref 11.5–15.5)
WBC: 4.5 K/uL (ref 4.0–10.5)
nRBC: 0 % (ref 0.0–0.2)

## 2019-11-14 LAB — BASIC METABOLIC PANEL
Anion gap: 9 (ref 5–15)
BUN: 18 mg/dL (ref 6–20)
CO2: 23 mmol/L (ref 22–32)
Calcium: 9.3 mg/dL (ref 8.9–10.3)
Chloride: 103 mmol/L (ref 98–111)
Creatinine, Ser: 0.7 mg/dL (ref 0.61–1.24)
GFR, Estimated: >60 mL/min (ref >60)
Glucose, Bld: 189 mg/dL — ABNORMAL HIGH (ref 70–99)
Potassium: 4.3 mmol/L (ref 3.5–5.1)
Sodium: 135 mmol/L (ref 135–145)

## 2019-11-14 LAB — GLUCOSE, CAPILLARY: Glucose-Capillary: 138 mg/dL — ABNORMAL HIGH (ref 70–99)

## 2019-11-14 LAB — HIV ANTIBODY (ROUTINE TESTING W REFLEX): HIV Screen 4th Generation wRfx: NONREACTIVE

## 2019-11-14 MED ORDER — OXYCODONE HCL 5 MG PO TABS
5.0000 mg | ORAL_TABLET | Freq: Once | ORAL | Status: AC
  Administered 2019-11-14: 5 mg via ORAL
  Filled 2019-11-14: qty 1

## 2019-11-14 MED ORDER — PREDNISONE 20 MG PO TABS: 20.0000 mg | ORAL_TABLET | Freq: Every day | ORAL | 0 refills | Status: AC

## 2019-11-14 NOTE — Plan of Care (Signed)
Discharge instructions were given to the Pt. All questions were answered.

## 2019-11-14 NOTE — Progress Notes (Signed)
Handbook has been given to patient.  

## 2019-11-14 NOTE — Discharge Summary (Signed)
Physician Discharge Summary  Matthew Garner GEX:[id number] DOB: 06-07-1960 DOA: 11/13/2019  PCP: System, Provider Not In  Admit date: 11/13/2019 Discharge date: 11/14/2019  Admitted From: Home Disposition: Home  Recommendations for Outpatient Follow-up:  1. Follow-up with your orthopedic surgeon as a scheduled next week  Home Health: Not applicable Equipment/Devices: Not applicable  Discharge Condition: Stable CODE STATUS: Full code Diet recommendation: Low-carb diet  Discharge summary: 59 year old gentleman with history of type 2 diabetes and hypertension, chronic spinal stenosis with previous noninvasive interventions and symptom-free for last 3 years presented to the ER with 3 days of severe back pain, paresthesia and shooting pain on the left lateral leg.  He was taking some pain medicine at home with no help.  In the emergency room hemodynamically stable.  Severe pain needing multiple opiate injections so admitted to the hospital.  MRI of the lumbar spine and sacral spine was done that shows chronic spinal stenosis with no acute cord compression.  Patient has no evidence of cauda equina.  After overnight observation patient has some clinical improvement.  He will be discharged home with NSAIDs, already has tramadol and oxycodone at home.  We will also prescribe a short course of prednisone.  Patient currently stable with tolerable pain.  Continue mobility.  Patient is scheduled to be seen by orthopedic surgery on 11/22 that he will keep up.  May benefit with steroid injections.  Continue Metformin.  Advised to monitor his blood sugars while on steroids, however currently no indication to increase the therapy.  Seen by neurosurgery in the hospital, recommended no emergent surgical intervention.  Discharge Diagnoses:  Principal Problem:   Intractable low back pain Active Problems:   Hypertension   Spinal stenosis of lumbar region   Type 2 diabetes mellitus without complication, without  long-term current use of insulin (HCC)   Paresthesia of left leg    Discharge Instructions  Discharge Instructions    Diet - low sodium heart healthy   Complete by: As directed    Diet Carb Modified   Complete by: As directed    Increase activity slowly   Complete by: As directed      Allergies as of 11/14/2019   No Known Allergies     Medication List    TAKE these medications   atorvastatin 10 MG tablet Commonly known as: LIPITOR Take 1 tablet (10 mg total) by mouth daily at 6 PM.   glucose blood test strip 1 each by Other route as needed for other. Use as instructed   ibuprofen 600 MG tablet Commonly known as: ADVIL Take 1 tablet (600 mg total) by mouth every 6 (six) hours as needed. What changed: reasons to take this   losartan 50 MG tablet Commonly known as: COZAAR Take 1 tablet (50 mg total) by mouth every morning.   metFORMIN 500 MG tablet Commonly known as: GLUCOPHAGE Take 1 tablet (500 mg total) by mouth daily with breakfast.   Muscle Rub 10-15 % Crea Apply 1 application topically as needed for muscle pain.   omeprazole 40 MG capsule Commonly known as: PRILOSEC Take 1 capsule (40 mg total) by mouth daily.   oxycodone 5 MG capsule Commonly known as: OXY-IR Take 1 capsule (5 mg total) by mouth every 6 (six) hours as needed for up to 15 doses. What changed: reasons to take this   predniSONE 20 MG tablet Commonly known as: DELTASONE Take 1 tablet (20 mg total) by mouth daily for 7 days.   sildenafil 100 MG  tablet Commonly known as: VIAGRA TAKE 1/2 TABLET BY MOUTH DAILY AS NEEDED FOR ERECTILE DYSFUNCTION. What changed:   how much to take  how to take this  when to take this  reasons to take this  additional instructions   traMADol 50 MG tablet Commonly known as: ULTRAM Take 50 mg by mouth every 6 (six) hours as needed for pain.       No Known Allergies  Consultations:  Neurosurgery   Procedures/Studies: DG Lumbar Spine  Complete  Result Date: 11/13/2019 CLINICAL DATA:  Left-sided sciatica type pain EXAM: LUMBAR SPINE - COMPLETE 4+ VIEW COMPARISON:  December 20, 2018 FINDINGS: Frontal, lateral, spot lumbosacral lateral, and bilateral oblique views were obtained. There are 5 non-rib-bearing lumbar type vertebral bodies. There is no fracture or spondylolisthesis. There is moderate disc space narrowing at L1-2, L4-5, L5-S1, stable. There is facet osteoarthritic change at L4-5 and L3-4 bilaterally and at L5-S1 on the right. IMPRESSION: Osteoarthritic change at several levels, similar to prior study. No fracture or spondylolisthesis. Electronically Signed   By: Lowella Grip III M.D.   On: 11/13/2019 07:55   MR LUMBAR SPINE WO CONTRAST  Result Date: 11/13/2019 CLINICAL DATA:  Low back pain with left-sided radiculopathy EXAM: MRI LUMBAR SPINE WITHOUT CONTRAST MRI SACRUM WITHOUT CONTRAST TECHNIQUE: Multiplanar, multisequence MR imaging of the lumbar spine and sacrum was performed. No intravenous contrast was administered. COMPARISON:  MRI 05/09/2014, CT 07/27/2019 FINDINGS: LUMBAR SPINE: Technical note: Examination is mildly degraded by patient motion artifact. Segmentation: Standard. Alignment:  Physiologic. Vertebrae:  No fracture, evidence of discitis, or bone lesion. Conus medullaris and cauda equina: Conus extends to the L1 level. Conus and cauda equina appear normal. Paraspinal and other soft tissues: Negative. Disc levels: T12-L1: Sagittal sequences only. No evidence of foraminal or canal stenosis. L1-L2: Mild diffuse disc bulge without foraminal or canal stenosis. Unchanged. L2-L3: Mild diffuse disc bulge without foraminal or canal stenosis. Unchanged. L3-L4: Circumferential disc bulge. Slight worsening of bilateral facet arthropathy. Ligamentum flavum buckling. There is moderate canal stenosis and mild left greater than right foraminal stenosis. Findings slightly progressed from prior. L4-L5: Mild diffuse disc bulge  with bilateral facet arthrosis. Soft tissue edema adjacent to the bilateral facet joints, right worse than left. No significant canal stenosis. Mild bilateral foraminal stenosis, right worse than left. Unchanged. L5-S1: Mild circumferential disc bulge with suspected progression of a left foraminal protrusion. Bilateral facet arthropathy, also appears slightly progressed. There is no canal stenosis. There is mild right and moderate to severe left foraminal stenosis. Overall, findings have slightly progressed from prior. SACRUM: Bones/Joint/Cartilage No acute fracture. Bilateral sacroiliac joints are intact without diastasis. No significant SI joint arthropathy. No edema or erosions to suggest sacroiliitis. No SI joint effusion. Ligaments Intact. Muscles and Tendons The visualized bilateral hamstring tendon origins demonstrate mild tendinosis. Normal muscle bulk and signal intensity without edema, atrophy, or fatty infiltration. Soft tissues No soft tissue ulceration. No significant soft tissue edema. No fluid collection. IMPRESSION: 1. Multilevel degenerative changes of the lumbar spine with moderate canal stenosis at L3-L4, slightly progressed from prior. 2. Moderate-to-severe left foraminal stenosis at L5-S1. This finding may contribute to patient's left-sided radicular symptoms. 3. Soft tissue edema adjacent to the bilateral facet joints at L4-L5, which can be a source of back pain. 4. Unremarkable MRI of the sacrum. No evidence of sacroiliitis. Electronically Signed   By: Davina Poke D.O.   On: 11/13/2019 15:31   MR SACRUM SI JOINTS WO CONTRAST  Result Date: 11/13/2019  CLINICAL DATA:  Low back pain with left-sided radiculopathy EXAM: MRI LUMBAR SPINE WITHOUT CONTRAST MRI SACRUM WITHOUT CONTRAST TECHNIQUE: Multiplanar, multisequence MR imaging of the lumbar spine and sacrum was performed. No intravenous contrast was administered. COMPARISON:  MRI 05/09/2014, CT 07/27/2019 FINDINGS: LUMBAR SPINE:  Technical note: Examination is mildly degraded by patient motion artifact. Segmentation: Standard. Alignment:  Physiologic. Vertebrae:  No fracture, evidence of discitis, or bone lesion. Conus medullaris and cauda equina: Conus extends to the L1 level. Conus and cauda equina appear normal. Paraspinal and other soft tissues: Negative. Disc levels: T12-L1: Sagittal sequences only. No evidence of foraminal or canal stenosis. L1-L2: Mild diffuse disc bulge without foraminal or canal stenosis. Unchanged. L2-L3: Mild diffuse disc bulge without foraminal or canal stenosis. Unchanged. L3-L4: Circumferential disc bulge. Slight worsening of bilateral facet arthropathy. Ligamentum flavum buckling. There is moderate canal stenosis and mild left greater than right foraminal stenosis. Findings slightly progressed from prior. L4-L5: Mild diffuse disc bulge with bilateral facet arthrosis. Soft tissue edema adjacent to the bilateral facet joints, right worse than left. No significant canal stenosis. Mild bilateral foraminal stenosis, right worse than left. Unchanged. L5-S1: Mild circumferential disc bulge with suspected progression of a left foraminal protrusion. Bilateral facet arthropathy, also appears slightly progressed. There is no canal stenosis. There is mild right and moderate to severe left foraminal stenosis. Overall, findings have slightly progressed from prior. SACRUM: Bones/Joint/Cartilage No acute fracture. Bilateral sacroiliac joints are intact without diastasis. No significant SI joint arthropathy. No edema or erosions to suggest sacroiliitis. No SI joint effusion. Ligaments Intact. Muscles and Tendons The visualized bilateral hamstring tendon origins demonstrate mild tendinosis. Normal muscle bulk and signal intensity without edema, atrophy, or fatty infiltration. Soft tissues No soft tissue ulceration. No significant soft tissue edema. No fluid collection. IMPRESSION: 1. Multilevel degenerative changes of the  lumbar spine with moderate canal stenosis at L3-L4, slightly progressed from prior. 2. Moderate-to-severe left foraminal stenosis at L5-S1. This finding may contribute to patient's left-sided radicular symptoms. 3. Soft tissue edema adjacent to the bilateral facet joints at L4-L5, which can be a source of back pain. 4. Unremarkable MRI of the sacrum. No evidence of sacroiliitis. Electronically Signed   By: Davina Poke D.O.   On: 11/13/2019 15:31   (Echo, Carotid, EGD, Colonoscopy, ERCP)    Subjective: Patient seen and examined.  Overnight he is back pain improved but he still has pain on his left lateral aspect of the leg.  He thinks he can manage pain at home.   Discharge Exam: Vitals:   11/14/19 0433 11/14/19 0817  BP: 135/76 133/72  Pulse: (!) 57 (!) 59  Resp: 17 (!) 21  Temp: 98 F (36.7 C) 98.1 F (36.7 C)  SpO2: 96% 100%   Vitals:   11/13/19 2034 11/14/19 0032 11/14/19 0433 11/14/19 0817  BP: 130/64 121/72 135/76 133/72  Pulse: 60 65 (!) 57 (!) 59  Resp: (!) 22 20 17  (!) 21  Temp: 98.3 F (36.8 C) 97.7 F (36.5 C) 98 F (36.7 C) 98.1 F (36.7 C)  TempSrc: Oral Oral Oral Oral  SpO2: 100% 97% 96% 100%  Weight:      Height:        General: Pt is alert, awake, not in acute distress Cardiovascular: RRR, S1/S2 +, no rubs, no gallops Respiratory: CTA bilaterally, no wheezing, no rhonchi Abdominal: Soft, NT, ND, bowel sounds + Extremities: no edema, no cyanosis No neurological deficits.    The results of significant diagnostics from this  hospitalization (including imaging, microbiology, ancillary and laboratory) are listed below for reference.     Microbiology: Recent Results (from the past 240 hour(s))  Respiratory Panel by RT PCR (Flu A&B, Covid) - Nasopharyngeal Swab     Status: None   Collection Time: 11/13/19  5:08 PM   Specimen: Nasopharyngeal Swab  Result Value Ref Range Status   SARS Coronavirus 2 by RT PCR NEGATIVE NEGATIVE Final    Comment:  (NOTE) SARS-CoV-2 target nucleic acids are NOT DETECTED.  The SARS-CoV-2 RNA is generally detectable in upper respiratoy specimens during the acute phase of infection. The lowest concentration of SARS-CoV-2 viral copies this assay can detect is 131 copies/mL. A negative result does not preclude SARS-Cov-2 infection and should not be used as the sole basis for treatment or other patient management decisions. A negative result may occur with  improper specimen collection/handling, submission of specimen other than nasopharyngeal swab, presence of viral mutation(s) within the areas targeted by this assay, and inadequate number of viral copies (<131 copies/mL). A negative result must be combined with clinical observations, patient history, and epidemiological information. The expected result is Negative.  Fact Sheet for Patients:  PinkCheek.be  Fact Sheet for Healthcare Providers:  GravelBags.it  This test is no t yet approved or cleared by the [place] FDA and  has been authorized for detection and/or diagnosis of SARS-CoV-2 by FDA under an Emergency Use Authorization (EUA). This EUA will remain  in effect (meaning this test can be used) for the duration of the COVID-19 declaration under Section 564(b)(1) of the Act, 21 U.S.C. section 360bbb-3(b)(1), unless the authorization is terminated or revoked sooner.     Influenza A by PCR NEGATIVE NEGATIVE Final   Influenza B by PCR NEGATIVE NEGATIVE Final    Comment: (NOTE) The Xpert Xpress SARS-CoV-2/FLU/RSV assay is intended as an aid in  the diagnosis of influenza from Nasopharyngeal swab specimens and  should not be used as a sole basis for treatment. Nasal washings and  aspirates are unacceptable for Xpert Xpress SARS-CoV-2/FLU/RSV  testing.  Fact Sheet for Patients: PinkCheek.be  Fact Sheet for Healthcare  Providers: GravelBags.it  This test is not yet approved or cleared by the [place] FDA and  has been authorized for detection and/or diagnosis of SARS-CoV-2 by  FDA under an Emergency Use Authorization (EUA). This EUA will remain  in effect (meaning this test can be used) for the duration of the  Covid-19 declaration under Section 564(b)(1) of the Act, 21  U.S.C. section 360bbb-3(b)(1), unless the authorization is  terminated or revoked. Performed at [facility], [place] [address], [place], [place] [address]      Labs: BNP (last 3 results) No results for input(s): BNP in the last 8760 hours. Basic Metabolic Panel: Recent Labs  Lab 11/13/19 1702 11/14/19 0357  NA 137 135  K 3.9 4.3  CL 102 103  CO2 25 23  GLUCOSE 136* 189*  BUN 14 18  CREATININE 0.72 0.70  CALCIUM 8.9 9.3   Liver Function Tests: No results for input(s): AST, ALT, ALKPHOS, BILITOT, PROT, ALBUMIN in the last 168 hours. No results for input(s): LIPASE, AMYLASE in the last 168 hours. No results for input(s): AMMONIA in the last 168 hours. CBC: Recent Labs  Lab 11/13/19 1702 11/14/19 0357  WBC 5.8 4.5  NEUTROABS 4.0  --   HGB 15.3 15.8  HCT 44.2 48.0  MCV 87.0 87.6  PLT 287 311   Cardiac Enzymes: No results for input(s):  CKTOTAL, CKMB, CKMBINDEX, TROPONINI in the last 168 hours. BNP: Invalid input(s): POCBNP CBG: Recent Labs  Lab 11/13/19 1653 11/13/19 2147 11/14/19 0724  GLUCAP 131* 155* 138*   D-Dimer No results for input(s): DDIMER in the last 72 hours. Hgb A1c No results for input(s): HGBA1C in the last 72 hours. Lipid Profile No results for input(s): CHOL, HDL, LDLCALC, TRIG, CHOLHDL, LDLDIRECT in the last 72 hours. Thyroid function studies No results for input(s): TSH, T4TOTAL, T3FREE, THYROIDAB in the last 72 hours.  Invalid input(s): FREET3 Anemia work up No results for input(s): VITAMINB12, FOLATE, FERRITIN, TIBC, IRON,  RETICCTPCT in the last 72 hours. Urinalysis    Component Value Date/Time   COLORURINE YELLOW 07/27/2019 1742   APPEARANCEUR CLEAR 07/27/2019 1742   LABSPEC 1.024 07/27/2019 1742   PHURINE 5.0 07/27/2019 1742   GLUCOSEU 50 (A) 07/27/2019 1742   HGBUR NEGATIVE 07/27/2019 1742   BILIRUBINUR NEGATIVE 07/27/2019 1742   BILIRUBINUR neg 12/08/2012 1112   KETONESUR 5 (A) 07/27/2019 1742   PROTEINUR NEGATIVE 07/27/2019 1742   UROBILINOGEN 0.2 12/12/2012 1446   NITRITE NEGATIVE 07/27/2019 1742   LEUKOCYTESUR NEGATIVE 07/27/2019 1742   Sepsis Labs Invalid input(s): PROCALCITONIN,  WBC,  LACTICIDVEN Microbiology Recent Results (from the past 240 hour(s))  Respiratory Panel by RT PCR (Flu A&B, Covid) - Nasopharyngeal Swab     Status: None   Collection Time: 11/13/19  5:08 PM   Specimen: Nasopharyngeal Swab  Result Value Ref Range Status   SARS Coronavirus 2 by RT PCR NEGATIVE NEGATIVE Final    Comment: (NOTE) SARS-CoV-2 target nucleic acids are NOT DETECTED.  The SARS-CoV-2 RNA is generally detectable in upper respiratoy specimens during the acute phase of infection. The lowest concentration of SARS-CoV-2 viral copies this assay can detect is 131 copies/mL. A negative result does not preclude SARS-Cov-2 infection and should not be used as the sole basis for treatment or other patient management decisions. A negative result may occur with  improper specimen collection/handling, submission of specimen other than nasopharyngeal swab, presence of viral mutation(s) within the areas targeted by this assay, and inadequate number of viral copies (<131 copies/mL). A negative result must be combined with clinical observations, patient history, and epidemiological information. The expected result is Negative.  Fact Sheet for Patients:  PinkCheek.be  Fact Sheet for Healthcare Providers:  GravelBags.it  This test is no t yet approved or  cleared by the [place] FDA and  has been authorized for detection and/or diagnosis of SARS-CoV-2 by FDA under an Emergency Use Authorization (EUA). This EUA will remain  in effect (meaning this test can be used) for the duration of the COVID-19 declaration under Section 564(b)(1) of the Act, 21 U.S.C. section 360bbb-3(b)(1), unless the authorization is terminated or revoked sooner.     Influenza A by PCR NEGATIVE NEGATIVE Final   Influenza B by PCR NEGATIVE NEGATIVE Final    Comment: (NOTE) The Xpert Xpress SARS-CoV-2/FLU/RSV assay is intended as an aid in  the diagnosis of influenza from Nasopharyngeal swab specimens and  should not be used as a sole basis for treatment. Nasal washings and  aspirates are unacceptable for Xpert Xpress SARS-CoV-2/FLU/RSV  testing.  Fact Sheet for Patients: PinkCheek.be  Fact Sheet for Healthcare Providers: GravelBags.it  This test is not yet approved or cleared by the [place] FDA and  has been authorized for detection and/or diagnosis of SARS-CoV-2 by  FDA under an Emergency Use Authorization (EUA). This EUA will remain  in effect (meaning this  test can be used) for the duration of the  Covid-19 declaration under Section 564(b)(1) of the Act, 21  U.S.C. section 360bbb-3(b)(1), unless the authorization is  terminated or revoked. Performed at [facility], [place] [address], [place], [place] [address]      Time coordinating discharge:  30 minutes  SIGNED:   Barb Merino, MD  [facility] 11/14/2019, 10:22 AM

## 2019-11-14 NOTE — Plan of Care (Signed)
  Problem: Education: Goal: Knowledge of General Education information will improve Description: Including pain rating scale, medication(s)/side effects and non-pharmacologic comfort measures Outcome: Progressing   Problem: Pain Managment: Goal: General experience of comfort will improve Outcome: Progressing   

## 2021-05-17 ENCOUNTER — Ambulatory Visit (INDEPENDENT_AMBULATORY_CARE_PROVIDER_SITE_OTHER): Payer: 59 | Admitting: Orthopaedic Surgery

## 2021-05-17 ENCOUNTER — Ambulatory Visit: Payer: Self-pay

## 2021-05-17 ENCOUNTER — Other Ambulatory Visit (HOSPITAL_COMMUNITY): Payer: Self-pay | Admitting: Internal Medicine

## 2021-05-17 ENCOUNTER — Ambulatory Visit (INDEPENDENT_AMBULATORY_CARE_PROVIDER_SITE_OTHER): Payer: 59

## 2021-05-17 ENCOUNTER — Other Ambulatory Visit: Payer: Self-pay | Admitting: Internal Medicine

## 2021-05-17 ENCOUNTER — Telehealth: Payer: Self-pay

## 2021-05-17 VITALS — Ht 64.0 in | Wt 260.0 lb

## 2021-05-17 DIAGNOSIS — Z87442 Personal history of urinary calculi: Secondary | ICD-10-CM

## 2021-05-17 DIAGNOSIS — M25561 Pain in right knee: Secondary | ICD-10-CM

## 2021-05-17 DIAGNOSIS — Z6841 Body Mass Index (BMI) 40.0 and over, adult: Secondary | ICD-10-CM

## 2021-05-17 DIAGNOSIS — M25562 Pain in left knee: Secondary | ICD-10-CM

## 2021-05-17 MED ORDER — TRAMADOL HCL 50 MG PO TABS: 50.0000 mg | ORAL_TABLET | Freq: Two times a day (BID) | ORAL | 0 refills | Status: AC | PRN

## 2021-05-17 NOTE — Telephone Encounter (Signed)
Pt seen in office today bilateral OA of knees would like to get [employer] approval for this pt he is a Dr. Erlinda Hong pt.  ?

## 2021-05-17 NOTE — Progress Notes (Signed)
Office Visit Note   Patient: Matthew Garner           Date of Birth: 1960-12-13           MRN: [id number] Visit Date: 05/17/2021              Requested by: No referring provider defined for this encounter. PCP: System, Provider Not In   Assessment & Plan: Visit Diagnoses:  1. Pain in both knees, unspecified chronicity     Plan: Impression is advanced degenerative joint disease both knees.  Today we had a discussion about various treatment options to include cortisone versus viscosupplementation injection as well as total knee arthroplasty.  He has had viscosupplementation injections in the past which provide 3 to 4 months relief and would like to repeat these if possible.  We have discussed that she currently has a BMI of 44.63 and will need to get to a weight of 230 pounds in order to obtain a BMI of less than 40 to proceed with total knee arthroplasty.  He understands and agrees.  Follow-up with [name] once approved for Visco injections.  The patient meets the AMA guidelines for Morbid (severe) obesity with a BMI > 40.0 and I have recommended weight loss.  This patient is diagnosed with osteoarthritis of the knee(s).    Radiographs show evidence of joint space narrowing, osteophytes, subchondral sclerosis and/or subchondral cysts.  This patient has knee pain which interferes with functional and activities of daily living.    This patient has experienced inadequate response, adverse effects and/or intolerance with conservative treatments such as acetaminophen, NSAIDS, topical creams, physical therapy or regular exercise, knee bracing and/or weight loss.   This patient has experienced inadequate response or has a contraindication to intra articular steroid injections for at least 3 months.   This patient is not scheduled to have a total knee replacement within 6 months of starting treatment with viscosupplementation.   Follow-Up Instructions: Return for once approved for visco inj.    Orders:  Orders Placed This Encounter  Procedures   XR KNEE 3 VIEW LEFT   XR KNEE 3 VIEW RIGHT   Meds ordered this encounter  Medications   traMADol (ULTRAM) 50 MG tablet    Sig: Take 1 tablet (50 mg total) by mouth every 12 (twelve) hours as needed.    Dispense:  30 tablet    Refill:  0      Procedures: No procedures performed   Clinical Data: No additional findings.   Subjective: Chief Complaint  Patient presents with   Right Knee - Pain   Left Knee - Pain    HPI patient is a pleasant 61 year old gentleman who comes in today with bilateral knee pain for the past 5 to 6 years.  History of underlying osteoarthritis.  Symptoms have progressively worsened.  He describes constant pain worse with activity as well as at work as she works in a Radio broadcast assistant heavy boxes.  He takes occasional Advil with mild relief.  He has had several series of viscosupplementation injections in the past which last for about 3 to 4 months each.  His last injections were over 6 months ago he thinks.  He notes that cortisone injections provide minimal to no relief.  Review of Systems as detailed in HPI.  All others reviewed and are negative.   Objective: Vital Signs: Ht '5\' 4"'$  (1.626 m)   Wt 260 lb (117.9 kg)   BMI 44.63 kg/m   Physical Exam well-developed  and well-nourished gentleman in no acute distress.  Alert and oriented x3.  Ortho Exam bilateral knee exam shows trace effusion.  Varus deformity.  Range of motion 0 to 95 degrees.  Medial joint line tenderness.  Marked tilt from crepitus.  Limits are stable.  Is neurovascular tact distally.  Specialty Comments:  No specialty comments available.  Imaging: XR KNEE 3 VIEW LEFT  Result Date: 05/17/2021 X-rays demonstrate advanced tricompartmental degenerative changes with varus deformity  XR KNEE 3 VIEW RIGHT  Result Date: 05/17/2021 X-rays demonstrate advanced tricompartmental degenerative changes with varus deformity     PMFS History: Patient Active Problem List   Diagnosis Date Noted   Intractable low back pain 11/13/2019   Paresthesia of left leg 11/13/2019   Diabetes mellitus type 2, controlled ([place]) 11/13/2019   Type 2 diabetes mellitus without complication, without long-term current use of insulin ([place]) 03/04/2017   Vitamin D deficiency 03/04/2017   Other fatigue 02/04/2017   Shortness of breath on exertion 02/04/2017   Knee pain, bilateral 03/29/2015   Nonspecific abnormal electrocardiogram (ECG) (EKG) 04/21/2014   Spinal stenosis of lumbar region 10/27/2013   Pain of left calf 06/16/2013   Diabetes ([place]) 11/27/2011   Hyperlipidemia 11/27/2011   Hypertension 11/27/2011   Past Medical History:  Diagnosis Date   Allergy    environmental   Anemia    Back pain    Diabetes mellitus    Environmental allergies 11/27/2011   GERD (gastroesophageal reflux disease)    Hyperlipidemia    Hypertension    Joint pain    Knee pain, bilateral    Sebaceous cyst 11/10/2012   Snores     Family History  Problem Relation Age of Onset   Colon cancer Neg Hx    Colon polyps Neg Hx     Past Surgical History:  Procedure Laterality Date   APPENDECTOMY     KNEE SURGERY     right   MASS EXCISION Bilateral 11/24/2012   Procedure: EXCISION SCALP MASS X2;  Surgeon: [name], MD;  Location: [facility];  Service: General;  Laterality: Bilateral;   SPINE SURGERY  2004   lumb   UPPER GASTROINTESTINAL ENDOSCOPY     Social History   Occupational History   Occupation: Works in Scientist, research (life sciences)  Tobacco Use   Smoking status: Never   Smokeless tobacco: Never  Vaping Use   Vaping Use: Never used  Substance and Sexual Activity   Alcohol use: No   Drug use: No   Sexual activity: Not on file

## 2021-05-18 ENCOUNTER — Ambulatory Visit (HOSPITAL_COMMUNITY): Payer: 59

## 2021-05-18 ENCOUNTER — Encounter (HOSPITAL_COMMUNITY): Payer: Self-pay

## 2021-05-18 DIAGNOSIS — Z6841 Body Mass Index (BMI) 40.0 and over, adult: Secondary | ICD-10-CM | POA: Insufficient documentation

## 2021-05-18 NOTE — Telephone Encounter (Signed)
Noted  

## 2021-05-25 ENCOUNTER — Ambulatory Visit (HOSPITAL_COMMUNITY)
Admission: RE | Admit: 2021-05-25 | Discharge: 2021-05-25 | Disposition: A | Discharge status: Home / Self Care | Payer: 59 | Source: Ambulatory Visit | Attending: Internal Medicine | Admitting: Internal Medicine

## 2021-05-25 DIAGNOSIS — Z87442 Personal history of urinary calculi: Secondary | ICD-10-CM | POA: Insufficient documentation

## 2021-05-26 ENCOUNTER — Telehealth: Payer: Self-pay

## 2021-05-26 NOTE — Telephone Encounter (Signed)
VOB submitted for Monovisc, bilateral knee. BV pending 

## 2021-05-31 ENCOUNTER — Telehealth: Payer: Self-pay | Admitting: Orthopaedic Surgery

## 2021-05-31 ENCOUNTER — Telehealth: Payer: Self-pay

## 2021-05-31 NOTE — Telephone Encounter (Signed)
Patient called needing to know if the gel injections have been approved? The number to contact patient is [phone]

## 2021-05-31 NOTE — Telephone Encounter (Signed)
Called and left a VM advising patient that PA is required and has been submitted for Monovisc, bilateral knee

## 2021-05-31 NOTE — Telephone Encounter (Signed)
Faxed completed PA form to [facility] at [phone] for Monovisc, bilateral knee. PA pending

## 2021-06-02 ENCOUNTER — Other Ambulatory Visit: Payer: Self-pay

## 2021-06-02 DIAGNOSIS — M25561 Pain in right knee: Secondary | ICD-10-CM

## 2021-06-12 ENCOUNTER — Ambulatory Visit: Payer: 59 | Admitting: Physician Assistant

## 2021-06-15 ENCOUNTER — Ambulatory Visit (INDEPENDENT_AMBULATORY_CARE_PROVIDER_SITE_OTHER): Payer: 59 | Admitting: Physician Assistant

## 2021-06-15 DIAGNOSIS — M170 Bilateral primary osteoarthritis of knee: Secondary | ICD-10-CM | POA: Diagnosis not present

## 2021-06-15 MED ORDER — LIDOCAINE HCL 1 % IJ SOLN
3.0000 mL | INTRAMUSCULAR | Status: AC | PRN
  Administered 2021-06-15: 3 mL

## 2021-06-15 MED ORDER — BUPIVACAINE HCL 0.25 % IJ SOLN
0.6600 mL | INTRAMUSCULAR | Status: AC | PRN
  Administered 2021-06-15: .66 mL via INTRA_ARTICULAR

## 2021-06-15 MED ORDER — HYALURONAN 88 MG/4ML IX SOSY
88.0000 mg | PREFILLED_SYRINGE | INTRA_ARTICULAR | Status: AC | PRN
  Administered 2021-06-15: 88 mg via INTRA_ARTICULAR

## 2021-06-15 NOTE — Progress Notes (Signed)
Office Visit Note   Patient: Matthew Garner           Date of Birth: March 03, 1960           MRN: [id number] Visit Date: 06/15/2021              Requested by: Leandrew Koyanagi, MD [address] [place],  [place] [address] PCP: System, Provider Not In   Assessment & Plan: Visit Diagnoses:  1. Bilateral primary osteoarthritis of knee     Plan: Impression is bilateral knee osteoarthritis.  Today, we will proceed with bilateral knee Monovisc injections.  He tolerated these well.  He will follow-up with [name] as needed.  Follow-Up Instructions: Return if symptoms worsen or fail to improve.   Orders:  Orders Placed This Encounter  Procedures   Large Joint Inj: bilateral knee   No orders of the defined types were placed in this encounter.     Procedures: Large Joint Inj: bilateral knee on 06/15/2021 2:31 PM Indications: pain Details: 22 G needle, anterolateral approach Medications (Right): 0.66 mL bupivacaine 0.25 %; 3 mL lidocaine 1 %; 88 mg Hyaluronan 88 MG/4ML Medications (Left): 0.66 mL bupivacaine 0.25 %; 3 mL lidocaine 1 %; 88 mg Hyaluronan 88 MG/4ML      Clinical Data: No additional findings.   Subjective: Chief Complaint  Patient presents with   Right Knee - Pain   Left Knee - Pain    HPI patient is a pleasant 61 year old gentleman who comes in today for repeat bilateral knee Monovisc injections.  History of underlying osteoarthritis.  He has had Visco injections in the past which provide approximately 2 to 3 months relief.     Objective: Vital Signs: There were no vitals taken for this visit.    Ortho Exam unchanged bilateral knee exam  Specialty Comments:  No specialty comments available.  Imaging: No new imaging   PMFS History: Patient Active Problem List   Diagnosis Date Noted   Body mass index 40.0-44.9, adult ([place]) 05/18/2021   Morbid obesity ([place]) 05/18/2021   Intractable low back pain 11/13/2019   Paresthesia of left leg 11/13/2019    Diabetes mellitus type 2, controlled ([place]) 11/13/2019   Type 2 diabetes mellitus without complication, without long-term current use of insulin ([place]) 03/04/2017   Vitamin D deficiency 03/04/2017   Other fatigue 02/04/2017   Shortness of breath on exertion 02/04/2017   Knee pain, bilateral 03/29/2015   Nonspecific abnormal electrocardiogram (ECG) (EKG) 04/21/2014   Spinal stenosis of lumbar region 10/27/2013   Pain of left calf 06/16/2013   Diabetes ([place]) 11/27/2011   Hyperlipidemia 11/27/2011   Hypertension 11/27/2011   Past Medical History:  Diagnosis Date   Allergy    environmental   Anemia    Back pain    Diabetes mellitus    Environmental allergies 11/27/2011   GERD (gastroesophageal reflux disease)    Hyperlipidemia    Hypertension    Joint pain    Knee pain, bilateral    Sebaceous cyst 11/10/2012   Snores     Family History  Problem Relation Age of Onset   Colon cancer Neg Hx    Colon polyps Neg Hx     Past Surgical History:  Procedure Laterality Date   APPENDECTOMY     KNEE SURGERY     right   MASS EXCISION Bilateral 11/24/2012   Procedure: EXCISION SCALP MASS X2;  Surgeon: [name], MD;  Location: [facility];  Service: General;  Laterality:  Bilateral;   SPINE SURGERY  2004   lumb   UPPER GASTROINTESTINAL ENDOSCOPY     Social History   Occupational History   Occupation: Works in Scientist, research (life sciences)  Tobacco Use   Smoking status: Never   Smokeless tobacco: Never  Vaping Use   Vaping Use: Never used  Substance and Sexual Activity   Alcohol use: No   Drug use: No   Sexual activity: Not on file

## 2021-08-09 ENCOUNTER — Encounter (INDEPENDENT_AMBULATORY_CARE_PROVIDER_SITE_OTHER): Payer: Self-pay

## 2021-11-15 IMAGING — CR DG LUMBAR SPINE COMPLETE 4+V
5 series · 5 of 5 positions shown · non-contrast
Comparison: December 20, 2018

CLINICAL DATA: Left-sided sciatica type pain

EXAM:
LUMBAR SPINE - COMPLETE 4+ VIEW

[t lumbar spine ap]
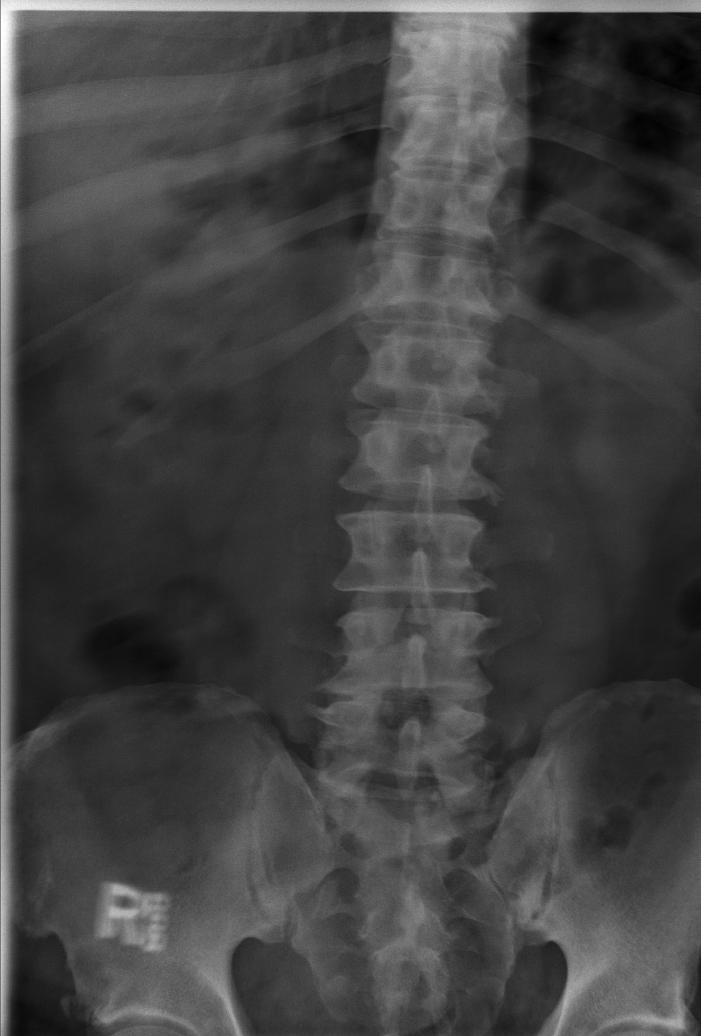

[t lumbar spine obl (1 of 2)]
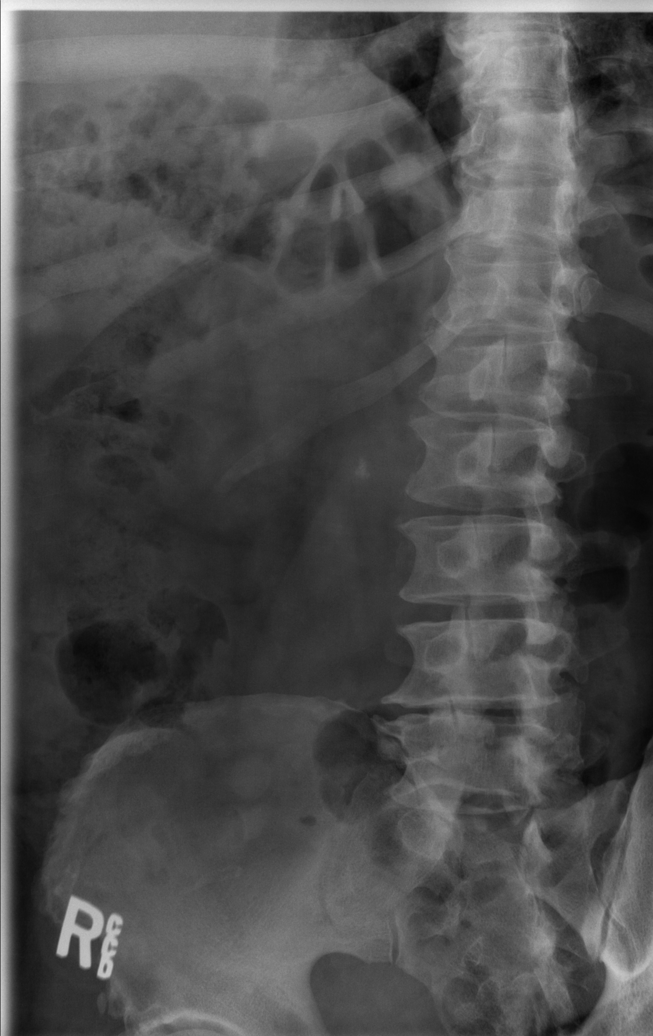

[t lumbar spine obl (2 of 2)]
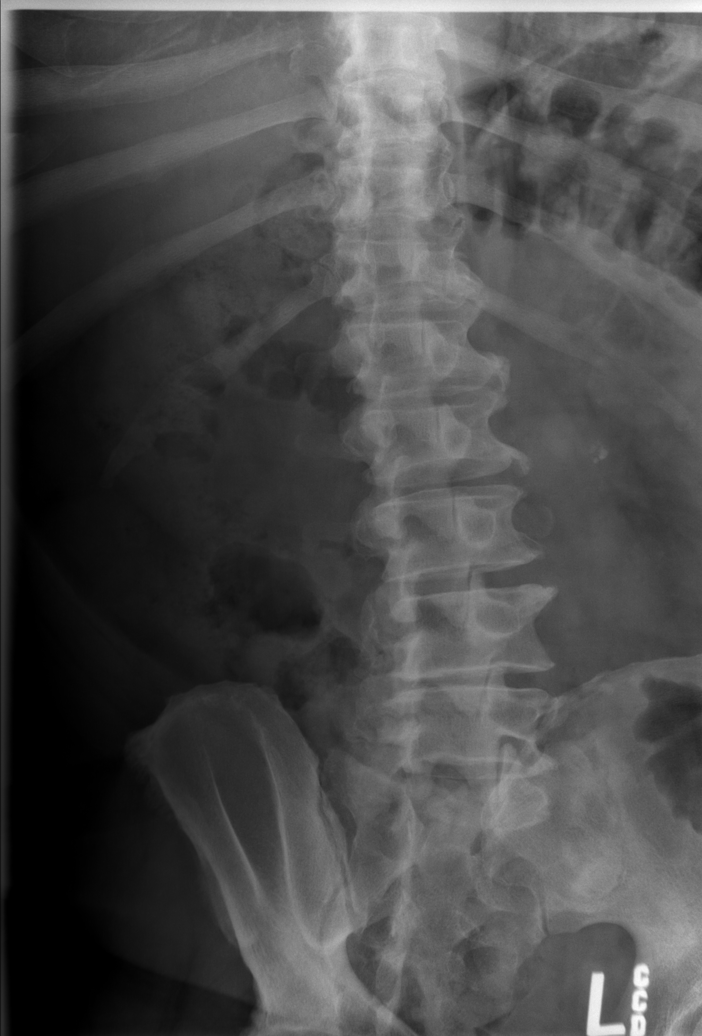

[t lumbar spine lat]
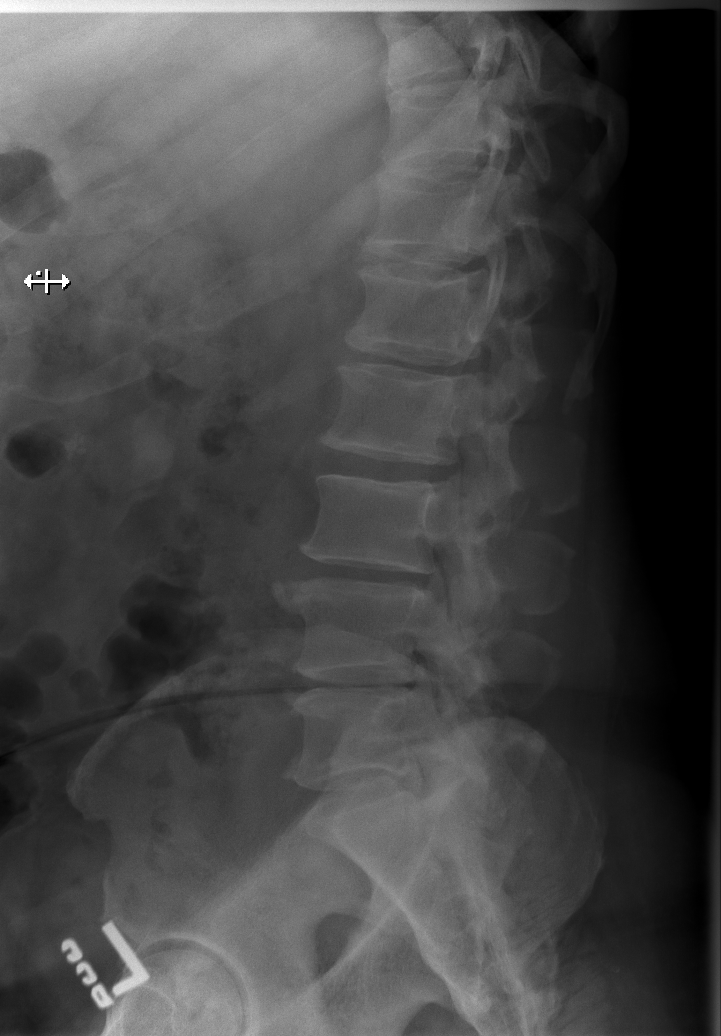

[t lumbar l-5 s-1 spot]
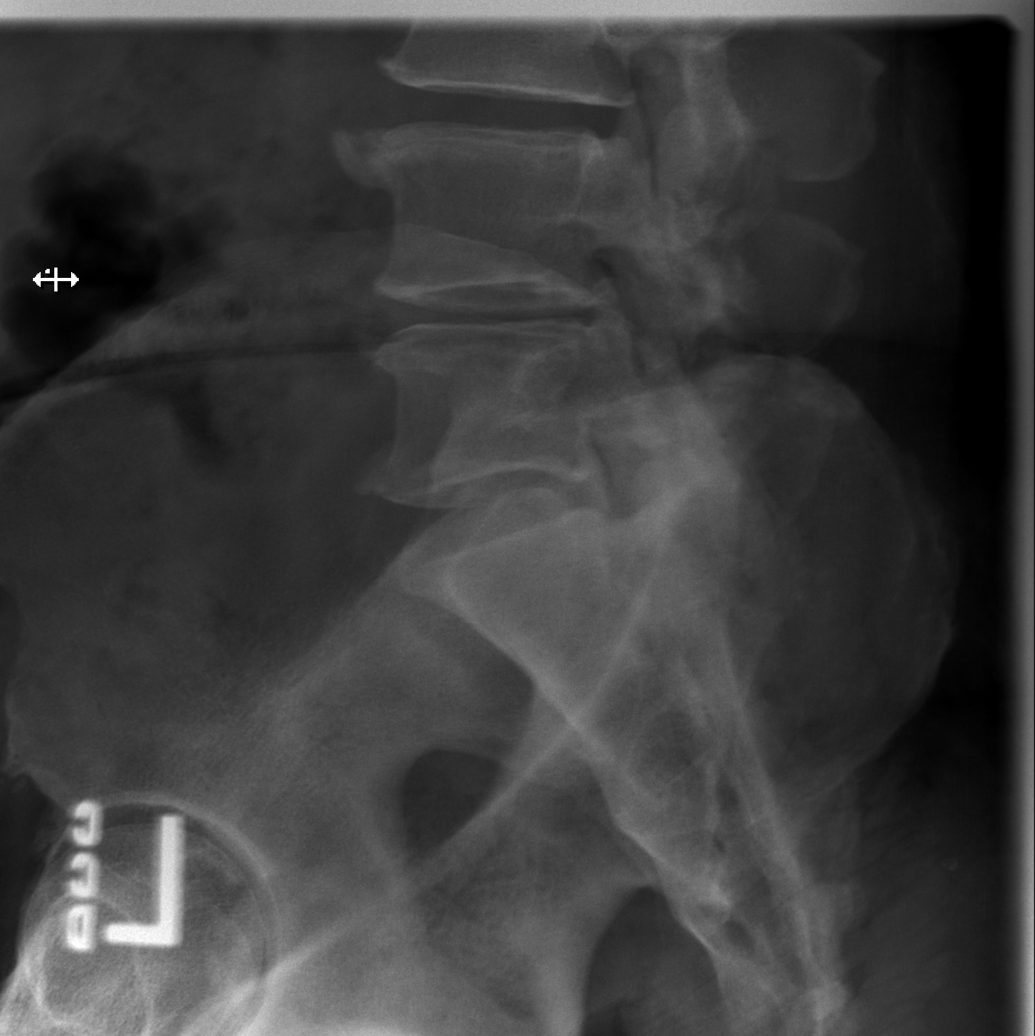

[5 of 5 positions shown; findings below may reference images not displayed]

FINDINGS: Frontal, lateral, spot lumbosacral lateral, and bilateral oblique
views were obtained. There are 5 non-rib-bearing lumbar type
vertebral bodies. There is no fracture or spondylolisthesis. There
is moderate disc space narrowing at L1-2, L4-5, L5-S1, stable. There
is facet osteoarthritic change at L4-5 and L3-4 bilaterally and at
L5-S1 on the right.
IMPRESSION: Osteoarthritic change at several levels, similar to prior study. No
fracture or spondylolisthesis.

## 2021-11-15 IMAGING — MR MR LUMBAR SPINE W/O CM
7 of 9 series · 30 of 48 positions shown · non-contrast
Comparison: MRI 05/09/2014, CT 07/27/2019

CLINICAL DATA: Low back pain with left-sided radiculopathy

EXAM:
MRI LUMBAR SPINE WITHOUT CONTRAST
MRI SACRUM WITHOUT CONTRAST
TECHNIQUE: Multiplanar, multisequence MR imaging of the lumbar spine and sacrum
was performed. No intravenous contrast was administered.

[Series 5: T1 · sagittal · 4.0mm · 0.72mm/px · 2 of 17 slices shown (1 of 4)]
[im 1/17]
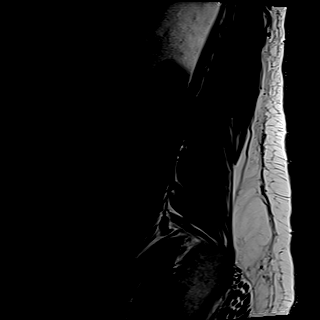
[im 17/17]
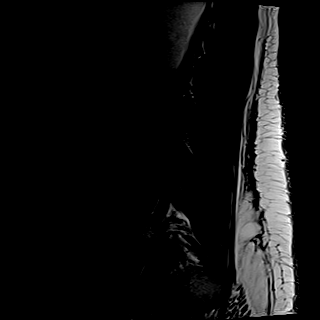

[Series 7: STIR · sagittal · 4.0mm · 0.45mm/px · 3 of 17 slices shown (1 of 2)]
[im 1/17]
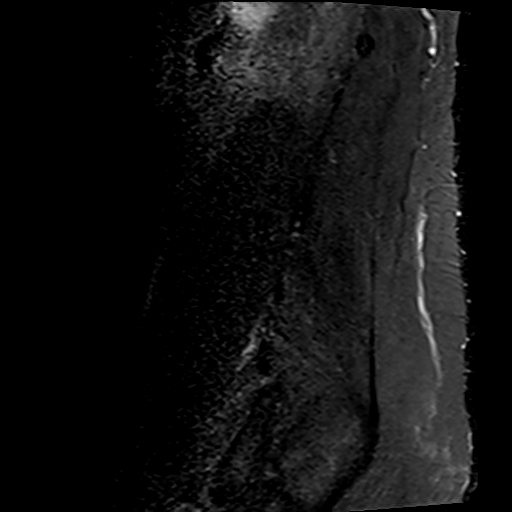
[im 9/17]
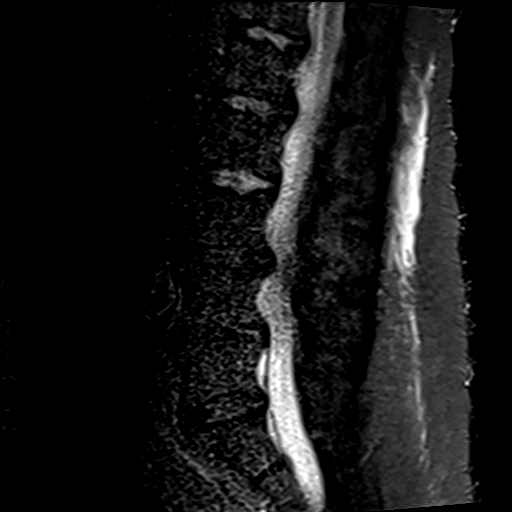
[im 17/17]
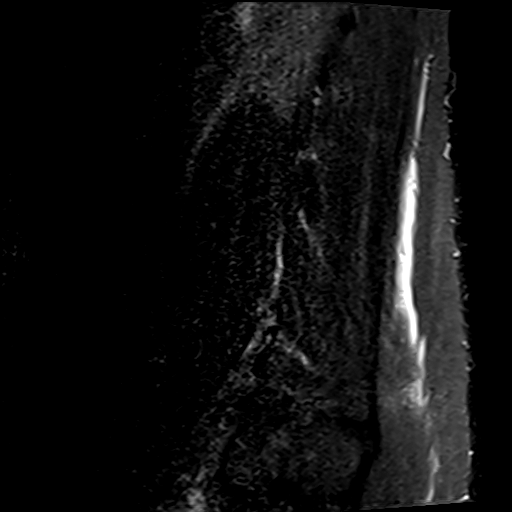

[Series 9: T1 · axial · 4.0mm · 0.39mm/px · z∈[-88,+88]mm · 6 of 34 slices shown (2 of 4)]
[im 1/34]
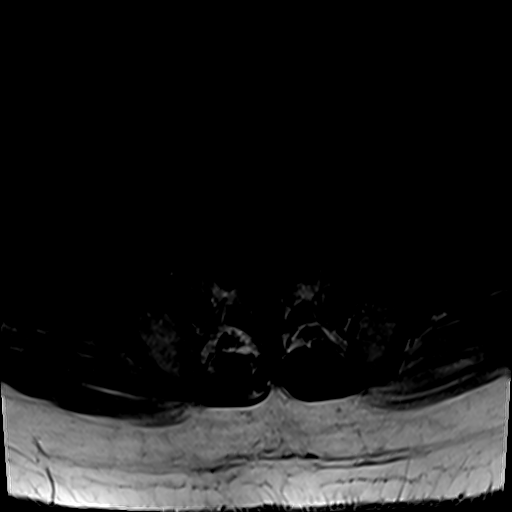
[im 7/34]
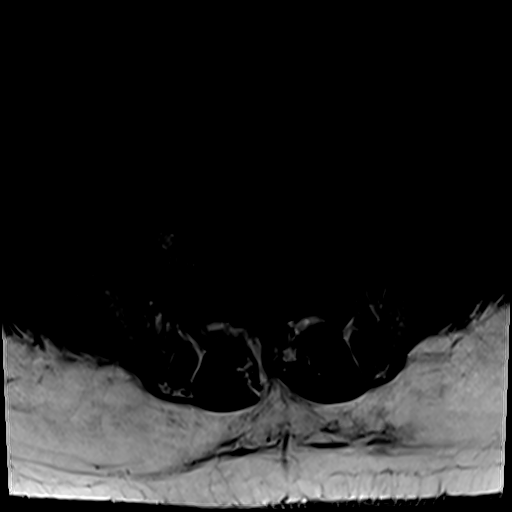
[im 14/34]
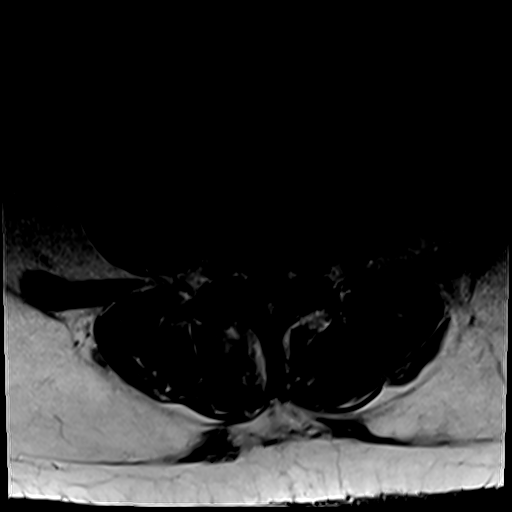
[im 20/34]
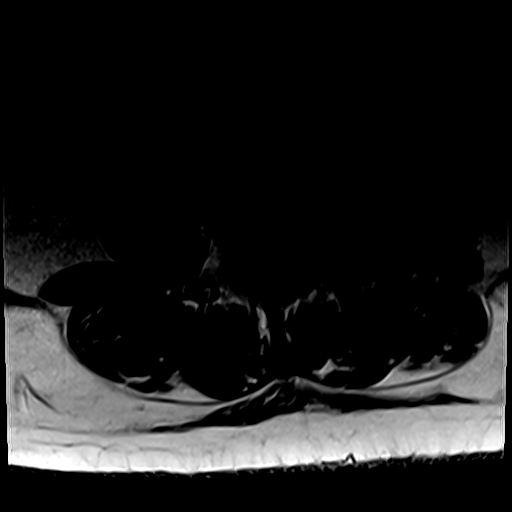
[im 27/34]
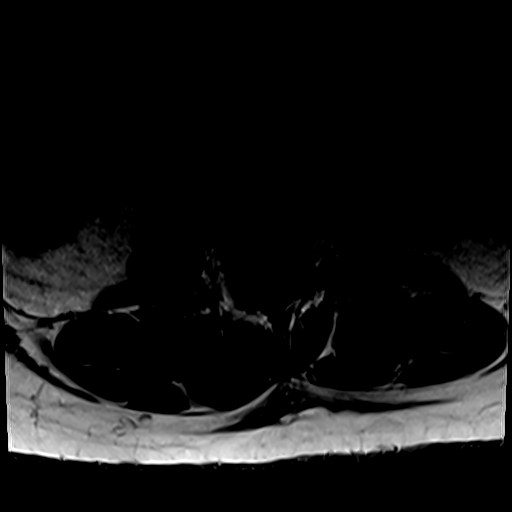
[im 34/34]
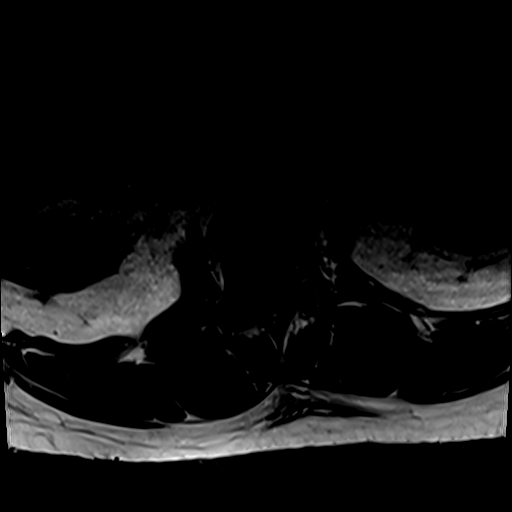

[Series 10: T2 · sagittal · 4.0mm · 0.72mm/px · 3 of 17 slices shown]
[im 1/17]
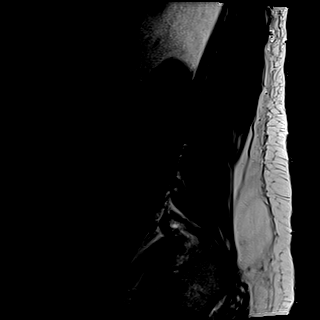
[im 9/17]
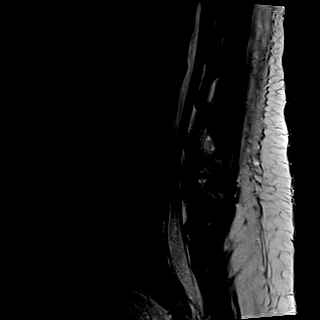
[im 17/17]
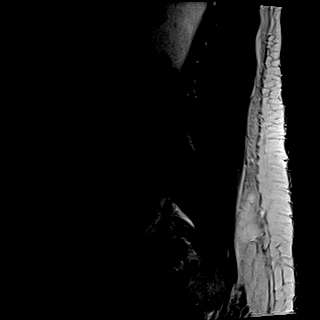

[Series 13: T1 · coronal · 3.0mm · 0.86mm/px · 7 of 38 slices shown (3 of 4)]
[im 1/38]
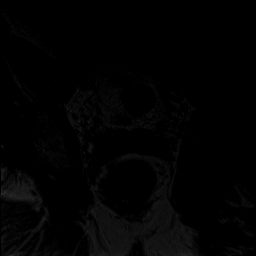
[im 7/38]
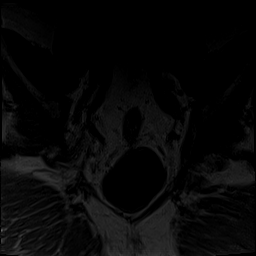
[im 13/38]
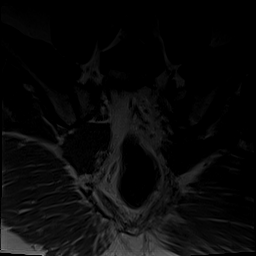
[im 19/38]
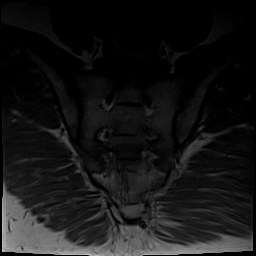
[im 25/38]
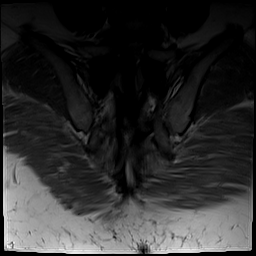
[im 31/38]
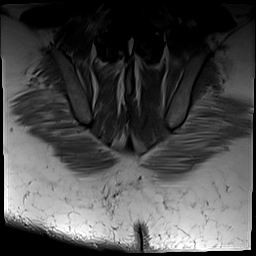
[im 38/38]
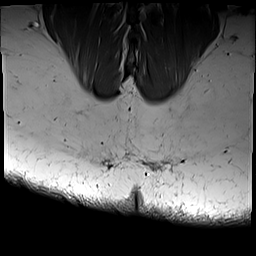

[Series 14: STIR · coronal · 3.0mm · 0.43mm/px · 2 of 38 slices shown (2 of 2)]
[im 1/38]
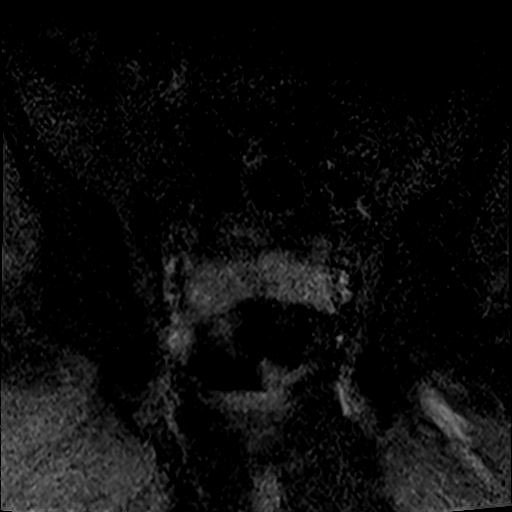
[im 7/38]
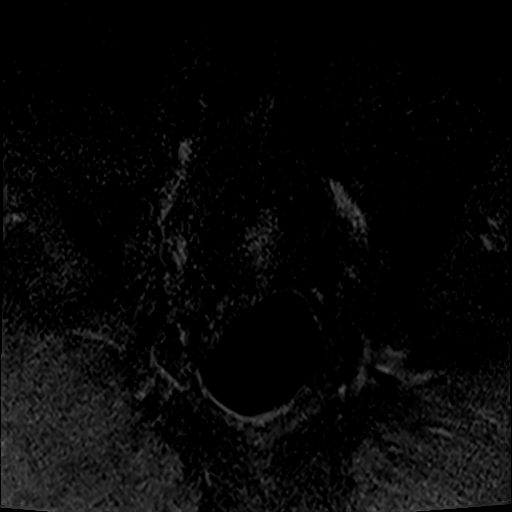

[Series 15: T1 · axial · 4.0mm · 0.98mm/px · z∈[-260,-80]mm · 7 of 41 slices shown (4 of 4)]
[im 1/41]
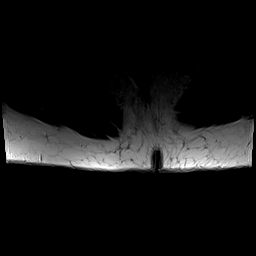
[im 7/41]
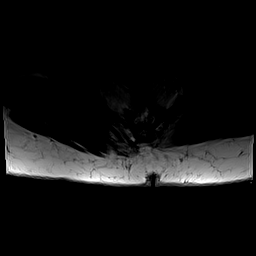
[im 14/41]
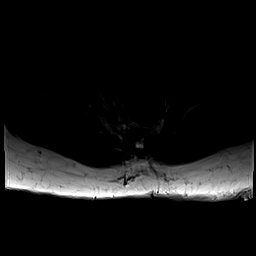
[im 21/41]
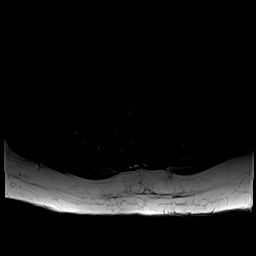
[im 27/41]
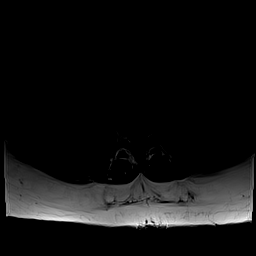
[im 34/41]
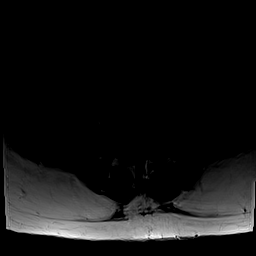
[im 41/41]
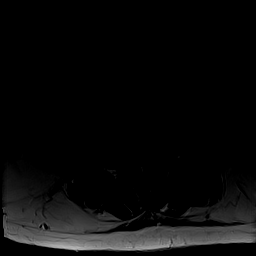

[30 of 48 positions shown; findings below may reference images not displayed]

FINDINGS: LUMBAR SPINE:

Technical note: Examination is mildly degraded by patient motion
artifact.

Segmentation: Standard.

Alignment:  Physiologic.

Vertebrae:  No fracture, evidence of discitis, or bone lesion.

Conus medullaris and cauda equina: Conus extends to the L1 level.
Conus and cauda equina appear normal.

Paraspinal and other soft tissues: Negative.

Disc levels:

T12-L1: Sagittal sequences only. No evidence of foraminal or canal
stenosis.

L1-L2: Mild diffuse disc bulge without foraminal or canal stenosis.
Unchanged.

L2-L3: Mild diffuse disc bulge without foraminal or canal stenosis.
Unchanged.

L3-L4: Circumferential disc bulge. Slight worsening of bilateral
facet arthropathy. Ligamentum flavum buckling. There is moderate
canal stenosis and mild left greater than right foraminal stenosis.
Findings slightly progressed from prior.

L4-L5: Mild diffuse disc bulge with bilateral facet arthrosis. Soft
tissue edema adjacent to the bilateral facet joints, right worse
than left. No significant canal stenosis. Mild bilateral foraminal
stenosis, right worse than left. Unchanged.

L5-S1: Mild circumferential disc bulge with suspected progression of
a left foraminal protrusion. Bilateral facet arthropathy, also
appears slightly progressed. There is no canal stenosis. There is
mild right and moderate to severe left foraminal stenosis. Overall,
findings have slightly progressed from prior.

SACRUM:

Bones/Joint/Cartilage

No acute fracture. Bilateral sacroiliac joints are intact without
diastasis. No significant SI joint arthropathy. No edema or erosions
to suggest sacroiliitis. No SI joint effusion.

Ligaments

Intact.

Muscles and Tendons

The visualized bilateral hamstring tendon origins demonstrate mild
tendinosis. Normal muscle bulk and signal intensity without edema,
atrophy, or fatty infiltration.

Soft tissues

No soft tissue ulceration. No significant soft tissue edema. No
fluid collection.
IMPRESSION: 1. Multilevel degenerative changes of the lumbar spine with moderate
canal stenosis at L3-L4, slightly progressed from prior.
2. Moderate-to-severe left foraminal stenosis at L5-S1. This finding
may contribute to patient's left-sided radicular symptoms.
3. Soft tissue edema adjacent to the bilateral facet joints at
L4-L5, which can be a source of back pain.
4. Unremarkable MRI of the sacrum. No evidence of sacroiliitis.

## 2021-12-20 ENCOUNTER — Encounter (HOSPITAL_COMMUNITY): Payer: Self-pay | Admitting: Emergency Medicine

## 2021-12-20 ENCOUNTER — Emergency Department (HOSPITAL_COMMUNITY)
Admission: EM | Admit: 2021-12-20 | Discharge: 2021-12-20 | Disposition: A | Discharge status: Home / Self Care | Payer: Commercial Managed Care - HMO | Attending: Emergency Medicine | Admitting: Emergency Medicine

## 2021-12-20 DIAGNOSIS — Z7984 Long term (current) use of oral hypoglycemic drugs: Secondary | ICD-10-CM | POA: Insufficient documentation

## 2021-12-20 DIAGNOSIS — E119 Type 2 diabetes mellitus without complications: Secondary | ICD-10-CM | POA: Diagnosis not present

## 2021-12-20 DIAGNOSIS — Z79899 Other long term (current) drug therapy: Secondary | ICD-10-CM | POA: Diagnosis not present

## 2021-12-20 DIAGNOSIS — M5442 Lumbago with sciatica, left side: Secondary | ICD-10-CM | POA: Insufficient documentation

## 2021-12-20 DIAGNOSIS — M549 Dorsalgia, unspecified: Secondary | ICD-10-CM | POA: Diagnosis present

## 2021-12-20 DIAGNOSIS — I10 Essential (primary) hypertension: Secondary | ICD-10-CM | POA: Diagnosis not present

## 2021-12-20 DIAGNOSIS — M5432 Sciatica, left side: Secondary | ICD-10-CM

## 2021-12-20 MED ORDER — CYCLOBENZAPRINE HCL 10 MG PO TABS: 10.0000 mg | ORAL_TABLET | Freq: Two times a day (BID) | ORAL | 0 refills | Status: DC | PRN

## 2021-12-20 MED ORDER — KETOROLAC TROMETHAMINE 30 MG/ML IJ SOLN
30.0000 mg | Freq: Once | INTRAMUSCULAR | Status: AC
  Administered 2021-12-20: 30 mg via INTRAMUSCULAR
  Filled 2021-12-20: qty 1

## 2021-12-20 MED ORDER — DEXAMETHASONE SODIUM PHOSPHATE 10 MG/ML IJ SOLN
10.0000 mg | Freq: Once | INTRAMUSCULAR | Status: AC
  Administered 2021-12-20: 10 mg via INTRAVENOUS
  Filled 2021-12-20: qty 1

## 2021-12-20 MED ORDER — CYCLOBENZAPRINE HCL 10 MG PO TABS
5.0000 mg | ORAL_TABLET | Freq: Once | ORAL | Status: AC
  Administered 2021-12-20: 5 mg via ORAL
  Filled 2021-12-20: qty 1

## 2021-12-20 MED ORDER — METHYLPREDNISOLONE 4 MG PO TBPK: ORAL_TABLET | ORAL | 0 refills | Status: DC

## 2021-12-20 MED ORDER — HYDROMORPHONE HCL 1 MG/ML IJ SOLN
1.0000 mg | Freq: Once | INTRAMUSCULAR | Status: AC
  Administered 2021-12-20: 1 mg via INTRAMUSCULAR
  Filled 2021-12-20: qty 1

## 2021-12-20 MED ORDER — OXYCODONE-ACETAMINOPHEN 5-325 MG PO TABS
1.0000 | ORAL_TABLET | Freq: Once | ORAL | Status: AC
  Administered 2021-12-20: 1 via ORAL
  Filled 2021-12-20: qty 1

## 2021-12-20 NOTE — ED Notes (Signed)
Lying in fetal position on R side. Meds given. Alert, NAD, restless.

## 2021-12-20 NOTE — ED Triage Notes (Signed)
Pt reports chronic back pain to mid and left lower back that flared up last night.Pt has pain patches on at this time. No recent surgeries to back.

## 2021-12-20 NOTE — ED Notes (Signed)
3 person assist from w/c to stretcher. Lying flat in fetal position. Describes as pain, and spasm, radiation to leg. Pain patches in place. H/o same. EDP at [facility].

## 2021-12-20 NOTE — ED Provider Notes (Signed)
[facility] EMERGENCY DEPARTMENT Provider Note   CSN: [id number] Arrival date & time: 12/20/21  9675     History  Chief Complaint  Patient presents with   Back Pain    Matthew Garner is a 61 y.o. male.  HPI     This is a 61 year old male who presents with back pain.  He reports similar symptoms in the past.  He has a history of L4-L5 discectomy performed in 2004.  Since that time he has had intermittent episodes of sciatic type pain with pain the spasms through the back and down the left leg.  Patient states he had onset of spasms and pain that radiated down the left leg.  He states he took Tylenol and an over-the-counter patch without relief.  He states he had relief with steroid injections in the past.  Denies bowel or bladder incontinence.  Denies weakness of the lower extremities.  Reports 10 out of 10 pain and intense spasm over the back.  On chart review, he was admitted to the hospital for pain control in November 2021.  He was evaluated by neurosurgery at that time and had MRIs that did not show any impingement but persistent stenosis and disc disease.  He had no signs or symptoms of cauda equina at that time.  Home Medications Prior to Admission medications   Medication Sig Start Date End Date Taking? Authorizing Provider  cyclobenzaprine (FLEXERIL) 10 MG tablet Take 1 tablet (10 mg total) by mouth 2 (two) times daily as needed for muscle spasms. 12/20/21  Yes Arty Lantzy, Barbette Hair, MD  methylPREDNISolone (MEDROL DOSEPAK) 4 MG TBPK tablet Take as directed on packet 12/20/21  Yes Dymin Dingledine, Barbette Hair, MD  atorvastatin (LIPITOR) 10 MG tablet Take 1 tablet (10 mg total) by mouth daily at 6 PM. 12/22/18   Abby Potash, PA-C  glucose blood test strip 1 each by Other route as needed for other. Use as instructed 01/13/19   Abby Potash, PA-C  losartan (COZAAR) 50 MG tablet Take 1 tablet (50 mg total) by mouth every morning. 12/22/18   Abby Potash, PA-C   Menthol-Methyl Salicylate (MUSCLE RUB) 10-15 % CREA Apply 1 application topically as needed for muscle pain.    [provider]  metFORMIN (GLUCOPHAGE) 500 MG tablet Take 1 tablet (500 mg total) by mouth daily with breakfast. 12/22/18   Abby Potash, PA-C  omeprazole (PRILOSEC) 40 MG capsule Take 1 capsule (40 mg total) by mouth daily. 11/19/18   Abby Potash, PA-C  oxycodone (OXY-IR) 5 MG capsule Take 1 capsule (5 mg total) by mouth every 6 (six) hours as needed for up to 15 doses. Patient taking differently: Take 5 mg by mouth every 6 (six) hours as needed for pain.  11/12/19   Corena Herter, PA-C  sildenafil (VIAGRA) 100 MG tablet TAKE 1/2 TABLET BY MOUTH DAILY AS NEEDED FOR ERECTILE DYSFUNCTION. Patient taking differently: Take 50 mg by mouth as needed for erectile dysfunction.  01/13/19   Abby Potash, PA-C  traMADol (ULTRAM) 50 MG tablet Take 1 tablet (50 mg total) by mouth every 12 (twelve) hours as needed. 05/17/21   Aundra Dubin, PA-C      Allergies    Patient has no allergy information on record.    Review of Systems   Review of Systems  Genitourinary:  Negative for difficulty urinating and dysuria.  Musculoskeletal:  Positive for back pain.  All other systems reviewed and are negative.   Physical Exam Updated Vital Signs  BP 107/74 (BP Location: Left Arm)   Pulse 65   Temp 98 F (36.7 C) (Oral)   Resp 17   Ht 1.626 m ('5\' 4"'$ )   Wt 118 kg   SpO2 95%   BMI 44.65 kg/m  Physical Exam Vitals and nursing note reviewed.  Constitutional:      Appearance: He is well-developed. He is obese.     Comments: Uncomfortable appearing but nontoxic, lying on side, tearful  HENT:     Head: Normocephalic and atraumatic.  Eyes:     Pupils: Pupils are equal, round, and reactive to light.  Cardiovascular:     Rate and Rhythm: Normal rate and regular rhythm.     Heart sounds: Normal heart sounds. No murmur heard. Pulmonary:     Effort: Pulmonary effort is  normal. No respiratory distress.     Breath sounds: Normal breath sounds. No wheezing.  Abdominal:     General: There is no distension.     Palpations: Abdomen is soft.     Tenderness: There is no abdominal tenderness. There is no rebound.  Musculoskeletal:     Cervical back: Neck supple.     Comments: Positive left straight leg raise, tenderness palpation mid lower lumbar spine into the bilateral paraspinous musculature and left buttock  Lymphadenopathy:     Cervical: No cervical adenopathy.  Skin:    General: Skin is warm and dry.  Neurological:     Mental Status: He is alert and oriented to person, place, and time.     Comments: 5 out of 5 strength bilateral lower extremities, normal reflexes, no clonus  Psychiatric:        Mood and Affect: Mood normal.     ED Results / Procedures / Treatments   Labs (all labs ordered are listed, but only abnormal results are displayed) Labs Reviewed - No data to display  EKG None  Radiology No results found.  Procedures Procedures    Medications Ordered in ED Medications  dexamethasone (DECADRON) injection 10 mg (10 mg Intravenous Given 12/20/21 0913)  ketorolac (TORADOL) 30 MG/ML injection 30 mg (30 mg Intramuscular Given 12/20/21 0915)  oxyCODONE-acetaminophen (PERCOCET/ROXICET) 5-325 MG per tablet 1 tablet (1 tablet Oral Given 12/20/21 0915)  cyclobenzaprine (FLEXERIL) tablet 5 mg (5 mg Oral Given 12/20/21 0915)  HYDROmorphone (DILAUDID) injection 1 mg (1 mg Intramuscular Given 12/20/21 1046)    ED Course/ Medical Decision Making/ A&P                           Medical Decision Making Risk Prescription drug management.   This patient presents to the ED for concern of back pain, this involves an extensive number of treatment options, and is a complaint that carries with it a high risk of complications and morbidity.  I considered the following differential and admission for this acute, potentially life threatening condition.   The differential diagnosis includes sciatica, MSK etiology, kidney stone, no s/s cauda equina  MDM:    61 yo patient who presents with back pain.  Hx of the same.  Hx consistent with sciatica.  No s/s cauda equina and neurologically intact.  Prior MRI reviewed.  Do not feel he needs additional imaging at this time.  Given toradol, decadron, dilaudid.  On recheck reports some improvement.  Objectively more comfortable.  DIscussed medrol dosepack and flexeril.  F/U with neurosurgery.  F/U with PCP for referral to pain management at his request.  (Labs, imaging,  consults)  Labs: I Ordered, and personally interpreted labs.  The pertinent results include:  none  Imaging Studies ordered: I ordered imaging studies including none I independently visualized and interpreted imaging. I agree with the radiologist interpretation  Additional history obtained from chart review.  External records from outside source obtained and reviewed including [place] notes  Cardiac Monitoring: The patient was maintained on a cardiac monitor.  I personally viewed and interpreted the cardiac monitored which showed an underlying rhythm of: sinus rhythm  Reevaluation: After the interventions noted above, I reevaluated the patient and found that they have :improved  Social Determinants of Health: Live independently  Disposition:  d/c  Co morbidities that complicate the patient evaluation  Past Medical History:  Diagnosis Date   Allergy    environmental   Anemia    Back pain    Diabetes mellitus    Environmental allergies 11/27/2011   GERD (gastroesophageal reflux disease)    Hyperlipidemia    Hypertension    Joint pain    Knee pain, bilateral    Sebaceous cyst 11/10/2012   Snores      Medicines Meds ordered this encounter  Medications   dexamethasone (DECADRON) injection 10 mg   ketorolac (TORADOL) 30 MG/ML injection 30 mg   oxyCODONE-acetaminophen (PERCOCET/ROXICET) 5-325 MG per tablet 1 tablet    cyclobenzaprine (FLEXERIL) tablet 5 mg   HYDROmorphone (DILAUDID) injection 1 mg   methylPREDNISolone (MEDROL DOSEPAK) 4 MG TBPK tablet    Sig: Take as directed on packet    Dispense:  21 tablet    Refill:  0   cyclobenzaprine (FLEXERIL) 10 MG tablet    Sig: Take 1 tablet (10 mg total) by mouth 2 (two) times daily as needed for muscle spasms.    Dispense:  20 tablet    Refill:  0    I have reviewed the patients home medicines and have made adjustments as needed  Problem List / ED Course: Problem List Items Addressed This Visit   None Visit Diagnoses     Sciatica of left side    -  Primary   Relevant Medications   cyclobenzaprine (FLEXERIL) tablet 5 mg (Completed)   cyclobenzaprine (FLEXERIL) 10 MG tablet                   Final Clinical Impression(s) / ED Diagnoses Final diagnoses:  Sciatica of left side    Rx / DC Orders ED Discharge Orders          Ordered    methylPREDNISolone (MEDROL DOSEPAK) 4 MG TBPK tablet        12/20/21 1130    cyclobenzaprine (FLEXERIL) 10 MG tablet  2 times daily PRN        12/20/21 1130              Danah Reinecke, Barbette Hair, MD 12/20/21 1306

## 2021-12-20 NOTE — Discharge Instructions (Addendum)
Take meds as prescribed.  Follow-up with neurosurgery regarding ongoing sciatica symptoms.  Follow-up with PCP for referral to pain management.

## 2021-12-20 NOTE — ED Notes (Signed)
Reports "pain is a little better, but not much", asking for a "cortisone injection and to see pain management here".

## 2022-09-25 ENCOUNTER — Other Ambulatory Visit: Payer: Self-pay

## 2022-09-25 DIAGNOSIS — Z125 Encounter for screening for malignant neoplasm of prostate: Secondary | ICD-10-CM

## 2022-09-26 ENCOUNTER — Other Ambulatory Visit: Payer: Self-pay | Admitting: *Deleted

## 2022-09-26 DIAGNOSIS — Z125 Encounter for screening for malignant neoplasm of prostate: Secondary | ICD-10-CM

## 2022-09-26 NOTE — Progress Notes (Signed)
Patient: Matthew Garner           Date of Birth: 1960/06/18           MRN: [id number] Visit Date: 09/26/2022 PCP: System, Provider Not In  Prostate Cancer Screening Date of last physical exam:  (3 months ago) Date of last rectal exam:  (None) Have you ever had any of the following?: None Have you ever had or been told you have an allergy to latex products?: No Are you currently taking any natural prostate preparations?: No Are you currently experiencing any urinary symptoms?: Yes If yes, please explain::  (Urinary incontinent and urgency)  Prostate Exam Exam not completed. PSA blood test only completed.  Patient's History Patient Active Problem List   Diagnosis Date Noted   Body mass index 40.0-44.9, adult (HCC) 05/18/2021   Morbid obesity (HCC) 05/18/2021   Intractable low back pain 11/13/2019   Paresthesia of left leg 11/13/2019   Diabetes mellitus type 2, controlled (HCC) 11/13/2019   Type 2 diabetes mellitus without complication, without long-term current use of insulin (HCC) 03/04/2017   Vitamin D deficiency 03/04/2017   Other fatigue 02/04/2017   Shortness of breath on exertion 02/04/2017   Knee pain, bilateral 03/29/2015   Nonspecific abnormal electrocardiogram (ECG) (EKG) 04/21/2014   Spinal stenosis of lumbar region 10/27/2013   Pain of left calf 06/16/2013   Diabetes (HCC) 11/27/2011   Hyperlipidemia 11/27/2011   Hypertension 11/27/2011   Past Medical History:  Diagnosis Date   Allergy    environmental   Anemia    Back pain    Diabetes mellitus    Environmental allergies 11/27/2011   GERD (gastroesophageal reflux disease)    Hyperlipidemia    Hypertension    Joint pain    Knee pain, bilateral    Sebaceous cyst 11/10/2012   Snores     Family History  Problem Relation Age of Onset   Colon cancer Neg Hx    Colon polyps Neg Hx     Social History   Occupational History   Occupation: Works in Teaching laboratory technician  Tobacco Use   Smoking status: Never   Smokeless  tobacco: Never  Vaping Use   Vaping status: Never Used  Substance and Sexual Activity   Alcohol use: No   Drug use: No   Sexual activity: Not on file

## 2022-09-28 LAB — PSA: Prostate Specific Ag, Serum: 1.2 ng/mL (ref 0.0–4.0)

## 2023-08-28 ENCOUNTER — Other Ambulatory Visit: Payer: Self-pay

## 2023-08-28 ENCOUNTER — Emergency Department (HOSPITAL_COMMUNITY): Admission: EM | Admit: 2023-08-28 | Discharge: 2023-08-28 | Disposition: A | Discharge status: Home / Self Care

## 2023-08-28 DIAGNOSIS — Z7984 Long term (current) use of oral hypoglycemic drugs: Secondary | ICD-10-CM | POA: Diagnosis not present

## 2023-08-28 DIAGNOSIS — E119 Type 2 diabetes mellitus without complications: Secondary | ICD-10-CM | POA: Insufficient documentation

## 2023-08-28 DIAGNOSIS — M545 Low back pain, unspecified: Secondary | ICD-10-CM | POA: Insufficient documentation

## 2023-08-28 DIAGNOSIS — G8929 Other chronic pain: Secondary | ICD-10-CM | POA: Diagnosis not present

## 2023-08-28 DIAGNOSIS — Z79899 Other long term (current) drug therapy: Secondary | ICD-10-CM | POA: Insufficient documentation

## 2023-08-28 DIAGNOSIS — I10 Essential (primary) hypertension: Secondary | ICD-10-CM | POA: Diagnosis not present

## 2023-08-28 LAB — URINALYSIS, ROUTINE W REFLEX MICROSCOPIC
Bacteria, UA: NONE SEEN
Bilirubin Urine: NEGATIVE
Glucose, UA: >=500 mg/dL — AB
Hgb urine dipstick: NEGATIVE
Ketones, ur: NEGATIVE mg/dL
Leukocytes,Ua: NEGATIVE
Nitrite: NEGATIVE
Protein, ur: NEGATIVE mg/dL
Specific Gravity, Urine: 1.029 (ref 1.005–1.030)
pH: 5 (ref 5.0–8.0)

## 2023-08-28 MED ORDER — TRAMADOL HCL 50 MG PO TABS
50.0000 mg | ORAL_TABLET | Freq: Once | ORAL | Status: AC
  Administered 2023-08-28: 50 mg via ORAL
  Filled 2023-08-28: qty 1

## 2023-08-28 MED ORDER — HYDROMORPHONE HCL 1 MG/ML IJ SOLN
0.5000 mg | Freq: Once | INTRAMUSCULAR | Status: AC
  Administered 2023-08-28: 0.5 mg via INTRAVENOUS
  Filled 2023-08-28: qty 1

## 2023-08-28 MED ORDER — METHYLPREDNISOLONE 4 MG PO TBPK: ORAL_TABLET | ORAL | 0 refills | Status: AC

## 2023-08-28 MED ORDER — METHOCARBAMOL 500 MG PO TABS: 500.0000 mg | ORAL_TABLET | Freq: Three times a day (TID) | ORAL | 0 refills | Status: AC | PRN

## 2023-08-28 MED ORDER — LIDOCAINE 5 % EX PTCH
1.0000 | MEDICATED_PATCH | CUTANEOUS | Status: DC
  Administered 2023-08-28: 1 via TRANSDERMAL
  Filled 2023-08-28: qty 1

## 2023-08-28 MED ORDER — KETOROLAC TROMETHAMINE 15 MG/ML IJ SOLN
15.0000 mg | Freq: Once | INTRAMUSCULAR | Status: AC
  Administered 2023-08-28: 15 mg via INTRAVENOUS
  Filled 2023-08-28: qty 1

## 2023-08-28 MED ORDER — DEXAMETHASONE SODIUM PHOSPHATE 10 MG/ML IJ SOLN
10.0000 mg | Freq: Once | INTRAMUSCULAR | Status: AC
  Administered 2023-08-28: 10 mg via INTRAVENOUS
  Filled 2023-08-28: qty 1

## 2023-08-28 MED ORDER — METHOCARBAMOL 500 MG PO TABS
500.0000 mg | ORAL_TABLET | Freq: Once | ORAL | Status: AC
  Administered 2023-08-28: 500 mg via ORAL
  Filled 2023-08-28: qty 1

## 2023-08-28 NOTE — ED Notes (Signed)
 Back pain started yesterday, states he has had this before and lifts heavy for work. Denies numbness/tingling/urinary sx

## 2023-08-28 NOTE — ED Triage Notes (Signed)
 Patient arrives with back pain. Patient friend dropped him off. Patient was escorted to triage using a wheelchair.

## 2023-08-28 NOTE — Discharge Instructions (Signed)
 Alternate ice and heat to areas of injury 3-4 times per day to limit inflammation and spasm.  Avoid strenuous activity and heavy lifting.  We recommend consistent use of a Medrol  dose pack in addition to Robaxin  for muscle spasms.  Do not drive or drink alcohol after taking Robaxin  as it may make you drowsy and impair your judgment.  We recommend follow-up with a primary care doctor to ensure resolution of symptoms.  Return to the ED for any new or concerning symptoms.

## 2023-08-28 NOTE — ED Provider Notes (Signed)
 [place] EMERGENCY DEPARTMENT AT [facility] Provider Note   CSN: [id number] Arrival date & time: 08/28/23  9359     Patient presents with: Back Pain   Matthew Garner is a 63 y.o. male.    63 year old male with a history of diabetes, hyperlipidemia, hypertension, chronic back and knee pain presents to the emergency department for evaluation of back pain.  He states that he has back pain all the time, but pain has been worse yesterday evening into this morning.  Primarily in the left low back and radiates down BLE.  It is aggravated with any degree of movement or ambulation.  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  Arrives wearing his back brace.  Last had epidural thoracolumbar injections with [facility] pain management in March, per chart review.  He has been using Tylenol  and ibuprofen  for pain control without relief.  States that he occasionally requires the use of tramadol  when his pain is more severe. No fall, trauma, genital numbness, other extremity numbness or paresthesias, bowel/bladder incontinence, fever.  The history is provided by the patient. No language interpreter was used.  Back Pain      Prior to Admission medications   Medication Sig Start Date End Date Taking? Authorizing Provider  methocarbamol  (ROBAXIN ) 500 MG tablet Take 1 tablet (500 mg total) by mouth every 8 (eight) hours as needed for muscle spasms. 08/28/23  Yes Keith Sor, PA-C  atorvastatin  (LIPITOR) 10 MG tablet Take 1 tablet (10 mg total) by mouth daily at 6 PM. 12/22/18   Therisa Arabia, PA-C  glucose blood test strip 1 each by Other route as needed for other. Use as instructed 01/13/19   Therisa Arabia, PA-C  losartan  (COZAAR ) 50 MG tablet Take 1 tablet (50 mg total) by mouth every morning. 12/22/18   Therisa Arabia, PA-C  Menthol-Methyl Salicylate (MUSCLE RUB) 10-15 % CREA Apply 1 application topically as needed for muscle pain.    [provider]   metFORMIN  (GLUCOPHAGE ) 500 MG tablet Take 1 tablet (500 mg total) by mouth daily with breakfast. 12/22/18   Therisa Arabia, PA-C  methylPREDNISolone  (MEDROL  DOSEPAK) 4 MG TBPK tablet Take as directed on packet 08/28/23   Keith Sor, PA-C  omeprazole  (PRILOSEC) 40 MG capsule Take 1 capsule (40 mg total) by mouth daily. 11/19/18   Therisa Arabia, PA-C  oxycodone  (OXY-IR) 5 MG capsule Take 1 capsule (5 mg total) by mouth every 6 (six) hours as needed for up to 15 doses. Patient taking differently: Take 5 mg by mouth every 6 (six) hours as needed for pain.  11/12/19   Landy Honora CROME, PA-C  sildenafil  (VIAGRA ) 100 MG tablet TAKE 1/2 TABLET BY MOUTH DAILY AS NEEDED FOR ERECTILE DYSFUNCTION. Patient taking differently: Take 50 mg by mouth as needed for erectile dysfunction.  01/13/19   Therisa Arabia, PA-C  traMADol  (ULTRAM ) 50 MG tablet Take 1 tablet (50 mg total) by mouth every 12 (twelve) hours as needed. 05/17/21   Jule Ronal CROME, PA-C    Allergies: Patient has no allergy information on record.    Review of Systems  Musculoskeletal:  Positive for back pain.  Ten systems reviewed and are negative for acute change, except as noted in the HPI.    Updated Vital Signs BP 129/68 (BP Location: Right Arm)   Pulse 69   Temp 98.2 F (36.8 C) (Oral)   Resp 20   SpO2 98%   Physical Exam Vitals and nursing note reviewed.  Constitutional:      General: He is not in acute distress.    Appearance: He is well-developed. He is not diaphoretic.     Comments: Appears uncomfortable  HENT:     Head: Normocephalic and atraumatic.  Eyes:     General: No scleral icterus.    Conjunctiva/sclera: Conjunctivae normal.  Cardiovascular:     Rate and Rhythm: Normal rate and regular rhythm.     Pulses: Normal pulses.     Comments: Intact distal pulses Pulmonary:     Effort: Pulmonary effort is normal. No respiratory distress.     Comments: Respirations even and unlabored Musculoskeletal:         General: Normal range of motion.     Cervical back: Normal range of motion.  Skin:    General: Skin is warm and dry.     Coloration: Skin is not pale.     Findings: No erythema or rash.  Neurological:     Mental Status: He is alert and oriented to person, place, and time.     Coordination: Coordination normal.     Comments: GCS 15. Speech is goal oriented. Sensation to light touch intact. Patient moves extremities without ataxia. Is able to assist self to bed and achor feet on mattress to push himself up toward the head of the bed.  Psychiatric:        Behavior: Behavior normal.     (all labs ordered are listed, but only abnormal results are displayed) Labs Reviewed  URINALYSIS, ROUTINE W REFLEX MICROSCOPIC - Abnormal; Notable for the following components:      Result Value   Glucose, UA >=500 (*)    All other components within normal limits    EKG: None  Radiology: No results found.   Procedures   Medications Ordered in the ED  lidocaine  (LIDODERM ) 5 % 1 patch (1 patch Transdermal Patch Applied 08/28/23 0723)  ketorolac  (TORADOL ) 15 MG/ML injection 15 mg (15 mg Intravenous Given 08/28/23 0723)  HYDROmorphone  (DILAUDID ) injection 0.5 mg (0.5 mg Intravenous Given 08/28/23 0716)  traMADol  (ULTRAM ) tablet 50 mg (50 mg Oral Given 08/28/23 0839)  dexamethasone  (DECADRON ) injection 10 mg (10 mg Intravenous Given 08/28/23 0839)  methocarbamol  (ROBAXIN ) tablet 500 mg (500 mg Oral Given 08/28/23 0839)    Clinical Course as of 08/28/23 0909  Wed Aug 28, 2023  0701 Per chart review, followed by pain management at [facility] for back and b/l knee pain. Had epidural injection for back pain last in March 2025.  [KH]  0702 2021 MRI IMPRESSION:  1. Multilevel degenerative changes of the lumbar spine with moderate  canal stenosis at L3-L4, slightly progressed from prior.  2. Moderate-to-severe left foraminal stenosis at L5-S1. This finding  may contribute to patient's left-sided radicular  symptoms.  3. Soft tissue edema adjacent to the bilateral facet joints at  L4-L5, which can be a source of back pain.  4. Unremarkable MRI of the sacrum. No evidence of sacroiliitis.   [KH]  [phone] States that he occasionally requires the use of tramadol  when his pain is more severe. This was last prescribed to the patient in 2023; PDMP review shows no recent Rx for narcotics  [KH]  0824 UA unremarkable. Patient's pain has improved on reassessment. Will trial ambulation in the department. [KH]  0908 Spoke with patient at bedside prior to discharge regarding the need to monitor blood sugar levels while taking prednisone . He verbalizes understanding. [KH]    Clinical Course User Index [KH] Keith Sor,  PA-C                                 Medical Decision Making Amount and/or Complexity of Data Reviewed Labs: ordered.  Risk Prescription drug management.   This patient presents to the ED for concern of back pain, this involves an extensive number of treatment options, and is a complaint that carries with it a high risk of complications and morbidity.  The differential diagnosis includes strain vs fracture vs radiculopathy vs kidney stone vs cauda equina   Co morbidities that complicate the patient evaluation  Obesity Chronic pain DM HLD HTN   Additional history obtained:  External records from outside source obtained and reviewed including lumbar MRI from 2021.   Lab Tests:  I Ordered, and personally interpreted labs.  The pertinent results include:  UA which is unremarkable.   Cardiac Monitoring:  The patient was maintained on a cardiac monitor.  I personally viewed and interpreted the cardiac monitored which showed an underlying rhythm of: NSR   Medicines ordered and prescription drug management:  I ordered medication including Toradol , Dilaudid , Decadron , Robaxin  for back pain  Reevaluation of the patient after these medicines showed that the patient improved I  have reviewed the patients home medicines and have made adjustments as needed   Test Considered:  DG lumbar spine - felt low yield. MRI from 2021 reviewed.   Problem List / ED Course:  As above Patient with back pain. Hx of same, followed by pain management. No recent trauma or injury. Neurovascularly intact on exam. Patient can walk but states is painful. No loss of bowel or bladder control. No concern for cauda equina.   Symptomatic improvement in the ED with medications. Ambulatory. Stable for ongoing outpatient f/u.    Reevaluation:  After the interventions noted above, I reevaluated the patient and found that they have :improved   Social Determinants of Health:  Language barrier, mild.   Dispostion:  After consideration of the diagnostic results and the patients response to treatment, I feel that the patent would benefit from ongoing f/u with PCP and/or pain management for acute exacerbation of chronic back pain. Is a diabetic, but fairly well controlled. Has been on Medrol  dose pack in the past. Will represcribe today along with Robaxin . Return precautions discussed and provided. Patient discharged in stable condition with no unaddressed concerns.       Final diagnoses:  Acute exacerbation of chronic low back pain    ED Discharge Orders          Ordered    methylPREDNISolone  (MEDROL  DOSEPAK) 4 MG TBPK tablet        08/28/23 0852    methocarbamol  (ROBAXIN ) 500 MG tablet  Every 8 hours PRN        08/28/23 0852               Keith Sor, PA-C 08/28/23 9090    Neysa Caron PARAS, DO 08/28/23 1506

## 2023-08-28 NOTE — ED Notes (Signed)
 This RN reviewed discharge instructions with patient. He  verbalized understanding and denied any further questions. PT well appearing upon discharge and reports tolerable 7/10 back pain. Pt wheeled out to lobby and endorses ride home.

## 2023-08-31 ENCOUNTER — Other Ambulatory Visit: Payer: Self-pay

## 2023-08-31 ENCOUNTER — Emergency Department (HOSPITAL_COMMUNITY)
Admission: EM | Admit: 2023-08-31 | Discharge: 2023-08-31 | Disposition: A | Discharge status: Home / Self Care | Attending: Emergency Medicine | Admitting: Emergency Medicine

## 2023-08-31 ENCOUNTER — Emergency Department (HOSPITAL_COMMUNITY)

## 2023-08-31 DIAGNOSIS — I10 Essential (primary) hypertension: Secondary | ICD-10-CM | POA: Insufficient documentation

## 2023-08-31 DIAGNOSIS — Z79899 Other long term (current) drug therapy: Secondary | ICD-10-CM | POA: Insufficient documentation

## 2023-08-31 DIAGNOSIS — E1165 Type 2 diabetes mellitus with hyperglycemia: Secondary | ICD-10-CM | POA: Diagnosis not present

## 2023-08-31 DIAGNOSIS — M545 Low back pain, unspecified: Secondary | ICD-10-CM | POA: Diagnosis present

## 2023-08-31 DIAGNOSIS — M5441 Lumbago with sciatica, right side: Secondary | ICD-10-CM | POA: Insufficient documentation

## 2023-08-31 DIAGNOSIS — G8929 Other chronic pain: Secondary | ICD-10-CM | POA: Insufficient documentation

## 2023-08-31 DIAGNOSIS — Z7984 Long term (current) use of oral hypoglycemic drugs: Secondary | ICD-10-CM | POA: Diagnosis not present

## 2023-08-31 LAB — URINALYSIS, ROUTINE W REFLEX MICROSCOPIC
Bacteria, UA: NONE SEEN
Bilirubin Urine: NEGATIVE
Glucose, UA: >=500 mg/dL — AB
Hgb urine dipstick: NEGATIVE
Ketones, ur: 20 mg/dL — AB
Leukocytes,Ua: NEGATIVE
Nitrite: NEGATIVE
Protein, ur: NEGATIVE mg/dL
Specific Gravity, Urine: 1.021 (ref 1.005–1.030)
pH: 7 (ref 5.0–8.0)

## 2023-08-31 LAB — COMPREHENSIVE METABOLIC PANEL WITH GFR
ALT: 50 U/L — ABNORMAL HIGH (ref 0–44)
AST: 32 U/L (ref 15–41)
Albumin: 4.1 g/dL (ref 3.5–5.0)
Alkaline Phosphatase: 77 U/L (ref 38–126)
Anion gap: 15 (ref 5–15)
BUN: 17 mg/dL (ref 6–20)
CO2: 22 mmol/L (ref 22–32)
Calcium: 9.6 mg/dL (ref 8.9–10.3)
Chloride: 98 mmol/L (ref 98–111)
Creatinine, Ser: 0.86 mg/dL (ref 0.61–1.24)
GFR, Estimated: >60 mL/min (ref >60)
Glucose, Bld: 213 mg/dL — ABNORMAL HIGH (ref 70–99)
Potassium: 4.2 mmol/L (ref 3.5–5.1)
Sodium: 135 mmol/L (ref 135–145)
Total Bilirubin: 0.9 mg/dL (ref 0.0–1.2)
Total Protein: 7.5 g/dL (ref 6.5–8.1)

## 2023-08-31 LAB — CBC WITH DIFFERENTIAL/PLATELET
Abs Immature Granulocytes: 0.03 K/uL (ref 0.00–0.07)
Basophils Absolute: 0.1 K/uL (ref 0.0–0.1)
Basophils Relative: 1 %
Eosinophils Absolute: 0 K/uL (ref 0.0–0.5)
Eosinophils Relative: 0 %
HCT: 46.2 % (ref 39.0–52.0)
Hemoglobin: 16 g/dL (ref 13.0–17.0)
Immature Granulocytes: 0 %
Lymphocytes Relative: 20 %
Lymphs Abs: 1.5 K/uL (ref 0.7–4.0)
MCH: 29.6 pg (ref 26.0–34.0)
MCHC: 34.6 g/dL (ref 30.0–36.0)
MCV: 85.4 fL (ref 80.0–100.0)
Monocytes Absolute: 0.3 K/uL (ref 0.1–1.0)
Monocytes Relative: 4 %
Neutro Abs: 5.7 K/uL (ref 1.7–7.7)
Neutrophils Relative %: 75 %
Platelets: 313 K/uL (ref 150–400)
RBC: 5.41 MIL/uL (ref 4.22–5.81)
RDW: 12.3 % (ref 11.5–15.5)
WBC: 7.6 K/uL (ref 4.0–10.5)
nRBC: 0 % (ref 0.0–0.2)

## 2023-08-31 MED ORDER — OXYCODONE HCL 5 MG PO TABS
5.0000 mg | ORAL_TABLET | Freq: Four times a day (QID) | ORAL | 0 refills | Status: AC | PRN
Start: 2023-08-31 — End: ?

## 2023-08-31 MED ORDER — KETOROLAC TROMETHAMINE 15 MG/ML IJ SOLN
15.0000 mg | Freq: Once | INTRAMUSCULAR | Status: AC
  Administered 2023-08-31: 15 mg via INTRAVENOUS
  Filled 2023-08-31: qty 1

## 2023-08-31 MED ORDER — OXYCODONE-ACETAMINOPHEN 5-325 MG PO TABS
1.0000 | ORAL_TABLET | Freq: Once | ORAL | Status: AC
  Administered 2023-08-31: 1 via ORAL
  Filled 2023-08-31: qty 1

## 2023-08-31 MED ORDER — ONDANSETRON HCL 4 MG/2ML IJ SOLN
4.0000 mg | Freq: Once | INTRAMUSCULAR | Status: AC
Start: 2023-08-31 — End: 2023-08-31
  Administered 2023-08-31: 4 mg via INTRAVENOUS
  Filled 2023-08-31: qty 2

## 2023-08-31 MED ORDER — SODIUM CHLORIDE 0.9 % IV BOLUS
1000.0000 mL | Freq: Once | INTRAVENOUS | Status: AC
  Administered 2023-08-31: 1000 mL via INTRAVENOUS

## 2023-08-31 NOTE — ED Notes (Signed)
 Patient transported to CT

## 2023-08-31 NOTE — ED Triage Notes (Signed)
 Patient reports persistent pain at lower back radiating to bilateral groin onset last week , denies injury /no hematuria .

## 2023-08-31 NOTE — Discharge Instructions (Signed)
 We evaluated you for your back pain.  Your testing in the emergency department is reassuring.  We did not see any dangerous cause of your symptoms today.  Please follow-up with your orthopedic doctor for further care.  Please take Tylenol  (acetaminophen ) and Motrin  (ibuprofen ) for your symptoms at home.  You can take 1000 mg of Tylenol  every 6 hours and 600 mg of Motrin  every 6 hours as needed for your symptoms.  You can take these medicines together as needed, either at the same time, or alternating every 3 hours.  We have given you a small amount of oxycodone  which you can take for pain not relieved by Tylenol  and Motrin .  Do not drink alcohol, drive, or operate machinery when taking this medicine.  Please return if you have any new or worsening symptoms such as trouble walking, bowel or bladder incontinence, weakness in your legs, fevers or chills, worsening or uncontrolled pain, or any other new symptoms

## 2023-08-31 NOTE — ED Notes (Signed)
 Pt states he is extremely cold. HR is 39 at this time and pt is pale. He is having pain 10/10. Pt moved to room and placed on monitor. EKG done and EDP [place] advised.

## 2023-08-31 NOTE — ED Provider Notes (Signed)
  EMERGENCY DEPARTMENT AT [facility] Provider Note  CSN: [id number] Arrival date & time: 08/31/23 9557  Chief Complaint(s) Back Pain  HPI Matthew Garner is a 63 y.o. male history of chronic back pain, obesity, diabetes, hypertension, hyperlipidemia presenting with back pain.  Patient reports chronic low back pain.  He reports that he has had increased pain for the past few days, which has been persistent.  Reports that this feels similar to his chronic back pain.  Is just worse than normal.  He is taking Tylenol  and Motrin  without much improvement.  Came to the ER recently and received tramadol  but this does not also help much.  Reports that radiates down the right leg and into his abdomen.  No fevers or chills.  No weakness or trouble walking.  No bowel or bladder incontinence.  No history of IV drug use.  No trauma to the back.  No urinary symptoms.   Past Medical History Past Medical History:  Diagnosis Date   Allergy    environmental   Anemia    Back pain    Diabetes mellitus    Environmental allergies 11/27/2011   GERD (gastroesophageal reflux disease)    Hyperlipidemia    Hypertension    Joint pain    Knee pain, bilateral    Sebaceous cyst 11/10/2012   Snores    Patient Active Problem List   Diagnosis Date Noted   Body mass index 40.0-44.9, adult (HCC) 05/18/2021   Morbid obesity (HCC) 05/18/2021   Intractable low back pain 11/13/2019   Paresthesia of left leg 11/13/2019   Diabetes mellitus type 2, controlled (HCC) 11/13/2019   Type 2 diabetes mellitus without complication, without long-term current use of insulin  (HCC) 03/04/2017   Vitamin D  deficiency 03/04/2017   Other fatigue 02/04/2017   Shortness of breath on exertion 02/04/2017   Knee pain, bilateral 03/29/2015   Nonspecific abnormal electrocardiogram (ECG) (EKG) 04/21/2014   Spinal stenosis of lumbar region 10/27/2013   Pain of left calf 06/16/2013   Diabetes (HCC) 11/27/2011   Hyperlipidemia  11/27/2011   Hypertension 11/27/2011   Home Medication(s) Prior to Admission medications   Medication Sig Start Date End Date Taking? Authorizing Provider  atorvastatin  (LIPITOR) 10 MG tablet Take 1 tablet (10 mg total) by mouth daily at 6 PM. 12/22/18   Therisa Arabia, PA-C  glucose blood test strip 1 each by Other route as needed for other. Use as instructed 01/13/19   Therisa Arabia, PA-C  losartan  (COZAAR ) 50 MG tablet Take 1 tablet (50 mg total) by mouth every morning. 12/22/18   Therisa Arabia, PA-C  Menthol-Methyl Salicylate (MUSCLE RUB) 10-15 % CREA Apply 1 application topically as needed for muscle pain.    [provider]  metFORMIN  (GLUCOPHAGE ) 500 MG tablet Take 1 tablet (500 mg total) by mouth daily with breakfast. 12/22/18   Therisa Arabia, PA-C  methocarbamol  (ROBAXIN ) 500 MG tablet Take 1 tablet (500 mg total) by mouth every 8 (eight) hours as needed for muscle spasms. 08/28/23   Keith Sor, PA-C  methylPREDNISolone  (MEDROL  DOSEPAK) 4 MG TBPK tablet Take as directed on packet 08/28/23   Keith Sor, PA-C  omeprazole  (PRILOSEC) 40 MG capsule Take 1 capsule (40 mg total) by mouth daily. 11/19/18   Therisa Arabia, PA-C  oxycodone  (OXY-IR) 5 MG capsule Take 1 capsule (5 mg total) by mouth every 6 (six) hours as needed for up to 15 doses. Patient taking differently: Take 5 mg by mouth every 6 (six) hours as  needed for pain.  11/12/19   Landy Honora CROME, PA-C  sildenafil  (VIAGRA ) 100 MG tablet TAKE 1/2 TABLET BY MOUTH DAILY AS NEEDED FOR ERECTILE DYSFUNCTION. Patient taking differently: Take 50 mg by mouth as needed for erectile dysfunction.  01/13/19   Therisa Arabia, PA-C  traMADol  (ULTRAM ) 50 MG tablet Take 1 tablet (50 mg total) by mouth every 12 (twelve) hours as needed. 05/17/21   Jule Ronal CROME, PA-C                                                                                                                                    Past Surgical History Past Surgical  History:  Procedure Laterality Date   APPENDECTOMY     KNEE SURGERY     right   MASS EXCISION Bilateral 11/24/2012   Procedure: EXCISION SCALP MASS X2;  Surgeon: Vicenta DELENA Poli, MD;  Location:  S[facility];  Service: General;  Laterality: Bilateral;   SPINE SURGERY  2004   lumb   UPPER GASTROINTESTINAL ENDOSCOPY     Family History Family History  Problem Relation Age of Onset   Colon cancer Neg Hx    Colon polyps Neg Hx     Social History Social History   Tobacco Use   Smoking status: Never   Smokeless tobacco: Never  Vaping Use   Vaping status: Never Used  Substance Use Topics   Alcohol use: No   Drug use: No   Allergies Patient has no known allergies.  Review of Systems Review of Systems  All other systems reviewed and are negative.   Physical Exam Vital Signs  I have reviewed the triage vital signs BP (!) 107/59   Pulse (!) 44   Temp 97.9 F (36.6 C)   Resp 15   SpO2 99%  Physical Exam Vitals and nursing note reviewed.  Constitutional:      General: He is not in acute distress.    Appearance: Normal appearance.  HENT:     Mouth/Throat:     Mouth: Mucous membranes are moist.  Eyes:     Conjunctiva/sclera: Conjunctivae normal.  Cardiovascular:     Rate and Rhythm: Normal rate and regular rhythm.  Pulmonary:     Effort: Pulmonary effort is normal. No respiratory distress.     Breath sounds: Normal breath sounds.  Abdominal:     General: Abdomen is flat.     Palpations: Abdomen is soft.     Tenderness: There is no abdominal tenderness. There is no right CVA tenderness or left CVA tenderness.  Musculoskeletal:     Right lower leg: No edema.     Left lower leg: No edema.     Comments: No midline C, T, L-spine tenderness.  Mild paraspinal lumbar tenderness bilaterally  Skin:    General: Skin is warm and dry.     Capillary Refill: Capillary refill takes less than 2 seconds.  Neurological:  Mental Status: He is alert and oriented  to person, place, and time. Mental status is at baseline.     Comments: Strength 5 out of 5 in the bilateral lower extremities, no sensory deficit.  Psychiatric:        Mood and Affect: Mood normal.        Behavior: Behavior normal.     ED Results and Treatments Labs (all labs ordered are listed, but only abnormal results are displayed) Labs Reviewed  COMPREHENSIVE METABOLIC PANEL WITH GFR - Abnormal; Notable for the following components:      Result Value   Glucose, Bld 213 (*)    ALT 50 (*)    All other components within normal limits  URINALYSIS, ROUTINE W REFLEX MICROSCOPIC - Abnormal; Notable for the following components:   Glucose, UA >=500 (*)    Ketones, ur 20 (*)    All other components within normal limits  CBC WITH DIFFERENTIAL/PLATELET                                                                                                                          Radiology CT Renal Stone Study Result Date: 08/31/2023 EXAM: CT ABDOMEN AND PELVIS WITHOUT CONTRAST 08/31/2023 08:37:00 AM TECHNIQUE: CT of the abdomen and pelvis was performed without the administration of intravenous contrast. Multiplanar reformatted images are provided for review. Automated exposure control, iterative reconstruction, and/or weight-based adjustment of the mA/kV was utilized to reduce the radiation dose to as low as reasonably achievable. COMPARISON: 05/25/2021 CLINICAL HISTORY: Abdominal/flank pain, stone suspected. Pt has flank pain. FINDINGS: LOWER CHEST: Unchanged appearance of small lung nodules in the posterior right base, left lower lobe and Lingula. The largest is in the Lingula measuring 5 mm. No follow up imaging recommended. LIVER: Hepatic steatosis. Squaring of the caudate lobe of liver and relative hypertrophy of the lateral segment. These findings may reflect early cirrhosis. No focal suspicious liver lesion. GALLBLADDER AND BILE DUCTS: No wall thickening. No cholelithiasis. No biliary ductal  dilatation. SPLEEN: Normal size. No focal lesion. PANCREAS: No mass. No ductal dilatation. ADRENAL GLANDS: Normal appearance. No mass. KIDNEYS, URETERS AND BLADDER: No stones in the kidneys or ureters. No hydronephrosis. No perinephric or periureteral stranding. Urinary bladder is unremarkable. GI AND BOWEL: Small hiatal hernia. No pathologic dilatation of the bowel loops. No signs of bowel inflammation. Scattered colonic diverticula without signs of acute diverticulitis. Status post appendectomy. PERITONEUM AND RETROPERITONEUM: No ascites. No free air. VASCULATURE: Aorta is normal in caliber. LYMPH NODES: No lymphadenopathy. REPRODUCTIVE ORGANS: Prostate gland appears enlarged. BONES AND SOFT TISSUES: Lumbar spondylosis. No acute osseous abnormality. No focal soft tissue abnormality. IMPRESSION: 1. No acute findings in the abdomen or pelvis. 2. Hepatic steatosis and findings suggestive of early cirrhosis. No focal suspicious liver lesion. 3. Small hiatal hernia. 4. Scattered colonic diverticula without signs of acute diverticulitis. Electronically signed by: Waddell Calk MD 08/31/2023 08:53 AM EDT RP Workstation: [id number]   DG Lumbar Spine Complete Result Date:  08/31/2023 EXAM: 4 VIEW(S) XRAY OF THE LUMBAR SPINE 08/31/2023 06:18:14 AM COMPARISON: None available. CLINICAL HISTORY: Low back pain. FINDINGS: LUMBAR SPINE: BONES: No acute fracture. No aggressive appearing osseous lesion. Alignment is normal. DISCS AND DEGENERATIVE CHANGES: Multilevel disc space narrowing and endplate degenerative changes noted throughout the lumbar spine. SOFT TISSUES: Calcifications in the left hemiabdomen measure up to 5 mm. Indeterminate. Possible urinary tract calculi. IMPRESSION: 1. No acute abnormality of the lumbar spine. 2. Multilevel disc space narrowing and endplate degenerative changes throughout the lumbar spine. 3. Calcifications in the left hemiabdomen which may reflect kidney stones. Electronically signed by: Waddell Calk MD 08/31/2023 06:31 AM EDT RP Workstation: [id number]    Pertinent labs & imaging results that were available during my care of the patient were reviewed by me and considered in my medical decision making (see MDM for details).  Medications Ordered in ED Medications  oxyCODONE -acetaminophen  (PERCOCET/ROXICET) 5-325 MG per tablet 1 tablet (1 tablet Oral Given 08/31/23 0456)  ketorolac  (TORADOL ) 15 MG/ML injection 15 mg (15 mg Intravenous Given 08/31/23 0744)  ondansetron  (ZOFRAN ) injection 4 mg (4 mg Intravenous Given 08/31/23 0744)  sodium chloride  0.9 % bolus 1,000 mL (0 mLs Intravenous Stopped 08/31/23 0902)  ketorolac  (TORADOL ) 15 MG/ML injection 15 mg (15 mg Intravenous Given 08/31/23 1043)                                                                                                                                     Procedures Procedures  (including critical care time)  Medical Decision Making / ED Course   MDM:  63 year old presenting to the emergency department with back pain.  Patient overall well-appearing, physical examination with no focal abnormality other than some mild paraspinal tenderness.  Given radiation to the abdomen, considered other process such as nephrolithiasis or intra-abdominal process.  Obtained CT scan which shows no acute process.  Seems most consistent with patient's chronic back pain.  Recommended he follow-up with his outpatient provider.  He has been taking Tylenol  and Motrin  without relief.  He tried tramadol  but this has not helped.  Will give small amount of oxycodone  for few days given flare of pain.  Discussed ER return precautions.  Considered other process such as spinal cord compression, occult infection, cauda equina syndrome, physical examination is reassuring and history without red flags concerning for this.  He also reports the pain is similar to his chronic pain.  Will discharge patient to home. All questions answered. Patient  comfortable with plan of discharge. Return precautions discussed with patient and specified on the after visit summary.       Additional history obtained:  -External records from outside source obtained and reviewed including: Chart review including previous notes, labs, imaging, consultation notes including prior notes    Lab Tests: -I ordered, reviewed, and interpreted labs.   The pertinent results include:   Labs Reviewed  COMPREHENSIVE METABOLIC PANEL WITH  GFR - Abnormal; Notable for the following components:      Result Value   Glucose, Bld 213 (*)    ALT 50 (*)    All other components within normal limits  URINALYSIS, ROUTINE W REFLEX MICROSCOPIC - Abnormal; Notable for the following components:   Glucose, UA >=500 (*)    Ketones, ur 20 (*)    All other components within normal limits  CBC WITH DIFFERENTIAL/PLATELET    Notable for mild hyperglycemia   EKG   EKG Interpretation Date/Time:  Saturday August 31 2023 09:03:05 EDT Ventricular Rate:  43 PR Interval:  285 QRS Duration:  105 QT Interval:  506 QTC Calculation: 428 R Axis:   -12  Text Interpretation: Sinus bradycardia Prolonged PR interval Low voltage, precordial leads Confirmed by Francesca Fallow ([id number]) on 08/31/2023 10:03:12 AM         Imaging Studies ordered: I ordered imaging studies including CT abdomen pelvis  On my interpretation imaging demonstrates no acute process I independently visualized and interpreted imaging. I agree with the radiologist interpretation   Medicines ordered and prescription drug management: Meds ordered this encounter  Medications   oxyCODONE -acetaminophen  (PERCOCET/ROXICET) 5-325 MG per tablet 1 tablet    Refill:  0   ketorolac  (TORADOL ) 15 MG/ML injection 15 mg   ondansetron  (ZOFRAN ) injection 4 mg   sodium chloride  0.9 % bolus 1,000 mL   ketorolac  (TORADOL ) 15 MG/ML injection 15 mg    -I have reviewed the patients home medicines and have made adjustments as  needed    Cardiac Monitoring: The patient was maintained on a cardiac monitor.  I personally viewed and interpreted the cardiac monitored which showed an underlying rhythm of: sinus bradycardia   Social Determinants of Health:  Diagnosis or treatment significantly limited by social determinants of health: obesity   Reevaluation: After the interventions noted above, I reevaluated the patient and found that their symptoms have improved  Co morbidities that complicate the patient evaluation  Past Medical History:  Diagnosis Date   Allergy    environmental   Anemia    Back pain    Diabetes mellitus    Environmental allergies 11/27/2011   GERD (gastroesophageal reflux disease)    Hyperlipidemia    Hypertension    Joint pain    Knee pain, bilateral    Sebaceous cyst 11/10/2012   Snores       Dispostion: Disposition decision including need for hospitalization was considered, and patient discharged from emergency department.    Final Clinical Impression(s) / ED Diagnoses Final diagnoses:  Chronic bilateral low back pain with right-sided sciatica     This chart was dictated using voice recognition software.  Despite best efforts to proofread,  errors can occur which can change the documentation meaning.    Francesca Fallow CROME, MD 08/31/23 [phone]
